# Patient Record
Sex: Female | Born: 1980 | Race: Black or African American | Hispanic: No | Marital: Single | Smoking: Never smoker
Health system: Southern US, Community
[De-identification: ages and names within clinical notes are randomized; demographics above are authoritative.]

## PROBLEM LIST (undated history)

## (undated) DIAGNOSIS — E669 Obesity, unspecified: Secondary | ICD-10-CM

## (undated) DIAGNOSIS — E039 Hypothyroidism, unspecified: Secondary | ICD-10-CM

## (undated) DIAGNOSIS — F32A Depression, unspecified: Secondary | ICD-10-CM

## (undated) DIAGNOSIS — F329 Major depressive disorder, single episode, unspecified: Secondary | ICD-10-CM

## (undated) HISTORY — DX: Hypothyroidism, unspecified: E03.9

## (undated) HISTORY — DX: Major depressive disorder, single episode, unspecified: F32.9

## (undated) HISTORY — DX: Depression, unspecified: F32.A

## (undated) HISTORY — DX: Obesity, unspecified: E66.9

## (undated) HISTORY — PX: CHOLECYSTECTOMY: SHX55

---

## 2005-05-05 ENCOUNTER — Emergency Department (HOSPITAL_COMMUNITY): Admission: EM | Admit: 2005-05-05 | Discharge: 2005-05-05 | Payer: Self-pay | Admitting: Family Medicine

## 2008-10-29 ENCOUNTER — Ambulatory Visit: Payer: Self-pay | Admitting: Family Medicine

## 2008-10-29 DIAGNOSIS — E039 Hypothyroidism, unspecified: Secondary | ICD-10-CM | POA: Insufficient documentation

## 2008-10-31 ENCOUNTER — Telehealth (INDEPENDENT_AMBULATORY_CARE_PROVIDER_SITE_OTHER): Payer: Self-pay | Admitting: *Deleted

## 2008-10-31 LAB — CONVERTED CEMR LAB
Albumin: 4 g/dL (ref 3.5–5.2)
Alkaline Phosphatase: 43 units/L (ref 39–117)
BUN: 9 mg/dL (ref 6–23)
Basophils Relative: 0.5 % (ref 0.0–3.0)
Calcium: 8.9 mg/dL (ref 8.4–10.5)
Creatinine, Ser: 0.9 mg/dL (ref 0.4–1.2)
Eosinophils Relative: 2.3 % (ref 0.0–5.0)
GFR calc Af Amer: 97 mL/min
Glucose, Bld: 103 mg/dL — ABNORMAL HIGH (ref 70–99)
HCT: 35.8 % — ABNORMAL LOW (ref 36.0–46.0)
HDL: 55.9 mg/dL (ref >39.0)
Hemoglobin: 12.2 g/dL (ref 12.0–15.0)
LDL Cholesterol: 90 mg/dL (ref 0–99)
MCV: 88.5 fL (ref 78.0–100.0)
Monocytes Absolute: 0.5 10*3/uL (ref 0.1–1.0)
Monocytes Relative: 5.6 % (ref 3.0–12.0)
Neutro Abs: 6.4 10*3/uL (ref 1.4–7.7)
Platelets: 444 10*3/uL — ABNORMAL HIGH (ref 150–400)
Potassium: 3.7 meq/L (ref 3.5–5.1)
RBC: 4.05 M/uL (ref 3.87–5.11)
Sodium: 140 meq/L (ref 135–145)
TSH: 67.19 microintl units/mL — ABNORMAL HIGH (ref 0.35–5.50)
Total Bilirubin: 0.6 mg/dL (ref 0.3–1.2)
Total CHOL/HDL Ratio: 3
Total Protein: 7 g/dL (ref 6.0–8.3)
WBC: 9.1 10*3/uL (ref 4.5–10.5)

## 2008-11-14 ENCOUNTER — Ambulatory Visit: Payer: Self-pay | Admitting: Family Medicine

## 2008-11-15 ENCOUNTER — Telehealth (INDEPENDENT_AMBULATORY_CARE_PROVIDER_SITE_OTHER): Payer: Self-pay | Admitting: *Deleted

## 2008-11-15 LAB — CONVERTED CEMR LAB
Free T4: 1.1 ng/dL (ref 0.6–1.6)
T3, Free: 2.6 pg/mL (ref 2.3–4.2)

## 2008-11-19 ENCOUNTER — Encounter: Payer: Self-pay | Admitting: Family Medicine

## 2008-12-12 ENCOUNTER — Ambulatory Visit: Payer: Self-pay | Admitting: Family Medicine

## 2008-12-13 ENCOUNTER — Telehealth (INDEPENDENT_AMBULATORY_CARE_PROVIDER_SITE_OTHER): Payer: Self-pay | Admitting: *Deleted

## 2008-12-13 LAB — CONVERTED CEMR LAB: TSH: 19.87 microintl units/mL — ABNORMAL HIGH (ref 0.35–5.50)

## 2009-01-23 ENCOUNTER — Encounter: Payer: Self-pay | Admitting: Family Medicine

## 2009-01-29 ENCOUNTER — Telehealth: Payer: Self-pay | Admitting: Family Medicine

## 2009-02-01 ENCOUNTER — Ambulatory Visit: Payer: Self-pay | Admitting: Family Medicine

## 2009-02-04 ENCOUNTER — Telehealth (INDEPENDENT_AMBULATORY_CARE_PROVIDER_SITE_OTHER): Payer: Self-pay | Admitting: *Deleted

## 2009-03-07 ENCOUNTER — Ambulatory Visit: Payer: Self-pay | Admitting: Family Medicine

## 2009-03-07 LAB — CONVERTED CEMR LAB: TSH: 2.91 microintl units/mL (ref 0.35–5.50)

## 2009-04-26 ENCOUNTER — Encounter (INDEPENDENT_AMBULATORY_CARE_PROVIDER_SITE_OTHER): Payer: Self-pay | Admitting: *Deleted

## 2009-04-26 ENCOUNTER — Encounter: Payer: Self-pay | Admitting: Family Medicine

## 2009-04-26 LAB — CONVERTED CEMR LAB: TSH: 6.82 microintl units/mL

## 2009-05-03 ENCOUNTER — Encounter (INDEPENDENT_AMBULATORY_CARE_PROVIDER_SITE_OTHER): Payer: Self-pay | Admitting: *Deleted

## 2009-06-13 ENCOUNTER — Ambulatory Visit: Payer: Self-pay | Admitting: Family Medicine

## 2009-06-18 ENCOUNTER — Telehealth (INDEPENDENT_AMBULATORY_CARE_PROVIDER_SITE_OTHER): Payer: Self-pay | Admitting: *Deleted

## 2009-08-29 ENCOUNTER — Encounter: Payer: Self-pay | Admitting: Internal Medicine

## 2009-12-04 ENCOUNTER — Ambulatory Visit: Payer: Self-pay | Admitting: Family

## 2009-12-04 DIAGNOSIS — R7309 Other abnormal glucose: Secondary | ICD-10-CM | POA: Insufficient documentation

## 2009-12-05 ENCOUNTER — Ambulatory Visit: Payer: Self-pay | Admitting: Family

## 2009-12-09 LAB — CONVERTED CEMR LAB
AST: 18 units/L (ref 0–37)
Albumin: 3.7 g/dL (ref 3.5–5.2)
Basophils Relative: 0.5 % (ref 0.0–3.0)
Calcium: 8.8 mg/dL (ref 8.4–10.5)
Chloride: 111 meq/L (ref 96–112)
Cholesterol: 109 mg/dL (ref 0–200)
Creatinine, Ser: 0.6 mg/dL (ref 0.4–1.2)
Eosinophils Relative: 1.9 % (ref 0.0–5.0)
HCT: 33 % — ABNORMAL LOW (ref 36.0–46.0)
HDL: 41.7 mg/dL (ref >39.00)
Hgb A1c MFr Bld: 5.8 % (ref 4.6–6.5)
LDL Cholesterol: 52 mg/dL (ref 0–99)
Lymphocytes Relative: 23.8 % (ref 12.0–46.0)
Monocytes Absolute: 0.7 10*3/uL (ref 0.1–1.0)
Monocytes Relative: 7.5 % (ref 3.0–12.0)
Neutrophils Relative %: 66.3 % (ref 43.0–77.0)
Platelets: 411 10*3/uL — ABNORMAL HIGH (ref 150.0–400.0)
RBC: 3.77 M/uL — ABNORMAL LOW (ref 3.87–5.11)
Sodium: 140 meq/L (ref 135–145)
TSH: 1.46 microintl units/mL (ref 0.35–5.50)
Total CHOL/HDL Ratio: 3
Triglycerides: 75 mg/dL (ref 0.0–149.0)
VLDL: 15 mg/dL (ref 0.0–40.0)
WBC: 8.7 10*3/uL (ref 4.5–10.5)

## 2010-04-23 ENCOUNTER — Ambulatory Visit: Payer: Self-pay | Admitting: Family Medicine

## 2010-04-23 DIAGNOSIS — F341 Dysthymic disorder: Secondary | ICD-10-CM | POA: Insufficient documentation

## 2010-04-24 ENCOUNTER — Ambulatory Visit: Payer: Self-pay | Admitting: Family Medicine

## 2010-04-29 ENCOUNTER — Telehealth (INDEPENDENT_AMBULATORY_CARE_PROVIDER_SITE_OTHER): Payer: Self-pay | Admitting: *Deleted

## 2010-04-29 LAB — CONVERTED CEMR LAB: TSH: 17.67 microintl units/mL — ABNORMAL HIGH (ref 0.35–5.50)

## 2010-06-03 ENCOUNTER — Ambulatory Visit: Payer: Self-pay | Admitting: Family Medicine

## 2010-06-04 LAB — CONVERTED CEMR LAB
Calcium: 8.7 mg/dL (ref 8.4–10.5)
Chloride: 106 meq/L (ref 96–112)
Creatinine, Ser: 0.6 mg/dL (ref 0.4–1.2)
Potassium: 4.2 meq/L (ref 3.5–5.1)

## 2010-08-22 ENCOUNTER — Ambulatory Visit: Payer: Self-pay | Admitting: Family Medicine

## 2010-09-02 ENCOUNTER — Encounter (INDEPENDENT_AMBULATORY_CARE_PROVIDER_SITE_OTHER): Payer: Self-pay | Admitting: *Deleted

## 2010-09-02 ENCOUNTER — Encounter: Payer: Self-pay | Admitting: Family Medicine

## 2010-09-03 ENCOUNTER — Encounter (INDEPENDENT_AMBULATORY_CARE_PROVIDER_SITE_OTHER): Payer: Self-pay | Admitting: *Deleted

## 2010-11-25 NOTE — Assessment & Plan Note (Signed)
Summary: 6 MONTH FOLLOWUP////SPH   Vital Signs:  Patient profile:   30 year old female Weight:      302 pounds Pulse rate:   96 / minute BP sitting:   114 / 78  (left arm)  Vitals Entered By: Doristine Devoid CMA (June 03, 2010 9:03 AM) CC: 6 month f/u    History of Present Illness: 30 yo woman here today for f/u on  1) depression- feels the Lexapro is 'definitely' helping.  'i'm just calmer'.  less crying.  2) Hypothyroid- went back to [facility], now on 50 and 200 micrograms daily.  this has improved pt's fatigue.  3) Obesity- has lost 9 lbs.  reports eating better, walking.  also now has thyroid under control.   Current Medications (verified): 1)  Synthroid 200 Mcg Tabs (Levothyroxine Sodium) .... Take 1 Tab Once Daily 2)  Synthroid 25 Mcg Tabs (Levothyroxine Sodium) .... Take One Tablet Daily 3)  Nuvaring 0.12-0.015 Mg/24hr Ring (Etonogestrel-Ethinyl Estradiol) .... As Directed 4)  Lexapro 10 Mg Tabs (Escitalopram Oxalate) .... Take 1 Tab By Mouth Daily  Allergies (verified): No Known Drug Allergies  Past History:  Past Medical History: hypothyroid obesity depression  Review of Systems      See HPI  Physical Exam  General:  morbidly obese female, NAD Neck:  No deformities, masses, or tenderness noted. No thyroid enlargement Lungs:  Normal respiratory effort, chest expands symmetrically. Lungs are clear to auscultation, no crackles or wheezes. Heart:  Normal rate and regular rhythm. S1 and S2 normal without gallop, murmur, click, rub or other extra sounds. Extremities:  No clubbing, cyanosis, edema, or deformity noted with normal full range of motion of all joints.   Psych:  Cognition and judgment appear intact. Alert and cooperative with normal attention span and concentration. No apparent delusions, illusions, hallucinations   Impression & Recommendations:  Problem # 1:  DEPRESSION/ANXIETY (ICD-300.4) Assessment Improved doing much better on Lexapro.   reports able to handle stressors better.  Problem # 2:  HYPOTHYROIDISM (ICD-244.9) Assessment: Unchanged dose adjusted by endo.  pt's sxs much improved. Her updated medication list for this problem includes:    Synthroid 200 Mcg Tabs (Levothyroxine sodium) .Marland Kitchen... Take 1 tab once daily    Synthroid 25 Mcg Tabs (Levothyroxine sodium) .Marland Kitchen... Take one tablet daily  Problem # 3:  OBESITY (ICD-278.00) Assessment: Unchanged pt has lost 9 lbs.  applauded her recent efforts.  Problem # 4:  HYPERGLYCEMIA, MILD (ICD-790.29) Assessment: Unchanged due for fasting labs today. Orders: Venipuncture (33295) TLB-BMP (Basic Metabolic Panel-BMET) (80048-METABOL)  Complete Medication List: 1)  Synthroid 200 Mcg Tabs (Levothyroxine sodium) .... Take 1 tab once daily 2)  Synthroid 25 Mcg Tabs (Levothyroxine sodium) .... Take one tablet daily 3)  Nuvaring 0.12-0.015 Mg/24hr Ring (Etonogestrel-ethinyl estradiol) .... As directed 4)  Lexapro 10 Mg Tabs (Escitalopram oxalate) .... Take 1 tab by mouth daily  Patient Instructions: 1)  Please schedule your complete physical in February 2)  We'll notify you of your lab results 3)  Call for any refills 4)  ENJOY YOUR PARTY!!!

## 2010-11-25 NOTE — Progress Notes (Signed)
Summary: labs  Phone Note Outgoing Call   Call placed by: Doristine Devoid,  April 29, 2010 9:47 AM Call placed to: Patient Summary of Call: thyroid again abnormal.  please verify she is taking meds regularly.  if she is- will likely need to schedule an appt w/ her endocrinologist as her level is again varying dramatically.  Follow-up for Phone Call        left message on machine ........Marland KitchenDoristine Devoid  April 29, 2010 9:47 AM   spoke w/ patient informed that she needs to f/u w/ endocrinology she also stated that she has been taking medications daily patient agreed and will fax information to [facility].........Marland KitchenDoristine Devoid  April 29, 2010 4:34 PM

## 2010-11-25 NOTE — Assessment & Plan Note (Signed)
Summary: followup on med/kn   Vital Signs:  Patient profile:   30 year old female Weight:      308 pounds Pulse rate:   98 / minute BP sitting:   124 / 80  (left arm)  Vitals Entered By: Doristine Devoid CMA (August 22, 2010 9:02 AM) CC: f/u on meds  Comments would like to increase Lexapro    History of Present Illness: 30 yo woman here today to f/u depression/anxiety.  feels sxs are improving but aren't quite there.  would like to increase to 20mg .  crying less.  less quick to anger.  doesn't feel as good as when she first started meds, 'i'm back sliding a little'.  obesity- pt now on 'duke and diet' plan.  uncomfortable with her current weight.  would like to know my thoughts on lap band surgery.  Current Medications (verified): 1)  Synthroid 200 Mcg Tabs (Levothyroxine Sodium) .... Take 1 Tab Once Daily 2)  Synthroid 100 Mcg Tabs (Levothyroxine Sodium) .... Take One Tablet Daily 3)  Nuvaring 0.12-0.015 Mg/24hr Ring (Etonogestrel-Ethinyl Estradiol) .... As Directed 4)  Lexapro 20 Mg Tabs (Escitalopram Oxalate) .Marland Kitchen.. 1 By Mouth Daily  Allergies (verified): No Known Drug Allergies  Past History:  Past Medical History: Last updated: 06/03/2010 hypothyroid obesity depression  Review of Systems      See HPI  Physical Exam  General:  morbidly obese female, NAD Psych:  Cognition and judgment appear intact. Alert and cooperative with normal attention span and concentration. No apparent delusions, illusions, hallucinations   Impression & Recommendations:  Problem # 1:  DEPRESSION/ANXIETY (ICD-300.4) Assessment Unchanged will increase to 20mg  based on pt's reports of 'backsliding'.  pt to call if no improvement.  Problem # 2:  OBESITY (ICD-278.00) Assessment: Unchanged discussed mechanics of lap band and reviewed the differences between this and gastric bypass.  pt to call [facility] for more info.  Complete Medication List: 1)  Synthroid 200 Mcg Tabs  (Levothyroxine sodium) .... Take 1 tab once daily 2)  Synthroid 100 Mcg Tabs (Levothyroxine sodium) .... Take one tablet daily 3)  Nuvaring 0.12-0.015 Mg/24hr Ring (Etonogestrel-ethinyl estradiol) .... As directed 4)  Lexapro 20 Mg Tabs (Escitalopram oxalate) .Marland Kitchen.. 1 by mouth daily  Patient Instructions: 1)  Schedule your complete physical in February- do not eat before this appt 2)  Take 2 of the Lexapro that you have at home and it will only be 1 pill when you pick up your new prescription 3)  Consider [facility] Diet 4)  [facility] does the lap band procedure if you're interested in more info 5)  You look great! 6)  Have a great holiday season!!! Prescriptions: LEXAPRO 20 MG TABS (ESCITALOPRAM OXALATE) 1 by mouth daily  #30 x 3   Entered and Authorized by:   Neena Rhymes MD   Signed by:   Neena Rhymes MD on 08/22/2010   Method used:   [facility] to        [facility] [phone]* (retail)       [address]       [place], [place]  [address]       Ph: [phone]       Fax: [phone]   RxID:   [id number]    Orders Added: 1)  Est. Patient Level III [34742]

## 2010-11-25 NOTE — Assessment & Plan Note (Signed)
Summary: cpx- jr   Vital Signs:  Patient profile:   30 year old female Height:      66 inches Weight:      299 pounds BMI:     48.43 Pulse rate:   72 / minute BP sitting:   124 / 72  (left arm)  Vitals Entered By: Doristine Devoid (December 04, 2009 9:53 AM) CC: CPX AND LABS    CC:  CPX AND LABS .  History of Present Illness: Joanne Harris is a 30 year old female who presents today for a complete physical, also wants to have her thyroid checked.    Preventative- Has her PAP done with Dr.  Johney Frame, due for Pap, patient will schedule.  Patient not certain of last tetanus shot was.  Exercises 1x a week.  Doing weight watchers.    Allergies: No Known Drug Allergies  Family History: Reviewed history from 11/14/2008 and no changes required. CAD-no HTN-mother, grandmother DM-grandmother STOKE-grandfather COLON CA-no BREAST CA-grandmother dx'd late 58s Mom- anemia  Social History: Reviewed history from 11/14/2008 and no changes required. Single Never Smoked Alcohol use-yes-occassional once a month. 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 Resp: Denies cough CV:  Denies Chest Pain GI:  Denies nausea or vomitting GU: Denies dysuria Lymphatic: Denies lymphadenopathy Musculoskeletal:  Denies muscle/joint pain Skin:  Denies Rashes, had two moles removed 2 weeks ago from face- benign Psychiatric: Denies depression,  notes occasional anxiety related to currently being single- best friend is getting married.   Neuro: Denies numbness     Physical Exam  General:  morbidly obese female, NAD Head:  Normocephalic and atraumatic without obvious abnormalities. No apparent alopecia or balding. Eyes:  PERRLA Ears:  External ear exam shows no significant lesions or deformities.  Otoscopic examination reveals clear canals, tympanic membranes are intact bilaterally  without bulging, retraction, inflammation or discharge. Hearing is grossly normal bilaterally. Mouth:  Oral mucosa and oropharynx without lesions or exudates.  Teeth in good repair. Neck:  No deformities, masses, or tenderness noted. No thyroid enlargement Breasts:  declined Lungs:  Normal respiratory effort, chest expands symmetrically. Lungs are clear to auscultation, no crackles or wheezes. Heart:  Normal rate and regular rhythm. S1 and S2 normal without gallop, murmur, click, rub or other extra sounds. Abdomen:  Bowel sounds positive,abdomen soft and non-tender without masses, organomegaly or hernias noted. Genitalia:  deferred to GYN Msk:  normal ROM, no joint tenderness, and no joint swelling.   Extremities:  No clubbing, cyanosis, edema, or deformity noted with normal full range of motion of all joints.   Neurologic:  alert & oriented X3, cranial nerves II-XII intact, strength normal in all extremities, and gait normal.   Skin:  scar from former mole on right cheek and above nose.   Cervical Nodes:  No lymphadenopathy noted Psych:  Cognition and judgment appear intact. Alert and cooperative with normal attention span and concentration. No apparent delusions, illusions, hallucinations   Impression & Recommendations:  Problem # 1:  HEALTHY ADULT FEMALE (ICD-V70.0) Assessment Comment Only Immunizations reviewed, patient counselled on diet, exercise and weight loss Orders: TLB-BMP (Basic Metabolic Panel-BMET) (80048-METABOL) TLB-CBC Platelet - w/Differential (85025-CBCD) TLB-Lipid Panel (80061-LIPID)  Problem # 2:  HYPOTHYROIDISM (ICD-244.9) will check TSH today.   Her updated medication list for this problem includes:    Synthroid 200 Mcg Tabs (Levothyroxine  sodium) .Marland Kitchen... Take 1 tab once daily    Synthroid 25 Mcg Tabs (Levothyroxine sodium) .Marland Kitchen... Take one tablet daily  Orders: Venipuncture (33295) TLB-TSH (Thyroid Stimulating Hormone) (84443-TSH)  Problem # 3:  HYPERGLYCEMIA,  MILD (ICD-790.29) noted mild hyperglycemia on last year's labs, will repeat fasting glucose and will also check A1C to further evaluate and r/o DM. Orders: T-Hemoglobin A1C (18841)  Problem # 4:  OBESITY (ICD-278.00) Assessment: Improved Has lost a few pounds- patient encouraged to continue weight watchers.  She is considering looking further into Lap Band surgery, but wants to do weight watchers a little longer first.   Orders: TLB-Hepatic/Liver Function Pnl (80076-HEPATIC)  Complete Medication List: 1)  Synthroid 200 Mcg Tabs (Levothyroxine sodium) .... Take 1 tab once daily 2)  Synthroid 25 Mcg Tabs (Levothyroxine sodium) .... Take one tablet daily  Patient Instructions: 1)  Please complete lab work this AM before leaving. 2)  Call GYN to schedule your pap smear. 3)  Keep up the good work with diet. 4)  It is important that you exercise regularly at least 20 minutes 5 times a week. If you develop chest pain, have severe difficulty breathing, or feel very tired , stop exercising immediately and seek medical attention. 5)  Please schedule a follow-up appointment in 6 months.

## 2010-11-25 NOTE — Miscellaneous (Signed)
  Clinical Lists Changes  Observations: Added new observation of TSH: 0.82 microintl units/mL (09/02/2010 15:39)

## 2010-11-25 NOTE — Assessment & Plan Note (Signed)
Summary: anxiety//kn/RESCD CBS   Vital Signs:  Patient profile:   30 year old female Weight:      311 pounds Pulse rate:   100 / minute BP sitting:   144 / 80  (left arm)  Vitals Entered By: Doristine Devoid (April 23, 2010 9:44 AM) CC: anxiety xyears getting worse lately    History of Present Illness: 30 yo woman here today to discuss anxiety.  sxs started in 2004 but 'i just kinda deal w/ it'.  has had increased workload due to recent layoffs.  has been dealing w/ stress by eating.  waking up at night nervous.  withdrawing from things she used to enjoy.  crying frequently.  Problems Prior to Update: 1)  Depression/anxiety  (ICD-300.4) 2)  Hyperglycemia, Mild  (ICD-790.29) 3)  Healthy Adult Female  (ICD-V70.0) 4)  Hypothyroidism  (ICD-244.9) 5)  Obesity  (ICD-278.00)  Current Medications (verified): 1)  Synthroid 200 Mcg Tabs (Levothyroxine Sodium) .... Take 1 Tab Once Daily 2)  Synthroid 25 Mcg Tabs (Levothyroxine Sodium) .... Take One Tablet Daily 3)  Nuvaring 0.12-0.015 Mg/24hr Ring (Etonogestrel-Ethinyl Estradiol) .... As Directed 4)  Lexapro 10 Mg Tabs (Escitalopram Oxalate) .... Take 1 Tab By Mouth Daily  Allergies (verified): No Known Drug Allergies  Past History:  Past Medical History: Last updated: 11/14/2008 hypothyroid obesity  Social History: Last updated: 12/04/2009 Single Never Smoked Alcohol use-yes-occassional once a month. 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  start SSRI.  encouraged counseling- #s given.  will follow closely.  Problem # 2:  HYPOTHYROIDISM (ICD-244.9) Assessment: Unchanged check to ensure she's not over medicated  as this could contribute to her anxiety. Her updated medication list for this problem includes:    Synthroid 200 Mcg Tabs (Levothyroxine sodium) .Marland Kitchen... Take 1 tab once daily    Synthroid 25 Mcg Tabs (Levothyroxine sodium) .Marland Kitchen... Take one tablet daily  Orders: Venipuncture (14782) TLB-TSH (Thyroid Stimulating Hormone) (84443-TSH)  Complete Medication List: 1)  Synthroid 200 Mcg Tabs (Levothyroxine sodium) .... Take 1 tab once daily 2)  Synthroid 25 Mcg Tabs (Levothyroxine sodium) .... Take one tablet daily 3)  Nuvaring 0.12-0.015 Mg/24hr Ring (Etonogestrel-ethinyl estradiol) .... As directed 4)  Lexapro 10 Mg Tabs (Escitalopram oxalate) .... Take 1 tab by mouth daily  Patient Instructions: 1)  Please change your physical appt from Melissa to [facility] on 8/8 at 8am 2)  We'll notify you of your lab results 3)  Start the Lexapro- 1 daily 4)  Consider starting therapy as an outlet for these feelings 5)  Try and get regular physical activity- not just for weight loss but as an outlet 6)  Hang in there!  You are normal! Prescriptions: LEXAPRO 10 MG TABS (ESCITALOPRAM OXALATE) Take 1 tab by mouth daily  #30 x 3   Entered and Authorized by:   Neena Rhymes MD   Signed by:   Neena Rhymes MD on 04/23/2010   Method used:   [facility]        [facility] [phone]* (retail)       [address]       [place], [place]  [address]       Ph: [phone]       Fax: [phone]   RxID:   [id number]

## 2011-01-02 ENCOUNTER — Encounter: Payer: Self-pay | Admitting: Family Medicine

## 2011-01-02 ENCOUNTER — Encounter (INDEPENDENT_AMBULATORY_CARE_PROVIDER_SITE_OTHER): Payer: PRIVATE HEALTH INSURANCE | Admitting: Family Medicine

## 2011-01-02 ENCOUNTER — Other Ambulatory Visit: Payer: Self-pay | Admitting: Family Medicine

## 2011-01-02 DIAGNOSIS — E039 Hypothyroidism, unspecified: Secondary | ICD-10-CM

## 2011-01-02 DIAGNOSIS — Z0000 Encounter for general adult medical examination without abnormal findings: Secondary | ICD-10-CM

## 2011-01-02 DIAGNOSIS — E669 Obesity, unspecified: Secondary | ICD-10-CM

## 2011-01-02 LAB — HEPATIC FUNCTION PANEL
ALT: 15 U/L (ref 0–35)
Total Bilirubin: 0.2 mg/dL — ABNORMAL LOW (ref 0.3–1.2)
Total Protein: 6.9 g/dL (ref 6.0–8.3)

## 2011-01-02 LAB — CBC WITH DIFFERENTIAL/PLATELET
Basophils Absolute: 0.1 10*3/uL (ref 0.0–0.1)
Basophils Relative: 0.6 % (ref 0.0–3.0)
Eosinophils Absolute: 0.2 10*3/uL (ref 0.0–0.7)
HCT: 30.6 % — ABNORMAL LOW (ref 36.0–46.0)
Lymphocytes Relative: 20.3 % (ref 12.0–46.0)
Lymphs Abs: 2.2 10*3/uL (ref 0.7–4.0)
MCHC: 32.9 g/dL (ref 30.0–36.0)
MCV: 81.9 fl (ref 78.0–100.0)
Monocytes Absolute: 0.7 10*3/uL (ref 0.1–1.0)
Monocytes Relative: 6.1 % (ref 3.0–12.0)
Neutrophils Relative %: 71 % (ref 43.0–77.0)
Platelets: 462 10*3/uL — ABNORMAL HIGH (ref 150.0–400.0)
RDW: 17.7 % — ABNORMAL HIGH (ref 11.5–14.6)

## 2011-01-02 LAB — BASIC METABOLIC PANEL
BUN: 16 mg/dL (ref 6–23)
Chloride: 105 mEq/L (ref 96–112)
Creatinine, Ser: 0.8 mg/dL (ref 0.4–1.2)
GFR: 116.29 mL/min (ref >60.00)
Glucose, Bld: 101 mg/dL — ABNORMAL HIGH (ref 70–99)
Potassium: 4.1 mEq/L (ref 3.5–5.1)
Sodium: 136 mEq/L (ref 135–145)

## 2011-01-02 LAB — LIPID PANEL
Cholesterol: 155 mg/dL (ref 0–200)
Triglycerides: 96 mg/dL (ref 0.0–149.0)
VLDL: 19.2 mg/dL (ref 0.0–40.0)

## 2011-01-05 LAB — CONVERTED CEMR LAB: Vit D, 25-Hydroxy: 18 ng/mL — ABNORMAL LOW (ref 30–89)

## 2011-01-06 NOTE — Assessment & Plan Note (Signed)
Summary: CPX,FASTING,MEDCOST/RH.....   Vital Signs:  Patient profile:   30 year old female Height:      66 inches (167.64 cm) Weight:      335.38 pounds (152.45 kg) BMI:     54.33 Temp:     98.8 degrees F (37.11 degrees C) oral BP sitting:   132 / 88  (left arm) Cuff size:   large  Vitals Entered By: Lucious Groves CMA (January 02, 2011 8:11 AM) CC: CPX no pap./kb Is Patient Diabetic? No Pain Assessment Patient in pain? no        History of Present Illness: 30 yo woman here today for CPE.  GYN for pap.    Obesity- has gained considerable weight (27 lbs) since fall.  has been working late and eating fast food frequently.  not exercising.  very upset about her weight.  Preventive Screening-Counseling & Management  Alcohol-Tobacco     Alcohol drinks/day: <1     Smoking Status: never  Caffeine-Diet-Exercise     Does Patient Exercise: no  Current Medications (verified): 1)  Lexapro 20 Mg Tabs (Escitalopram Oxalate) .Marland Kitchen.. 1 By Mouth Daily 2)  Synthroid 300 Mcg Tabs (Levothyroxine Sodium) .Marland Kitchen.. 1 By Mouth Once Daily  Allergies (verified): No Known Drug Allergies  Past History:  Past medical, surgical, family and social histories (including risk factors) reviewed, and no changes noted (except as noted below).  Past Medical History: Reviewed history from 06/03/2010 and no changes required. hypothyroid obesity depression  Past Surgical History: Reviewed history from 10/29/2008 and no changes required. Cholecystectomy  Family History: Reviewed history from 12/04/2009 and no changes required. CAD-no HTN-mother, grandmother DM-grandmother STOKE-grandfather COLON CA-no BREAST CA-grandmother dx'd late 67s Mom- anemia  Social History: Reviewed history from 12/04/2009 and no changes required. Single Never Smoked Alcohol use-yes-occassional once a month. 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  The patient denies anorexia, fever, weight loss, vision loss, decreased hearing, hoarseness, chest pain, syncope, prolonged cough, headaches, hemoptysis, abdominal pain, melena, hematochezia, severe indigestion/heartburn, hematuria, suspicious skin lesions, depression, abnormal bleeding, enlarged lymph nodes, and breast masses.    Physical Exam  General:  morbidly obese female, NAD Head:  Normocephalic and atraumatic without obvious abnormalities. No apparent alopecia or balding. Eyes:  No corneal or conjunctival inflammation noted. EOMI. Perrla. Funduscopic exam benign, without hemorrhages, exudates or papilledema. Vision grossly normal.  + exophthalmos Ears:  External ear exam shows no significant lesions or deformities.  Otoscopic examination reveals clear canals, tympanic membranes are intact bilaterally without bulging, retraction, inflammation or discharge. Hearing is grossly normal bilaterally. Nose:  External nasal examination shows no deformity or inflammation. Nasal mucosa are pink and moist without lesions or exudates. Mouth:  Oral mucosa and oropharynx without lesions or exudates.  Teeth in good repair. Neck:  No deformities, masses, or tenderness noted. No thyroid enlargement Breasts:  gyn Lungs:  Normal respiratory effort, chest expands symmetrically. Lungs are clear to auscultation, no crackles or wheezes. Heart:  Normal rate and regular rhythm. S1 and S2 normal without gallop, murmur, click, rub or other extra sounds. Abdomen:  Bowel sounds positive,abdomen soft and non-tender without masses, organomegaly or hernias noted. Genitalia:  gyn Pulses:  +2 carotid, radial, DP Extremities:  no C/C/E Neurologic:  No cranial nerve deficits noted. Station and gait are normal. Plantar reflexes are down-going bilaterally.  DTRs are symmetrical throughout. Sensory, motor and coordinative functions appear  intact. Skin:  Intact without suspicious lesions or rashes Cervical Nodes:  No lymphadenopathy noted Psych:  Cognition and judgment appear intact. Alert and cooperative with normal attention span and concentration. No apparent delusions, illusions, hallucinations   Impression & Recommendations:  Problem # 1:  HEALTHY ADULT FEMALE (ICD-V70.0) Assessment Unchanged pt's PE WNL w/ exception of obesity (see below).  check labs.  anticipatory guidance provided. Orders: T-Vitamin D (25-Hydroxy) [phone])  Problem # 2:  OBESITY (ICD-278.00) Assessment: Deteriorated discussed possibility of lap band surgery.  info given on seminar and website to review.  encouraged healthier diet choices and regular exercise.  will follow. Orders: TLB-Lipid Panel (80061-LIPID) TLB-BMP (Basic Metabolic Panel-BMET) (80048-METABOL) TLB-CBC Platelet - w/Differential (85025-CBCD) TLB-Hepatic/Liver Function Pnl (80076-HEPATIC) Specimen Handling (56213)  Complete Medication List: 1)  Lexapro 20 Mg Tabs (Escitalopram oxalate) .Marland Kitchen.. 1 by mouth daily 2)  Synthroid 300 Mcg Tabs (Levothyroxine sodium) .Marland Kitchen.. 1 by mouth once daily  Other Orders: Venipuncture (08657) TLB-TSH (Thyroid Stimulating Hormone) (84443-TSH)  Patient Instructions: 1)  Follow up in 2 months to recheck BP 2)  Your exam looks good 3)  We'll notify you of your lab results 4)  Try and limit your salt intake 5)  Decrease your fast food habit- consider [employer] 6)  Check out the lap band procedure info at [email or web address] 7)  There is an info session on Tuesday 3/13 at 6pm at [facility] Classroom 1 8)  Call with any questions or concerns 9)  Hang in there!!!   Orders Added: 1)  Venipuncture [36415] 2)  TLB-TSH (Thyroid Stimulating Hormone) [84443-TSH] 3)  TLB-Lipid Panel [80061-LIPID] 4)  TLB-BMP (Basic Metabolic Panel-BMET) [80048-METABOL] 5)  TLB-CBC Platelet - w/Differential [85025-CBCD] 6)  TLB-Hepatic/Liver  Function Pnl [80076-HEPATIC] 7)  T-Vitamin D (25-Hydroxy) [84696-29528] 8)  Specimen Handling [99000] 9)  Est. Patient 18-39 years [99395] 10)  Est. Patient Level II [41324]

## 2011-02-20 ENCOUNTER — Encounter: Payer: Self-pay | Admitting: Family Medicine

## 2011-03-04 ENCOUNTER — Ambulatory Visit: Payer: PRIVATE HEALTH INSURANCE | Admitting: Family Medicine

## 2011-03-19 ENCOUNTER — Telehealth: Payer: Self-pay | Admitting: Family Medicine

## 2011-03-19 MED ORDER — ESCITALOPRAM OXALATE 20 MG PO TABS: 20.0000 mg | ORAL_TABLET | Freq: Every day | ORAL | Status: DC

## 2011-03-19 NOTE — Telephone Encounter (Signed)
Ok for #30, 6 refills 

## 2011-03-19 NOTE — Telephone Encounter (Signed)
Rx sent 

## 2011-03-19 NOTE — Telephone Encounter (Signed)
Last filled 08-22-10 #30 3, last OV 01-02-11

## 2011-03-25 ENCOUNTER — Encounter: Payer: Self-pay | Admitting: Family Medicine

## 2011-03-25 ENCOUNTER — Ambulatory Visit (INDEPENDENT_AMBULATORY_CARE_PROVIDER_SITE_OTHER): Payer: PRIVATE HEALTH INSURANCE | Admitting: Family Medicine

## 2011-03-25 DIAGNOSIS — F341 Dysthymic disorder: Secondary | ICD-10-CM

## 2011-03-25 DIAGNOSIS — E669 Obesity, unspecified: Secondary | ICD-10-CM

## 2011-03-25 DIAGNOSIS — IMO0001 Reserved for inherently not codable concepts without codable children: Secondary | ICD-10-CM | POA: Insufficient documentation

## 2011-03-25 DIAGNOSIS — R030 Elevated blood-pressure reading, without diagnosis of hypertension: Secondary | ICD-10-CM

## 2011-03-25 MED ORDER — CITALOPRAM HYDROBROMIDE 40 MG PO TABS: ORAL_TABLET | ORAL | Status: DC

## 2011-03-25 NOTE — Progress Notes (Signed)
  Subjective:    Patient ID: Joanne Harris, female    DOB: 12-May-1981, 30 y.o.   MRN: [id number]  HPI Elevated BP- BP was slightly elevated at last visit.  Excellent today.  No CP, SOB, HAs, visual changes, edema.  Depression- chronic problem for pt, has been taking 2 of the Lexapro 20mg  for a total of 40mg  (20mg  is max).  Reports feeling 'better' on the increased dose.  Was still having some anxiety at the 20mg .    Obesity- has maintained weight from previous visit.  Eating less fast food- cooking more at home and eating [employer].  Drinking more water.     Review of Systems For ROS see HPI     Objective:   Physical Exam  Constitutional: She is oriented to person, place, and time. She appears well-developed and well-nourished. No distress.       Morbidly obese  HENT:  Head: Normocephalic and atraumatic.  Neck: Normal range of motion. Neck supple.  Cardiovascular: Normal rate, regular rhythm, normal heart sounds and intact distal pulses.   No murmur heard. Pulmonary/Chest: Effort normal and breath sounds normal. No respiratory distress. She has no wheezes.  Musculoskeletal: She exhibits no edema.  Neurological: She is alert and oriented to person, place, and time. No cranial nerve deficit. Coordination normal.  Skin: Skin is warm and dry.  Psychiatric: She has a normal mood and affect. Her behavior is normal.          Assessment & Plan:

## 2011-03-25 NOTE — Assessment & Plan Note (Signed)
Pt has maintained her weight- which is an improvement over gaining.  Applauded her choice to limit fast food.  Encouraged her to add regular exercise.  Will continue to follow.

## 2011-03-25 NOTE — Assessment & Plan Note (Signed)
Pt's BP is excellent today.  No evidence of HTN.  Reviewed importance of healthy diet and regular exercise.  Will follow.

## 2011-03-25 NOTE — Patient Instructions (Signed)
Follow up in 2 months to recheck mood and weight loss progress Keep up the good work on healthy food choices!  I'm so proud of you! STOP the Lexapro START the Celexa Call with any questions or concerns Have a great summer!!!

## 2011-03-25 NOTE — Assessment & Plan Note (Signed)
Rather than having pt take twice the recommended max dose of Lexapro will switch to Celexa.  Pt expressed understanding and is in agreement w/ plan.

## 2011-05-08 ENCOUNTER — Ambulatory Visit (INDEPENDENT_AMBULATORY_CARE_PROVIDER_SITE_OTHER): Payer: PRIVATE HEALTH INSURANCE | Admitting: Family Medicine

## 2011-05-08 ENCOUNTER — Encounter: Payer: Self-pay | Admitting: Family Medicine

## 2011-05-08 DIAGNOSIS — E669 Obesity, unspecified: Secondary | ICD-10-CM

## 2011-05-08 DIAGNOSIS — M549 Dorsalgia, unspecified: Secondary | ICD-10-CM | POA: Insufficient documentation

## 2011-05-08 DIAGNOSIS — F341 Dysthymic disorder: Secondary | ICD-10-CM

## 2011-05-08 MED ORDER — CYCLOBENZAPRINE HCL 10 MG PO TABS: 10.0000 mg | ORAL_TABLET | Freq: Three times a day (TID) | ORAL | Status: DC | PRN

## 2011-05-08 MED ORDER — NAPROXEN 500 MG PO TABS: 500.0000 mg | ORAL_TABLET | Freq: Two times a day (BID) | ORAL | Status: DC

## 2011-05-08 NOTE — Progress Notes (Signed)
  Subjective:    Patient ID: Joanne Harris, female    DOB: [birth year], 30 y.o.   MRN: [id number]  HPI Depression- chronic problem, mood is improving on the Celexa.  She feels this is working better than the Lexapro.  Obesity- chronic problem, has lost ~7 lbs since last visit.  Exercising, watching diet- attempting to eat more fruits and veggies.  Back pain- L lower back pain, injured area in 3rd grade.  Has had intermittent pain in that area since.  Recently she has been having increased frequency of pain.  Monday night had severe pain- radiating down L leg.  Will have some tingling of L leg.  Prior to pain had done a lot of walking in flip-flops.   Review of Systems For ROS see HPI     Objective:   Physical Exam  Vitals reviewed. Constitutional: She is oriented to person, place, and time. She appears well-developed and well-nourished. No distress.       obese  Musculoskeletal: She exhibits tenderness (L lumbar paraspinal muscles).       + SLR on L Limited forward flexion and extension Strength normal, sensation normal, reflexes +2 and symmetric  Neurological: She is alert and oriented to person, place, and time.  Psychiatric: She has a normal mood and affect. Her behavior is normal. Judgment and thought content normal.          Assessment & Plan:

## 2011-05-08 NOTE — Patient Instructions (Signed)
Follow up in 3 months to check on weight loss progress Take the Naproxen for the next 3-5 days for back pain- take w/ food. Use the muscle relaxer at night as needed for pain relief Alternate heat or ice- whichever feels better- for back pain Continue to be active- this will avoid stiffness Keep up the good work on diet and exercise- i'm proud of you! Have a great summer!

## 2011-05-08 NOTE — Assessment & Plan Note (Signed)
Pt feels mood has improved since switching from Lexapro to Celexa.  Will continue at current dose.

## 2011-05-08 NOTE — Assessment & Plan Note (Signed)
Pt has lost 7 lbs since last visit.  Applauded her efforts.  Will continue to follow.

## 2011-05-08 NOTE — Assessment & Plan Note (Signed)
Pt's pain consistent w/ muscular LBP- strain vs spasm.  Start NSAIDs, flexeril.  No red flags on hx or PE.  Reviewed supportive care and red flags that should prompt return.  Pt expressed understanding and is in agreement w/ plan.

## 2011-05-25 ENCOUNTER — Ambulatory Visit: Payer: PRIVATE HEALTH INSURANCE | Admitting: Family Medicine

## 2011-06-02 ENCOUNTER — Other Ambulatory Visit: Payer: Self-pay | Admitting: Family Medicine

## 2011-11-12 ENCOUNTER — Encounter: Payer: PRIVATE HEALTH INSURANCE | Admitting: Family Medicine

## 2011-12-09 ENCOUNTER — Encounter: Payer: PRIVATE HEALTH INSURANCE | Admitting: Family Medicine

## 2011-12-09 DIAGNOSIS — Z0289 Encounter for other administrative examinations: Secondary | ICD-10-CM

## 2011-12-24 ENCOUNTER — Telehealth: Payer: Self-pay | Admitting: Family Medicine

## 2011-12-24 MED ORDER — SYNTHROID 300 MCG PO TABS: 300.0000 ug | ORAL_TABLET | Freq: Every day | ORAL | Status: DC

## 2011-12-24 NOTE — Telephone Encounter (Signed)
Refill: Synthroid 300 mcg tablet. Take 1 tablet daily as directed. Qty 30. Last fill 11-18-11

## 2011-12-24 NOTE — Telephone Encounter (Signed)
Called pt to clarify if she is still seeing an endo, pt advised that she had seen her 6 months ago, advised that we can send her #30 day supply of the synthroid to [facility] but that she needs an OV before any further medication can be filled per no OV noted since 7-12 and no labs noted since 3-12, pt understood and stated she will call back to schedule after she looks at her availability

## 2011-12-24 NOTE — Telephone Encounter (Signed)
Ok for #30, needs to schedule OV before additional refills given

## 2011-12-24 NOTE — Telephone Encounter (Signed)
Please note last OV 04-28-11 for back pain, last labs 3-12, note pt last OV 12-09-11 a no show, prior 2 OV were noted as canceled via pt, please advise if ok to send #30 with a letter to acknowledge

## 2012-03-08 ENCOUNTER — Telehealth: Payer: Self-pay | Admitting: Family Medicine

## 2012-03-08 NOTE — Telephone Encounter (Signed)
We don't have any recent thyroid labs- can't refill med w/out knowing if dose is correct.  If she has seen Endo more recently than 1 yr ago, they need to refill meds b/c they would have accurate lab info

## 2012-03-08 NOTE — Telephone Encounter (Signed)
.  left message to have patient return my call to clarify if the medication refill should be sent to her endo MD per noted last labs on 12-2010

## 2012-03-08 NOTE — Telephone Encounter (Signed)
refill for Synthroid 300 MCG Tablet Qty 30 Take one tablet by mouth daily Last filled 02.28.13  Last real ov 7.13.12, future appt 7.9.13 Cancelled the following 7.13012, 11-12-11 & NO SHOW 2.13.13

## 2012-03-08 NOTE — Telephone Encounter (Signed)
Pt return call stating that she does see endo but will not see them for another 6 month to f/u Pt indicated that Dr Beverely Low has filled this med in the past so is unsure why there is a issue now. .Please advise

## 2012-03-09 NOTE — Telephone Encounter (Signed)
Pt home number notes not accepting calls at this time will call back later

## 2012-03-10 NOTE — Telephone Encounter (Signed)
FYI: Pt noted that she has a follow up apt set up with her Endo MD and that he sent in a 30 day supply for her til she could get in his office

## 2012-05-03 ENCOUNTER — Ambulatory Visit (INDEPENDENT_AMBULATORY_CARE_PROVIDER_SITE_OTHER): Payer: PRIVATE HEALTH INSURANCE | Admitting: Family Medicine

## 2012-05-03 ENCOUNTER — Encounter: Payer: Self-pay | Admitting: Family Medicine

## 2012-05-03 VITALS — BP 125/78 | Temp 98.6°F | Ht 64.75 in | Wt 356.0 lb

## 2012-05-03 DIAGNOSIS — E669 Obesity, unspecified: Secondary | ICD-10-CM

## 2012-05-03 DIAGNOSIS — Z0000 Encounter for general adult medical examination without abnormal findings: Secondary | ICD-10-CM | POA: Insufficient documentation

## 2012-05-03 DIAGNOSIS — F341 Dysthymic disorder: Secondary | ICD-10-CM

## 2012-05-03 DIAGNOSIS — E039 Hypothyroidism, unspecified: Secondary | ICD-10-CM

## 2012-05-03 LAB — CBC WITH DIFFERENTIAL/PLATELET
Basophils Absolute: 0 10*3/uL (ref 0.0–0.1)
Basophils Relative: 0.4 % (ref 0.0–3.0)
Eosinophils Relative: 1.9 % (ref 0.0–5.0)
HCT: 31.2 % — ABNORMAL LOW (ref 36.0–46.0)
Lymphs Abs: 1.8 10*3/uL (ref 0.7–4.0)
MCHC: 31.7 g/dL (ref 30.0–36.0)
MCV: 78.7 fl (ref 78.0–100.0)
Monocytes Absolute: 0.5 10*3/uL (ref 0.1–1.0)
Monocytes Relative: 4.9 % (ref 3.0–12.0)
Neutrophils Relative %: 74.6 % (ref 43.0–77.0)
Platelets: 532 10*3/uL — ABNORMAL HIGH (ref 150.0–400.0)
RBC: 3.96 Mil/uL (ref 3.87–5.11)
WBC: 9.8 10*3/uL (ref 4.5–10.5)

## 2012-05-03 LAB — LIPID PANEL
HDL: 43.5 mg/dL (ref >39.00)
Total CHOL/HDL Ratio: 3
VLDL: 19 mg/dL (ref 0.0–40.0)

## 2012-05-03 LAB — HEPATIC FUNCTION PANEL
AST: 26 U/L (ref 0–37)
Albumin: 4.1 g/dL (ref 3.5–5.2)
Alkaline Phosphatase: 56 U/L (ref 39–117)
Total Protein: 7.8 g/dL (ref 6.0–8.3)

## 2012-05-03 LAB — BASIC METABOLIC PANEL
CO2: 25 mEq/L (ref 19–32)
Chloride: 102 mEq/L (ref 96–112)
GFR: 135.57 mL/min (ref >60.00)
Glucose, Bld: 132 mg/dL — ABNORMAL HIGH (ref 70–99)
Potassium: 3.8 mEq/L (ref 3.5–5.1)
Sodium: 136 mEq/L (ref 135–145)

## 2012-05-03 MED ORDER — ESCITALOPRAM OXALATE 10 MG PO TABS: 10.0000 mg | ORAL_TABLET | Freq: Every day | ORAL | Status: DC

## 2012-05-03 NOTE — Assessment & Plan Note (Signed)
Unchanged.  Pt stopped SSRI due to side effects.  Previously tolerated Lexapro w/out difficulty.  Will restart.  Pt now open to idea of counseling.  List of providers given.  Will follow.

## 2012-05-03 NOTE — Assessment & Plan Note (Signed)
Pt's PE WNL w/ exception of morbid obesity.  Check labs.  UTD on GYN.  Anticipatory guidance provided.

## 2012-05-03 NOTE — Assessment & Plan Note (Signed)
Chronic problem.  Due for labs.  Adjust meds prn. 

## 2012-05-03 NOTE — Patient Instructions (Addendum)
Follow up in 6-8 weeks to discuss mood and weight loss progress Start the Lexapro once daily We'll notify you of your lab results Try and get regular exercise and make healthy food choices Call with any questions or concerns Have a great summer!!

## 2012-05-03 NOTE — Progress Notes (Signed)
  Subjective:    Patient ID: Joanne Harris, female    DOB: 19-May-1981, 31 y.o.   MRN: [id number]  HPI CPE- UTD on GYN.  Obesity- continues to battle, now on new program- 1200 calories daily w/ 30 minutes of exercise.  Is not a supervised program- did this previously w/ doctor supervision on phentermine.  Went to lap band seminar, 'i don't think it's for me'.  Depression/anxiety- previously was resistant to counseling but is now ready to try.  Stopped celexa due to side effects- muscle fatigue/weakness, insomnia.  Previously on Lexapro w/ good results.   Review of Systems Patient reports no vision/ hearing changes, adenopathy,fever, weight change,  persistant/recurrent hoarseness , swallowing issues, chest pain, palpitations, edema, persistant/recurrent cough, hemoptysis, dyspnea (rest/exertional/paroxysmal nocturnal), gastrointestinal bleeding (melena, rectal bleeding), abdominal pain, significant heartburn, bowel changes, GU symptoms (dysuria, hematuria, incontinence), Gyn symptoms (abnormal  bleeding, pain),  syncope, focal weakness, memory loss, numbness & tingling, skin/hair/nail changes, abnormal bruising or bleeding, anxiety, or depression.     Objective:   Physical Exam General Appearance:    Alert, cooperative, no distress, appears stated age, obese  Head:    Normocephalic, without obvious abnormality, atraumatic  Eyes:    PERRL, conjunctiva/corneas clear, EOM's intact, fundi    benign, both eyes  Ears:    Normal TM's and external ear canals, both ears  Nose:   Nares normal, septum midline, mucosa normal, no drainage    or sinus tenderness  Throat:   Lips, mucosa, and tongue normal; teeth and gums normal  Neck:   Supple, symmetrical, trachea midline, no adenopathy;    Thyroid: no enlargement/tenderness/nodules  Back:     Symmetric, no curvature, ROM normal, no CVA tenderness  Lungs:     Clear to auscultation bilaterally, respirations unlabored  Chest Wall:    No tenderness or  deformity   Heart:    Regular rate and rhythm, S1 and S2 normal, no murmur, rub   or gallop  Breast Exam:    Deferred to GYN  Abdomen:     Soft, non-tender, bowel sounds active all four quadrants,    no masses, no organomegaly  Genitalia:    Deferred to GYN  Rectal:    Extremities:   Extremities normal, atraumatic, no cyanosis or edema  Pulses:   2+ and symmetric all extremities  Skin:   Skin color, texture, turgor normal, no rashes or lesions  Lymph nodes:   Cervical, supraclavicular, and axillary nodes normal  Neurologic:   CNII-XII intact, normal strength, sensation and reflexes    throughout          Assessment & Plan:

## 2012-05-03 NOTE — Assessment & Plan Note (Signed)
Chronic problem.  Pt continues to battle this issue.  Has again started program for weight loss.  Has decided against lap band surgery at this time.  This is biggest risk to pt's health.  Will continue to follow closely.

## 2012-05-07 LAB — VITAMIN D 1,25 DIHYDROXY: Vitamin D3 1, 25 (OH)2: 43 pg/mL

## 2012-05-09 ENCOUNTER — Ambulatory Visit (INDEPENDENT_AMBULATORY_CARE_PROVIDER_SITE_OTHER): Payer: PRIVATE HEALTH INSURANCE | Admitting: Family Medicine

## 2012-05-09 ENCOUNTER — Encounter: Payer: Self-pay | Admitting: Family Medicine

## 2012-05-09 VITALS — BP 128/75 | HR 100 | Temp 98.4°F | Ht 64.0 in | Wt 342.0 lb

## 2012-05-09 DIAGNOSIS — E119 Type 2 diabetes mellitus without complications: Secondary | ICD-10-CM

## 2012-05-09 DIAGNOSIS — E039 Hypothyroidism, unspecified: Secondary | ICD-10-CM

## 2012-05-09 DIAGNOSIS — E1165 Type 2 diabetes mellitus with hyperglycemia: Secondary | ICD-10-CM | POA: Insufficient documentation

## 2012-05-09 MED ORDER — METFORMIN HCL 500 MG PO TABS: 500.0000 mg | ORAL_TABLET | Freq: Two times a day (BID) | ORAL | Status: DC

## 2012-05-09 NOTE — Patient Instructions (Addendum)
Follow up in 3 months to recheck sugars We'll call you with your nutrition and Endo appts Continue to take your Synthroid daily Keep up the good work on healthy diet and regular exercise Call with any questions or concerns You can totally do this!!!

## 2012-05-09 NOTE — Progress Notes (Signed)
  Subjective:    Patient ID: Joanne Harris, female    DOB: 1981/02/08, 31 y.o.   MRN: [id number]  HPI DM- new on recent labs.  Pt's A1C is currently 7.1. Has already started diet and exercise program.  Is upset w/ dx, 'i won't accept that this is me'.  Already getting yearly eye exams due to thyroid disorder.  Hypothyroid- TSH again near 26.  Pt reports taking of Synthroid daily.    Review of Systems For ROS see HPI     Objective:   Physical Exam  Vitals reviewed. Constitutional: She appears well-developed and well-nourished. No distress.  HENT:  Head: Normocephalic and atraumatic.  Eyes: Conjunctivae and EOM are normal. Pupils are equal, round, and reactive to light.       Bilateral proptosis  Psychiatric: She has a normal mood and affect. Her behavior is normal. Judgment and thought content normal.          Assessment & Plan:

## 2012-05-10 ENCOUNTER — Encounter: Payer: Self-pay | Admitting: Family Medicine

## 2012-05-10 ENCOUNTER — Telehealth: Payer: Self-pay | Admitting: Family Medicine

## 2012-05-10 NOTE — Assessment & Plan Note (Signed)
Deteriorated.  Pt's TSH again elevated despite taking meds daily.  Pt already over max recommended dose of synthroid.  Will refer to endo for ongoing management and additional evaluation.  Pt has not been compliant in past w/ daily meds which has led to variable TSH levels but pt states she has been taking meds regularly.  Weight loss will be easier when TSH within range and suspect sugars will also improve.

## 2012-05-10 NOTE — Assessment & Plan Note (Signed)
New dx.  Pt already has committed to low carb diet and exercise plan.  Open to idea of diabetes education and nutrition.  Will start Metformin 500mg  bid to not only control sugars but to assist w/ weight loss.  Will follow closely.

## 2012-05-10 NOTE — Telephone Encounter (Signed)
Please call pt and ask what she would like to do- continue seeing Ms Yetta Barre or get a new provider at that office (she would need to do that) or a new office entirely.

## 2012-05-10 NOTE — Telephone Encounter (Signed)
Dr. Beverely Low, in reference to the Endocrinology referral to [facility], you noted you preferred patient to see MD rather than PA.  I just called that office, and they state the patient has always seen March Rummage, their PA, and this is who her provider is.  In order for her to see MD, a change provider request would have to be submitted by the patient to their office manager.  They also made sure we know that this patient has multiple rescheduled appointments, and has been a noshow.  Patient currently already scheduled for 06/23/12, and she is aware because this is another appointment patient has rescheduled.  Please advise.

## 2012-05-10 NOTE — Telephone Encounter (Signed)
Thank you :)

## 2012-05-10 NOTE — Telephone Encounter (Signed)
Patient returned my call, she would like to be referred to a new office.  Per her request, I am referring her to Dr. Talmage Nap.

## 2012-05-10 NOTE — Telephone Encounter (Signed)
I have left voicemail for patient to call me.

## 2012-05-24 ENCOUNTER — Ambulatory Visit: Payer: PRIVATE HEALTH INSURANCE

## 2012-06-09 ENCOUNTER — Ambulatory Visit: Payer: PRIVATE HEALTH INSURANCE

## 2012-06-16 ENCOUNTER — Ambulatory Visit (INDEPENDENT_AMBULATORY_CARE_PROVIDER_SITE_OTHER): Payer: PRIVATE HEALTH INSURANCE | Admitting: Family Medicine

## 2012-06-16 ENCOUNTER — Other Ambulatory Visit (HOSPITAL_COMMUNITY)
Admission: RE | Admit: 2012-06-16 | Discharge: 2012-06-16 | Disposition: A | Discharge status: Home / Self Care | Payer: PRIVATE HEALTH INSURANCE | Source: Ambulatory Visit | Attending: Family Medicine | Admitting: Family Medicine

## 2012-06-16 ENCOUNTER — Encounter: Payer: Self-pay | Admitting: Family Medicine

## 2012-06-16 VITALS — BP 129/79 | HR 103 | Temp 98.7°F | Ht 64.75 in | Wt 323.6 lb

## 2012-06-16 DIAGNOSIS — Z309 Encounter for contraceptive management, unspecified: Secondary | ICD-10-CM

## 2012-06-16 DIAGNOSIS — IMO0001 Reserved for inherently not codable concepts without codable children: Secondary | ICD-10-CM | POA: Insufficient documentation

## 2012-06-16 DIAGNOSIS — E669 Obesity, unspecified: Secondary | ICD-10-CM

## 2012-06-16 DIAGNOSIS — Z124 Encounter for screening for malignant neoplasm of cervix: Secondary | ICD-10-CM | POA: Insufficient documentation

## 2012-06-16 DIAGNOSIS — Z01419 Encounter for gynecological examination (general) (routine) without abnormal findings: Secondary | ICD-10-CM | POA: Insufficient documentation

## 2012-06-16 NOTE — Patient Instructions (Addendum)
We'll call you with your GYN appt Keep up the good work on diet and exercise- it's working!!! Call with any questions or concerns Happy Labor Day!

## 2012-06-16 NOTE — Progress Notes (Signed)
  Subjective:    Patient ID: Joanne Harris, female    DOB: 1981/02/24, 31 y.o.   MRN: [id number]  HPI Pap- pt due for pap, would like to restart birth control.  Considering IUD.  Would like GYN referral  Obesity- chronic problem.  Has lost 20 lbs in 1 month.   Review of Systems For ROS see HPI     Objective:   Physical Exam  Vitals reviewed. Constitutional: She appears well-developed and well-nourished. No distress.  Pulmonary/Chest: Right breast exhibits no inverted nipple, no mass, no nipple discharge, no skin change and no tenderness. Left breast exhibits no inverted nipple, no mass, no nipple discharge, no skin change and no tenderness.  Genitourinary: There is no rash, tenderness, lesion or injury on the right labia. There is no rash, tenderness, lesion or injury on the left labia. Uterus is not enlarged and not tender. Cervix exhibits no motion tenderness, no discharge and no friability. Right adnexum displays no mass, no tenderness and no fullness. Left adnexum displays no mass, no tenderness and no fullness. No erythema or bleeding around the vagina. No signs of injury around the vagina. No vaginal discharge found.  Musculoskeletal: She exhibits no edema.  Psychiatric: She has a normal mood and affect. Her behavior is normal. Thought content normal.          Assessment & Plan:

## 2012-06-20 NOTE — Assessment & Plan Note (Signed)
Pap collected.  GYN exam WNL

## 2012-06-20 NOTE — Assessment & Plan Note (Signed)
Pt has lost 20 lbs since learning of her dx of diabetes.  Applauded her efforts.  Will continue to follow.

## 2012-06-20 NOTE — Assessment & Plan Note (Signed)
New.  Pt interested in IUD placement.  Will refer to GYN for appt.

## 2012-06-22 ENCOUNTER — Ambulatory Visit: Payer: PRIVATE HEALTH INSURANCE | Admitting: Family Medicine

## 2012-06-22 ENCOUNTER — Encounter: Payer: Self-pay | Admitting: *Deleted

## 2012-08-18 ENCOUNTER — Ambulatory Visit: Payer: PRIVATE HEALTH INSURANCE | Admitting: Family Medicine

## 2012-08-19 ENCOUNTER — Telehealth: Payer: Self-pay | Admitting: *Deleted

## 2012-08-19 MED ORDER — ONDANSETRON 8 MG PO TBDP: 8.0000 mg | ORAL_TABLET | Freq: Three times a day (TID) | ORAL | Status: DC | PRN

## 2012-08-19 NOTE — Telephone Encounter (Signed)
Left message to advise that MD Tabori wanted her to have some anti-nausea medication per her cancellation yesterday due to nausea and a RX was sent to [facility], we will see her at her upcoming OV and if she needs any further assistance call our office

## 2012-08-25 ENCOUNTER — Ambulatory Visit: Payer: PRIVATE HEALTH INSURANCE | Admitting: Family Medicine

## 2012-08-30 ENCOUNTER — Encounter: Payer: Self-pay | Admitting: Family Medicine

## 2012-08-30 ENCOUNTER — Ambulatory Visit (INDEPENDENT_AMBULATORY_CARE_PROVIDER_SITE_OTHER): Payer: PRIVATE HEALTH INSURANCE | Admitting: Family Medicine

## 2012-08-30 VITALS — BP 130/78 | HR 109 | Temp 98.8°F | Resp 16 | Wt 315.1 lb

## 2012-08-30 DIAGNOSIS — E039 Hypothyroidism, unspecified: Secondary | ICD-10-CM

## 2012-08-30 DIAGNOSIS — E119 Type 2 diabetes mellitus without complications: Secondary | ICD-10-CM

## 2012-08-30 LAB — BASIC METABOLIC PANEL
Calcium: 8.5 mg/dL (ref 8.4–10.5)
GFR: 137.68 mL/min (ref >60.00)
Sodium: 138 mEq/L (ref 135–145)

## 2012-08-30 LAB — TSH: TSH: 0.74 u[IU]/mL (ref 0.35–5.50)

## 2012-08-30 LAB — HEMOGLOBIN A1C: Hgb A1c MFr Bld: 6.1 % (ref 4.6–6.5)

## 2012-08-30 NOTE — Progress Notes (Signed)
  Subjective:    Patient ID: Joanne Harris, female    DOB: 11/01/1980, 31 y.o.   MRN: [id number]  HPI DM- has lost 8 lbs since last visit.  Exercising sporadically- ~1x/week.  Is now following low carb diet.  Denies feeling shaky or dizzy.  No visual changes.  No CP, SOB, HAs, edema.  Hypothyroid- TSH at last visit was 26.  On Synthroid daily.  Pt doesn't note any change in energy level.  Pt reports typically being 'hot all the time'.     Review of Systems For ROS see HPI     Objective:   Physical Exam  Vitals reviewed. Constitutional: She is oriented to person, place, and time. She appears well-developed and well-nourished. No distress.       obese  HENT:  Head: Normocephalic and atraumatic.  Eyes: Conjunctivae normal and EOM are normal. Pupils are equal, round, and reactive to light.       proptosis  Neck: Normal range of motion. Neck supple. No thyromegaly present.  Cardiovascular: Normal rate, regular rhythm, normal heart sounds and intact distal pulses.   No murmur heard. Pulmonary/Chest: Effort normal and breath sounds normal. No respiratory distress.  Abdominal: Soft. She exhibits no distension. There is no tenderness.  Musculoskeletal: She exhibits no edema.  Lymphadenopathy:    She has no cervical adenopathy.  Neurological: She is alert and oriented to person, place, and time.  Skin: Skin is warm and dry.  Psychiatric: She has a normal mood and affect. Her behavior is normal.          Assessment & Plan:

## 2012-08-30 NOTE — Patient Instructions (Addendum)
Follow up in 3 months to recheck sugar Try and get regular exercise Continue the healthy food choices- you're doing great! Call with any questions or concerns Happy Early Birthday!!

## 2012-08-31 NOTE — Assessment & Plan Note (Signed)
Recently dx'd, started metformin at last visit.  Has limited carbs and is working on regular exercise.  Check labs.  Adjust meds prn.

## 2012-08-31 NOTE — Assessment & Plan Note (Signed)
Chronic problem, TSH at last visit was very high.  Taking meds daily.  Check labs.  Adjust meds prn

## 2012-10-27 ENCOUNTER — Ambulatory Visit: Payer: PRIVATE HEALTH INSURANCE | Admitting: Family Medicine

## 2012-11-15 ENCOUNTER — Other Ambulatory Visit: Payer: Self-pay | Admitting: Family Medicine

## 2012-11-15 MED ORDER — SYNTHROID 300 MCG PO TABS: 300.0000 ug | ORAL_TABLET | Freq: Every day | ORAL | Status: DC

## 2012-11-15 NOTE — Telephone Encounter (Signed)
refill SYNTHROID 300 MCG Take 1 tablet (300 mcg total) by mouth daily. #30 last fill 02.28.2013

## 2012-11-15 NOTE — Telephone Encounter (Signed)
Rx sent 

## 2012-11-30 ENCOUNTER — Ambulatory Visit: Payer: PRIVATE HEALTH INSURANCE | Admitting: Family Medicine

## 2012-11-30 DIAGNOSIS — Z0289 Encounter for other administrative examinations: Secondary | ICD-10-CM

## 2012-12-10 ENCOUNTER — Other Ambulatory Visit: Payer: Self-pay

## 2013-03-01 ENCOUNTER — Other Ambulatory Visit: Payer: Self-pay | Admitting: Family Medicine

## 2013-03-01 DIAGNOSIS — E039 Hypothyroidism, unspecified: Secondary | ICD-10-CM

## 2013-03-01 NOTE — Telephone Encounter (Signed)
[facility] [address]

## 2013-03-10 ENCOUNTER — Ambulatory Visit: Payer: PRIVATE HEALTH INSURANCE | Admitting: Family Medicine

## 2013-03-17 ENCOUNTER — Ambulatory Visit: Payer: PRIVATE HEALTH INSURANCE | Admitting: Family Medicine

## 2013-05-05 ENCOUNTER — Encounter: Payer: PRIVATE HEALTH INSURANCE | Admitting: Family Medicine

## 2013-06-16 ENCOUNTER — Encounter: Payer: PRIVATE HEALTH INSURANCE | Admitting: Family Medicine

## 2013-08-31 ENCOUNTER — Other Ambulatory Visit: Payer: Self-pay

## 2014-03-15 ENCOUNTER — Other Ambulatory Visit: Payer: Self-pay | Admitting: Family Medicine

## 2014-03-15 NOTE — Telephone Encounter (Signed)
Med filled.  

## 2014-04-10 ENCOUNTER — Encounter: Payer: Self-pay | Admitting: Family Medicine

## 2014-04-10 ENCOUNTER — Ambulatory Visit (INDEPENDENT_AMBULATORY_CARE_PROVIDER_SITE_OTHER): Payer: PRIVATE HEALTH INSURANCE | Admitting: Family Medicine

## 2014-04-10 VITALS — BP 140/90 | HR 107 | Temp 98.0°F | Resp 16 | Wt 369.1 lb

## 2014-04-10 DIAGNOSIS — E669 Obesity, unspecified: Secondary | ICD-10-CM

## 2014-04-10 DIAGNOSIS — F341 Dysthymic disorder: Secondary | ICD-10-CM

## 2014-04-10 DIAGNOSIS — E119 Type 2 diabetes mellitus without complications: Secondary | ICD-10-CM

## 2014-04-10 DIAGNOSIS — E039 Hypothyroidism, unspecified: Secondary | ICD-10-CM

## 2014-04-10 LAB — HEPATIC FUNCTION PANEL
ALT: 25 U/L (ref 0–35)
AST: 29 U/L (ref 0–37)
Albumin: 4.5 g/dL (ref 3.5–5.2)
Alkaline Phosphatase: 54 U/L (ref 39–117)
BILIRUBIN TOTAL: 0.3 mg/dL (ref 0.2–1.2)
Bilirubin, Direct: 0 mg/dL (ref 0.0–0.3)
Total Protein: 7.8 g/dL (ref 6.0–8.3)

## 2014-04-10 LAB — BASIC METABOLIC PANEL
BUN: 10 mg/dL (ref 6–23)
CALCIUM: 9 mg/dL (ref 8.4–10.5)
CO2: 24 meq/L (ref 19–32)
CREATININE: 0.7 mg/dL (ref 0.4–1.2)
Chloride: 102 mEq/L (ref 96–112)
GFR: 123.03 mL/min (ref >60.00)
Glucose, Bld: 216 mg/dL — ABNORMAL HIGH (ref 70–99)
Potassium: 3.9 mEq/L (ref 3.5–5.1)
SODIUM: 135 meq/L (ref 135–145)

## 2014-04-10 LAB — LIPID PANEL
CHOL/HDL RATIO: 4
Cholesterol: 131 mg/dL (ref 0–200)
HDL: 33.8 mg/dL — ABNORMAL LOW (ref >39.00)
LDL CALC: 76 mg/dL (ref 0–99)
NonHDL: 97.2
Triglycerides: 108 mg/dL (ref 0.0–149.0)
VLDL: 21.6 mg/dL (ref 0.0–40.0)

## 2014-04-10 LAB — TSH: TSH: 6.65 u[IU]/mL — ABNORMAL HIGH (ref 0.35–4.50)

## 2014-04-10 LAB — HEMOGLOBIN A1C: HEMOGLOBIN A1C: 8.6 % — AB (ref 4.6–6.5)

## 2014-04-10 MED ORDER — FLUOXETINE HCL 20 MG PO TABS: 20.0000 mg | ORAL_TABLET | Freq: Every day | ORAL | Status: DC

## 2014-04-10 NOTE — Assessment & Plan Note (Signed)
Deteriorated.  Pt again in counseling.  Will start daily SSRI for better control of sxs.  Reviewed possible side effects.  Will follow closely.

## 2014-04-10 NOTE — Assessment & Plan Note (Signed)
Deteriorated.  Pt has gained 55 lbs since 11/13!  Stressed the need for healthy food choices and regular exercise to prevent serious medical complications from her weight.  Will check labs to risk stratify and follow closely.

## 2014-04-10 NOTE — Assessment & Plan Note (Signed)
Pt stopped Metformin when A1C fell under 6.5 but since then, she has gained 55 lbs.  Due for labs.  Adjust tx plan prn.  Pt expressed understanding and is in agreement w/ plan.

## 2014-04-10 NOTE — Patient Instructions (Signed)
Follow up as scheduled Start the Prozac daily for your anxiety We'll notify you of your lab results and make any changes if needed Try and make healthy food choices and get regular exercise- you can do this! Call with any questions or concerns Hang in there!!!

## 2014-04-10 NOTE — Progress Notes (Signed)
   Subjective:    Patient ID: Joanne Harris, female    DOB: Jul 16, 1981, 33 y.o.   MRN: [id number]  Anxiety     Anxiety/Depression- chronic problem, was previously on Lexapro and Celexa.  Mom had double mastectomy last year due to breast cancer.  Pt reports depression is 'getting worse'.  Seeing a counselor who recommended she get back on medication.  Is having people aversion- 'i feel like i have to move to get out of their way'.  Hypothyroid- chronic problem, currently on Synthroid 364mcg daily. + fatigue, weight gain.  No constipation, no dry skin/hair/nails   DM- A1C was 7.1 in 05/03/12, decreased to 6.1 on 08/30/12 so she stopped Metformin.  Has not had A1C since and has gained 55 lbs.  No CP, SOB, HAs, visual changes.  Obesity- pt has increased from 315 --> 369.     Review of Systems For ROS see HPI     Objective:   Physical Exam  Vitals reviewed. Constitutional: She is oriented to person, place, and time. She appears well-developed and well-nourished. No distress.  Morbidly obese  HENT:  Head: Normocephalic and atraumatic.  Eyes: Conjunctivae and EOM are normal. Pupils are equal, round, and reactive to light.  Exophthalmos  Neck: Normal range of motion. Neck supple. No thyromegaly present.  Cardiovascular: Normal rate, regular rhythm, normal heart sounds and intact distal pulses.   No murmur heard. Pulmonary/Chest: Effort normal and breath sounds normal. No respiratory distress.  Abdominal: Soft. She exhibits no distension. There is no tenderness.  Musculoskeletal: She exhibits no edema.  Lymphadenopathy:    She has no cervical adenopathy.  Neurological: She is alert and oriented to person, place, and time.  Skin: Skin is warm and dry.  Psychiatric: She has a normal mood and affect. Her behavior is normal.          Assessment & Plan:

## 2014-04-10 NOTE — Assessment & Plan Note (Signed)
Chronic problem.  Pt on 354mcg daily.  Has had issues w/ compliance in the past.  Check labs today and adjust dose prn.  Pt expressed understanding and is in agreement w/ plan.

## 2014-04-10 NOTE — Progress Notes (Signed)
Pre visit review using our clinic review tool, if applicable. No additional management support is needed unless otherwise documented below in the visit note. 

## 2014-04-11 ENCOUNTER — Other Ambulatory Visit: Payer: Self-pay | Admitting: General Practice

## 2014-04-11 MED ORDER — METFORMIN HCL ER (MOD) 500 MG PO TB24: ORAL_TABLET | ORAL | Status: DC

## 2014-06-20 ENCOUNTER — Encounter: Payer: PRIVATE HEALTH INSURANCE | Admitting: Family Medicine

## 2014-07-23 ENCOUNTER — Other Ambulatory Visit: Payer: Self-pay | Admitting: General Practice

## 2014-07-23 MED ORDER — SYNTHROID 300 MCG PO TABS: ORAL_TABLET | ORAL | Status: DC

## 2014-09-17 ENCOUNTER — Telehealth: Payer: Self-pay | Admitting: Family Medicine

## 2014-09-17 MED ORDER — FLUCONAZOLE 150 MG PO TABS: 150.0000 mg | ORAL_TABLET | Freq: Once | ORAL | Status: DC

## 2014-09-17 NOTE — Telephone Encounter (Signed)
Ok for Diflucan 150mg  x1 dose.  If no improvement in vaginal symptoms, pt will need OV

## 2014-09-17 NOTE — Telephone Encounter (Signed)
Med filled.  

## 2014-09-17 NOTE — Telephone Encounter (Signed)
Caller name: Joanne Harris, Joanne Harris Relation to pt: self  Call back number: [phone] [phone]  Reason for call:   Pt requesting a refill fluconazole (DIFLUCAN) 150 MG tablet. Advised pt she needs an appointment pt insisted to inform PCP first to see if rx can be sent over.

## 2014-09-17 NOTE — Telephone Encounter (Signed)
Pt has not been seen since 04-10-14. Please advise.

## 2014-09-25 LAB — HM DIABETES EYE EXAM

## 2014-10-01 ENCOUNTER — Ambulatory Visit (INDEPENDENT_AMBULATORY_CARE_PROVIDER_SITE_OTHER): Payer: PRIVATE HEALTH INSURANCE | Admitting: Family Medicine

## 2014-10-01 ENCOUNTER — Encounter: Payer: Self-pay | Admitting: Family Medicine

## 2014-10-01 VITALS — BP 128/82 | HR 86 | Temp 98.6°F | Resp 16 | Ht 63.5 in | Wt 329.4 lb

## 2014-10-01 DIAGNOSIS — E119 Type 2 diabetes mellitus without complications: Secondary | ICD-10-CM

## 2014-10-01 DIAGNOSIS — E1139 Type 2 diabetes mellitus with other diabetic ophthalmic complication: Secondary | ICD-10-CM

## 2014-10-01 DIAGNOSIS — Z0000 Encounter for general adult medical examination without abnormal findings: Secondary | ICD-10-CM

## 2014-10-01 NOTE — Progress Notes (Signed)
Pre visit review using our clinic review tool, if applicable. No additional management support is needed unless otherwise documented below in the visit note. 

## 2014-10-01 NOTE — Assessment & Plan Note (Signed)
Pt's PE WNL w/ exception of obesity and known exophthalmos.  UTD on pap.  Check labs.  Anticipatory guidance provided.

## 2014-10-01 NOTE — Progress Notes (Signed)
   Subjective:    Patient ID: Joanne Harris, female    DOB: Jan 20, 1981, 33 y.o.   MRN: [id number]  HPI CPE- GYN Julien Girt), pt has lost 40 lbs in 6 months w/o 'drastic' attempts.  Not exercising.  Pt is attempting to drink a lot of water- '1/2 my weight'- and now having urinary frequency, increased thirst.  + nausea.   Review of Systems Patient reports no hearing changes, adenopathy, fever,  persistant/recurrent hoarseness , swallowing issues, chest pain, palpitations, edema, persistant/recurrent cough, hemoptysis, dyspnea (rest/exertional/paroxysmal nocturnal), gastrointestinal bleeding (melena, rectal bleeding), abdominal pain, significant heartburn, bowel changes, Gyn symptoms (abnormal  bleeding, pain), syncope, focal weakness, memory loss, numbness & tingling, skin/hair/nail changes, abnormal bruising or bleeding, anxiety, or depression.   + fatigue + blurry vision- went to eye doctor and had considerable jump in prescription    Objective:   Physical Exam General Appearance:    Alert, cooperative, no distress, appears stated age, obese  Head:    Normocephalic, without obvious abnormality, atraumatic  Eyes:    PERRL, conjunctiva/corneas clear, EOM's intact, fundi    benign, both eyes, exophthalmos  Ears:    Normal TM's and external ear canals, both ears  Nose:   Nares normal, septum midline, mucosa normal, no drainage    or sinus tenderness  Throat:   Lips, mucosa, and tongue normal; teeth and gums normal  Neck:   Supple, symmetrical, trachea midline, no adenopathy;    Thyroid: no enlargement/tenderness/nodules  Back:     Symmetric, no curvature, ROM normal, no CVA tenderness  Lungs:     Clear to auscultation bilaterally, respirations unlabored  Chest Wall:    No tenderness or deformity   Heart:    Regular rate and rhythm, S1 and S2 normal, no murmur, rub   or gallop  Breast Exam:    Deferred to GYN  Abdomen:     Soft, non-tender, bowel sounds active all four quadrants,    no  masses, no organomegaly  Genitalia:    Deferred to GYN  Rectal:    Extremities:   Extremities normal, atraumatic, no cyanosis or edema  Pulses:   2+ and symmetric all extremities  Skin:   Skin color, texture, turgor normal, no rashes or lesions  Lymph nodes:   Cervical, supraclavicular, and axillary nodes normal  Neurologic:   CNII-XII intact, normal strength, sensation and reflexes    throughout          Assessment & Plan:

## 2014-10-01 NOTE — Patient Instructions (Signed)
Follow up in 3 months to recheck diabetes We'll notify you of your lab results and make any changes if needed Continue to make healthy food choices and get regular exercise Call with any questions or concerns Happy Holidays!!!

## 2014-10-01 NOTE — Assessment & Plan Note (Signed)
Chronic problem.  Based on pt's sxs- unexplained weight loss, blurry vision, increased thirst, polyuria- I suspect her diabetes is out of control.  UTD on eye exam.  Check labs.  Adjust meds prn.  Will follow closely.

## 2014-10-02 ENCOUNTER — Telehealth: Payer: Self-pay | Admitting: General Practice

## 2014-10-02 ENCOUNTER — Telehealth: Payer: Self-pay | Admitting: *Deleted

## 2014-10-02 DIAGNOSIS — E119 Type 2 diabetes mellitus without complications: Secondary | ICD-10-CM

## 2014-10-02 LAB — BASIC METABOLIC PANEL
BUN: 14 mg/dL (ref 6–23)
CALCIUM: 10.8 mg/dL — AB (ref 8.4–10.5)
CO2: 23 mEq/L (ref 19–32)
CREATININE: 1.2 mg/dL (ref 0.4–1.2)
Chloride: 92 mEq/L — ABNORMAL LOW (ref 96–112)
GFR: 69.6 mL/min (ref >60.00)
Glucose, Bld: 544 mg/dL (ref 70–99)
Potassium: 4.1 mEq/L (ref 3.5–5.1)
Sodium: 130 mEq/L — ABNORMAL LOW (ref 135–145)

## 2014-10-02 LAB — CBC WITH DIFFERENTIAL/PLATELET
BASOS PCT: 0.2 % (ref 0.0–3.0)
Basophils Absolute: 0 10*3/uL (ref 0.0–0.1)
EOS PCT: 0.6 % (ref 0.0–5.0)
Eosinophils Absolute: 0.1 10*3/uL (ref 0.0–0.7)
HEMATOCRIT: 44.1 % (ref 36.0–46.0)
HEMOGLOBIN: 14.4 g/dL (ref 12.0–15.0)
Lymphocytes Relative: 23.9 % (ref 12.0–46.0)
Lymphs Abs: 3.5 10*3/uL (ref 0.7–4.0)
MCHC: 32.7 g/dL (ref 30.0–36.0)
MCV: 85.4 fl (ref 78.0–100.0)
MONO ABS: 0.4 10*3/uL (ref 0.1–1.0)
MONOS PCT: 2.9 % — AB (ref 3.0–12.0)
NEUTROS ABS: 10.7 10*3/uL — AB (ref 1.4–7.7)
Neutrophils Relative %: 72.4 % (ref 43.0–77.0)
Platelets: 420 10*3/uL — ABNORMAL HIGH (ref 150.0–400.0)
RBC: 5.16 Mil/uL — ABNORMAL HIGH (ref 3.87–5.11)
RDW: 16.5 % — ABNORMAL HIGH (ref 11.5–15.5)
WBC: 14.8 10*3/uL — ABNORMAL HIGH (ref 4.0–10.5)

## 2014-10-02 LAB — HEPATIC FUNCTION PANEL
ALT: 24 U/L (ref 0–35)
AST: 25 U/L (ref 0–37)
Albumin: 5.1 g/dL (ref 3.5–5.2)
Alkaline Phosphatase: 83 U/L (ref 39–117)
BILIRUBIN TOTAL: 1 mg/dL (ref 0.2–1.2)
Bilirubin, Direct: 0.1 mg/dL (ref 0.0–0.3)
Total Protein: 9.3 g/dL — ABNORMAL HIGH (ref 6.0–8.3)

## 2014-10-02 LAB — VITAMIN D 25 HYDROXY (VIT D DEFICIENCY, FRACTURES): VITD: 16.5 ng/mL — ABNORMAL LOW (ref 30.00–100.00)

## 2014-10-02 LAB — HEMOGLOBIN A1C: Hgb A1c MFr Bld: 12.6 % — ABNORMAL HIGH (ref 4.6–6.5)

## 2014-10-02 LAB — LIPID PANEL
CHOL/HDL RATIO: 6
Cholesterol: 263 mg/dL — ABNORMAL HIGH (ref 0–200)
HDL: 45.9 mg/dL (ref >39.00)
NONHDL: 217.1
VLDL: 103.6 mg/dL — AB (ref 0.0–40.0)

## 2014-10-02 LAB — LDL CHOLESTEROL, DIRECT: Direct LDL: 130 mg/dL

## 2014-10-02 LAB — TSH: TSH: 192 u[IU]/mL — AB (ref 0.35–4.50)

## 2014-10-02 MED ORDER — METFORMIN HCL 1000 MG PO TABS: 1000.0000 mg | ORAL_TABLET | Freq: Two times a day (BID) | ORAL | Status: DC

## 2014-10-02 NOTE — Telephone Encounter (Signed)
Received call from [facility] with a critical glucose result of :544, and Hemoglobin A1c:12.6.  Critical results given verbally to [facility] and she reported results to Dr. Cherre Blanc

## 2014-10-02 NOTE — Telephone Encounter (Signed)
Received a Critical result in regards to pt. Glucose level is 544 and A1c is 12.6. Spoke with provider and was advised to have pt start metformin 1000mg  BID and refer to endocrinology. Pt agreed to the above treatment. [facility] to [facility] was placed.

## 2014-10-03 ENCOUNTER — Other Ambulatory Visit: Payer: Self-pay | Admitting: General Practice

## 2014-10-03 MED ORDER — VITAMIN D (ERGOCALCIFEROL) 1.25 MG (50000 UNIT) PO CAPS: 50000.0000 [IU] | ORAL_CAPSULE | ORAL | Status: DC

## 2014-11-05 ENCOUNTER — Other Ambulatory Visit: Payer: PRIVATE HEALTH INSURANCE | Admitting: *Deleted

## 2014-11-05 ENCOUNTER — Other Ambulatory Visit: Payer: Self-pay | Admitting: *Deleted

## 2014-11-05 ENCOUNTER — Ambulatory Visit (INDEPENDENT_AMBULATORY_CARE_PROVIDER_SITE_OTHER): Payer: PRIVATE HEALTH INSURANCE | Admitting: Internal Medicine

## 2014-11-05 ENCOUNTER — Encounter: Payer: Self-pay | Admitting: Internal Medicine

## 2014-11-05 VITALS — BP 118/62 | HR 108 | Temp 98.5°F | Resp 12 | Ht 63.5 in | Wt 336.6 lb

## 2014-11-05 DIAGNOSIS — E119 Type 2 diabetes mellitus without complications: Secondary | ICD-10-CM

## 2014-11-05 DIAGNOSIS — E890 Postprocedural hypothyroidism: Secondary | ICD-10-CM

## 2014-11-05 LAB — TSH: TSH: 3.24 u[IU]/mL (ref 0.35–4.50)

## 2014-11-05 LAB — GLUCOSE, POCT (MANUAL RESULT ENTRY): POC GLUCOSE: 175 mg/dL — AB (ref 70–99)

## 2014-11-05 LAB — T4, FREE: FREE T4: 1.18 ng/dL (ref 0.60–1.60)

## 2014-11-05 MED ORDER — ONETOUCH DELICA LANCETS FINE MISC: Status: DC

## 2014-11-05 MED ORDER — GLUCOSE BLOOD VI STRP: ORAL_STRIP | Status: DC

## 2014-11-05 MED ORDER — SITAGLIPTIN PHOSPHATE 100 MG PO TABS: 100.0000 mg | ORAL_TABLET | Freq: Every day | ORAL | Status: DC

## 2014-11-05 NOTE — Patient Instructions (Addendum)
Pease continue Metformin 1000 mg 2x a day. Add Januvia 100 mg in am.  Please stop at the lab.  Please return in 1 month with your sugar log.   Please let me know if the sugars are consistently <80 or >200.  PATIENT INSTRUCTIONS FOR TYPE 2 DIABETES:  **Please join MyChart!** - see attached instructions about how to join if you have not done so already.  DIET AND EXERCISE Diet and exercise is an important part of diabetic treatment.  We recommended aerobic exercise in the form of brisk walking (working between 40-60% of maximal aerobic capacity, similar to brisk walking) for 150 minutes per week (such as 30 minutes five days per week) along with 3 times per week performing 'resistance' training (using various gauge rubber tubes with handles) 5-10 exercises involving the major muscle groups (upper body, lower body and core) performing 10-15 repetitions (or near fatigue) each exercise. Start at half the above goal but build slowly to reach the above goals. If limited by weight, joint pain, or disability, we recommend daily walking in a swimming pool with water up to waist to reduce pressure from joints while allow for adequate exercise.    BLOOD GLUCOSES Monitoring your blood glucoses is important for continued management of your diabetes. Please check your blood glucoses 2-4 times a day: fasting, before meals and at bedtime (you can rotate these measurements - e.g. one day check before the 3 meals, the next day check before 2 of the meals and before bedtime, etc.).   HYPOGLYCEMIA (low blood sugar) Hypoglycemia is usually a reaction to not eating, exercising, or taking too much insulin/ other diabetes drugs.  Symptoms include tremors, sweating, hunger, confusion, headache, etc. Treat IMMEDIATELY with 15 grams of Carbs: . 4 glucose tablets .  cup regular juice/soda . 2 tablespoons raisins . 4 teaspoons sugar . 1 tablespoon honey Recheck blood glucose in 15 mins and repeat above if still  symptomatic/blood glucose <100.  RECOMMENDATIONS TO REDUCE YOUR RISK OF DIABETIC COMPLICATIONS: * Take your prescribed MEDICATION(S) * Follow a DIABETIC diet: Complex carbs, fiber rich foods, (monounsaturated and polyunsaturated) fats * AVOID saturated/trans fats, high fat foods, >2,300 mg salt per day. * EXERCISE at least 5 times a week for 30 minutes or preferably daily.  * DO NOT SMOKE OR DRINK more than 1 drink a day. * Check your FEET every day. Do not wear tightfitting shoes. Contact us if you develop an ulcer * See your EYE doctor once a year or more if needed * Get a FLU shot once a year * Get a PNEUMONIA vaccine once before and once after age 67 years  GOALS:  * Your Hemoglobin A1c of <7%  * fasting sugars need to be <130 * after meals sugars need to be <180 (2h after you start eating) * Your Systolic BP should be 527 or lower  * Your Diastolic BP should be 80 or lower  * Your HDL (Good Cholesterol) should be 40 or higher  * Your LDL (Bad Cholesterol) should be 100 or lower. * Your Triglycerides should be 150 or lower  * Your Urine microalbumin (kidney function) should be <30 * Your Body Mass Index should be 25 or lower    Please consider the following ways to cut down carbs and fat and increase fiber and micronutrients in your diet: - substitute whole grain for white bread or pasta - substitute brown rice for white rice - substitute 90-calorie flat bread pieces for slices of bread  when possible - substitute sweet potatoes or yams for white potatoes - substitute humus for margarine - substitute tofu for cheese when possible - substitute almond or rice milk for regular milk (would not drink soy milk daily due to concern for soy estrogen influence on breast cancer risk) - substitute dark chocolate for other sweets when possible - substitute water - can add lemon or orange slices for taste - for diet sodas (artificial sweeteners will trick your body that you can eat sweets  without getting calories and will lead you to overeating and weight gain in the long run) - do not skip breakfast or other meals (this will slow down the metabolism and will result in more weight gain over time)  - can try smoothies made from fruit and almond/rice milk in am instead of regular breakfast - can also try old-fashioned (not instant) oatmeal made with almond/rice milk in am - order the dressing on the side when eating salad at a restaurant (pour less than half of the dressing on the salad) - eat as little meat as possible - can try juicing, but should not forget that juicing will get rid of the fiber, so would alternate with eating raw veg./fruits or drinking smoothies - use as little oil as possible, even when using olive oil - can dress a salad with a mix of balsamic vinegar and lemon juice, for e.g. - use agave nectar, stevia sugar, or regular sugar rather than artificial sweateners - steam or broil/roast veggies  - snack on veggies/fruit/nuts (unsalted, preferably) when possible, rather than processed foods - reduce or eliminate aspartame in diet (it is in diet sodas, chewing gum, etc) Read the labels!  Try to read Dr. Janene Harvey book: "Program for Reversing Diabetes" for other ideas for healthy eating.

## 2014-11-05 NOTE — Progress Notes (Signed)
Patient ID: Joanne Harris, female   DOB: January 20, 1981, 34 y.o.   MRN: [id number]  HPI: Joanne Harris is a 34 y.o.-year-old female, referred by her PCP, Dr. Birdie Riddle, for management of DM2, dx in 2013, non-insulin-dependent, uncontrolled, without complications.  Last hemoglobin A1c was: Lab Results  Component Value Date   HGBA1C 12.6* 10/01/2014   HGBA1C 8.6* 04/10/2014   HGBA1C 6.1 08/30/2012   Pt is on a regimen of: - Metformin 1000 mg po bid - started in 09/2014  Pt does not check  her sugars. ? lows. ? if she has hypoglycemia awareness. Highest sugar was 544.  Glucometer: none  Pt's meals are - cut carbs in the last month. Before this, skipped b'fast. - Breakfast: protein shake or greek yoghurt w/ fruit - Lunch: protein + veggie - Dinner: protein + veggie - Snacks: 2 a day: pork rinds, cheese sticks, nuts, peperonis Exercise: dance 2x a week  - no CKD, last BUN/creatinine:  Lab Results  Component Value Date   BUN 14 10/01/2014   CREATININE 1.2 10/01/2014   - last set of lipids: Lab Results  Component Value Date   CHOL 263* 10/01/2014   HDL 45.90 10/01/2014   LDLCALC 76 04/10/2014   LDLDIRECT 130.0 10/01/2014   TRIG * 10/01/2014    518.0 Triglyceride is over 400; calculations on Lipids are invalid.   CHOLHDL 6 10/01/2014   - last eye exam was in 10/31/2014. No DR.  - no numbness and tingling in her feet.  She c/o blurry vision, increased urination, nocturia >> improved.  Pt has FH of DM in [place], auncle.  Hypothyroidism:  Dx with Graves in highschool (2001) >> had RAI tx same year. Started LT4 afterwards.  Reviewed TFTs  - fluctuating in the past, with very high TSH at last check: Lab Results  Component Value Date   TSH 192.00* 10/01/2014   TSH 6.65* 04/10/2014   TSH 0.74 08/30/2012   TSH 26.08* 05/03/2012   TSH 43.69* 01/02/2011   TSH 0.82 09/02/2010   TSH 17.67* 04/24/2010   TSH 1.46 12/05/2009   TSH 6.65* 06/13/2009   TSH 6.820 04/26/2009   FREET4  1.1 11/14/2008   She is on LT4 300 mcg daily now, but not taking it for 2 mo before last set of tests.  She is taking it: - 7 am - fasting - w/ water or nothing - b'fast >1 h later - no Ca, iron, MVI, PPI  No FH of hypothyroidism or ThyCA, but has Graves ds. In [place].  ROS: Constitutional: no weight gain/loss (per our scale, she gained 7 lbs in last mo), + fatigue, no subjective hyperthermia/hypothermia, + poor sleep Eyes: no blurry vision, no xerophthalmia ENT: no sore throat, no nodules palpated in throat, no dysphagia/odynophagia, no hoarseness Cardiovascular: no CP/SOB/palpitations/leg swelling Respiratory: no cough/SOB Gastrointestinal: no N/V/+D/no C Musculoskeletal: no muscle/joint aches Skin: no rashes Neurological: no tremors/numbness/tingling/dizziness Psychiatric: no depression/+ anxiety  Past Medical History  Diagnosis Date  . Hypothyroid   . Obesity   . Depression   . Diabetes mellitus    Past Surgical History  Procedure Laterality Date  . Cholecystectomy     History   Social History  . Marital Status: Single    Spouse Name: N/A    Number of Children: 0   Occupational History  . Insurance agent   Social History Main Topics  . Smoking status: Never Smoker   . Smokeless tobacco: Not on file  . Alcohol Use: Yes  Comment: occ.  . Drug Use: No   Current Outpatient Prescriptions on File Prior to Visit  Medication Sig Dispense Refill  . FLUoxetine (PROZAC) 20 MG tablet Take 1 tablet (20 mg total) by mouth daily. 30 tablet 3  . metFORMIN (GLUCOPHAGE) 1000 MG tablet Take 1 tablet (1,000 mg total) by mouth 2 (two) times daily with a meal. 60 tablet 3  . SYNTHROID 300 MCG tablet TAKE 1 TABLET (300 MCG TOTAL) BY MOUTH DAILY. 30 tablet 3  . Vitamin D, Ergocalciferol, (DRISDOL) 50000 UNITS CAPS capsule Take 1 capsule (50,000 Units total) by mouth every 7 (seven) days. 12 capsule 0   No current facility-administered medications on file prior to visit.  IUD  placed 06/2012. No Known Allergies Family History  Problem Relation Age of Onset  . Hypertension Mother   . Hypertension      grandmother  . Diabetes      grandmother  . Stroke      grandfather  . Breast cancer      grandmother, late 9s  . Anemia Mother    PE: BP 118/62 mmHg  Pulse 108  Temp(Src) 98.5 F (36.9 C) (Oral)  Resp 12  Ht 5' 3.5" (1.613 m)  Wt 336 lb 9.6 oz (152.681 kg)  BMI 58.68 kg/m2  SpO2 98% Wt Readings from Last 3 Encounters:  11/05/14 336 lb 9.6 oz (152.681 kg)  10/01/14 329 lb 6 oz (149.404 kg)  04/10/14 369 lb 2 oz (167.434 kg)   Constitutional: obese, in NAD Eyes: PERRLA, EOMI, no exophthalmos ENT: moist mucous membranes, no thyromegaly, no cervical lymphadenopathy Cardiovascular: RRR, No MRG Respiratory: CTA B Gastrointestinal: abdomen soft, NT, ND, BS+ Musculoskeletal: no deformities, strength intact in all 4 Skin: moist, warm, no rashes Neurological: no tremor with outstretched hands, DTR normal in all 4  ASSESSMENT: 1. DM2, non-insulin-dependent, uncontrolled, without complications  2. Postablative hypothyroidism - uncontrolled - med noncompliance  PLAN:  1. Patient with 3 year h/o diabetes, recently uncontrolled, started on oral antidiabetic regimen. Unclear control now since pt not checking sugars. Sugar checked in the office: 175 (fasting) - We discussed about options for treatment, and I suggested to:  Patient Instructions  Pease continue Metformin 1000 mg 2x a day. Add Januvia 100 mg in am.  Please stop at the lab.  Please return in 1 month with your sugar log.   Please let me know if the sugars are consistently <80 or >200.  - Strongly advised her to start checking sugars at different times of the day - check 2 times a day, rotating checks - given sugar log and advised how to fill it and to bring it at next appt  - we gave her a OneTouch Verio meter and demonstrated use >> sent Rx for strips and lancets to pharmacy - given  foot care handout and explained the principles  - given instructions for hypoglycemia management "15-15 rule"  - advised for yearly eye exams - Return to clinic in 1 mo with sugar log   2. Postablative hypothyroidism - reinforced compliance with LT4 - discussed possible consequences of noncompliance with it - advised her to Take the thyroid hormone every day, with water, >30 minutes before breakfast, separated by >4 hours from acid reflux medications, calcium, iron, multivitamins. - check TFTs today - for now continue LT4 300 mcg daily.  Orders Only on 11/05/2014  Component Date Value Ref Range Status  . POC Glucose 11/05/2014 175* 70 - 99 mg/dl Final   cbg  in the office   Office Visit on 11/05/2014  Component Date Value Ref Range Status  . TSH 11/05/2014 3.24  0.35 - 4.50 uIU/mL Final  . Free T4 11/05/2014 1.18  0.60 - 1.60 ng/dL Final   TFTs normal, after she started to take the LT4 consistently. Continue the 300 mcg LT4 dose daily.

## 2014-12-06 ENCOUNTER — Ambulatory Visit (INDEPENDENT_AMBULATORY_CARE_PROVIDER_SITE_OTHER): Payer: PRIVATE HEALTH INSURANCE | Admitting: Internal Medicine

## 2014-12-06 ENCOUNTER — Encounter: Payer: Self-pay | Admitting: Internal Medicine

## 2014-12-06 VITALS — BP 112/64 | HR 113 | Temp 98.6°F | Resp 12 | Wt 332.0 lb

## 2014-12-06 DIAGNOSIS — E038 Other specified hypothyroidism: Secondary | ICD-10-CM

## 2014-12-06 DIAGNOSIS — E119 Type 2 diabetes mellitus without complications: Secondary | ICD-10-CM

## 2014-12-06 NOTE — Patient Instructions (Signed)
Patient Instructions  Ellis Parents continue Metformin 1000 mg 2x a day. Continue Januvia 100 mg in am.  Please return in 1-1.5 months with your sugar log.   Please let me know if the sugars are consistently <80 or >200.

## 2014-12-06 NOTE — Progress Notes (Signed)
Patient ID: Joanne Harris, female   DOB: Nov 04, 1980, 34 y.o.   MRN: [id number]  HPI: Joanne Harris is a 34 y.o.-year-old female, returning for f/u for DM2, dx in 2013, non-insulin-dependent, uncontrolled, without complications. Last visit 1 mo ago.  Last hemoglobin A1c was: Lab Results  Component Value Date   HGBA1C 12.6* 10/01/2014   HGBA1C 8.6* 04/10/2014   HGBA1C 6.1 08/30/2012   Pt is on a regimen of: - Metformin 1000 mg po bid - started in 09/2014 - Januvia 100 mg daily in am (added 10/2014)  Pt does check  her sugars 1-2x a day now: - am: 137-175 - 2h after b'fast: 140 - lunch: 111-136 - 2h after lunch: 98, 123-159, 170 - dinner: 119-125 - 2h  After dinner: 136, 158 - bedtime: 113, 132 No lows. Lowest 98.? if she has hypoglycemia awareness. Highest sugar was 544 >> 176  Glucometer: One Touch Verio  Pt's meals are: - Breakfast: protein shake or greek yoghurt w/ fruit - Lunch: protein + veggie - Dinner: protein + veggie - Snacks: 2 a day: pork rinds, cheese sticks, nuts, peperonis Exercise: dance 2x a week  - no CKD, last BUN/creatinine:  Lab Results  Component Value Date   BUN 14 10/01/2014   CREATININE 1.2 10/01/2014   - last set of lipids: Lab Results  Component Value Date   CHOL 263* 10/01/2014   HDL 45.90 10/01/2014   LDLCALC 76 04/10/2014   LDLDIRECT 130.0 10/01/2014   TRIG * 10/01/2014    518.0 Triglyceride is over 400; calculations on Lipids are invalid.   CHOLHDL 6 10/01/2014   - last eye exam was in 10/31/2014. No DR.  - no numbness and tingling in her feet.  She c/o blurry vision, increased urination, nocturia >> improved.  Hypothyroidism:  Reviewed hx:  Dx with Graves in highschool (2001) >> had RAI tx same year. Started LT4 afterwards.  She is on LT4 (Synthroid DAW)300 mcg daily now - has previous h/o noncompliance. TFTs normal, after she started to take the LT4 consistently.   She is taking it: - 7 am - fasting - w/ water or  nothing - b'fast >1 h later - no Ca, iron, MVI, PPI  Reviewed TFTs  - fluctuating in the past, with very high TSH at last check: Lab Results  Component Value Date   TSH 3.24 11/05/2014   TSH 192.00* 10/01/2014   TSH 6.65* 04/10/2014   TSH 0.74 08/30/2012   TSH 26.08* 05/03/2012   TSH 43.69* 01/02/2011   TSH 0.82 09/02/2010   TSH 17.67* 04/24/2010   TSH 1.46 12/05/2009   TSH 6.65* 06/13/2009   FREET4 1.18 11/05/2014   FREET4 1.1 11/14/2008   ROS: Constitutional: no weight gain/loss, no fatigue, no subjective hyperthermia/hypothermia, + poor sleep Eyes: no blurry vision, no xerophthalmia ENT: no sore throat, no nodules palpated in throat, no dysphagia/odynophagia, no hoarseness Cardiovascular: no CP/SOB/palpitations/leg swelling Respiratory: no cough/SOB Gastrointestinal: no N/V/+D/no C Musculoskeletal: no muscle/joint aches Skin: no rashes Neurological: no tremors/numbness/tingling/dizziness  I reviewed pt's medications, allergies, PMH, social hx, family hx, and changes were documented in the history of present illness. Otherwise, unchanged from my initial visit note:  Past Medical History  Diagnosis Date  . Hypothyroid   . Obesity   . Depression   . Diabetes mellitus    Past Surgical History  Procedure Laterality Date  . Cholecystectomy     History   Social History  . Marital Status: Single    Spouse Name: N/A  Number of Children: 0   Occupational History  . Insurance agent   Social History Main Topics  . Smoking status: Never Smoker   . Smokeless tobacco: Not on file  . Alcohol Use: Yes     Comment: occ.  . Drug Use: No   Current Outpatient Prescriptions on File Prior to Visit  Medication Sig Dispense Refill  . Ascorbic Acid (VITAMIN C) 500 MG CAPS Take 1 capsule by mouth daily.    Marland Kitchen b complex vitamins tablet Take 1 tablet by mouth daily.    Marland Kitchen FLUoxetine (PROZAC) 20 MG tablet Take 1 tablet (20 mg total) by mouth daily. 30 tablet 3  . glucose blood  (ONETOUCH VERIO) test strip Use to test blood sugar 2 times daily as instructed. 100 each 11  . metFORMIN (GLUCOPHAGE) 1000 MG tablet Take 1 tablet (1,000 mg total) by mouth 2 (two) times daily with a meal. 60 tablet 3  . ONETOUCH DELICA LANCETS FINE MISC Use to test blood sugar 2 times daily as instructed. 100 each 11  . sitaGLIPtin (JANUVIA) 100 MG tablet Take 1 tablet (100 mg total) by mouth daily. 30 tablet 2  . SYNTHROID 300 MCG tablet TAKE 1 TABLET (300 MCG TOTAL) BY MOUTH DAILY. 30 tablet 3  . Vitamin D, Ergocalciferol, (DRISDOL) 50000 UNITS CAPS capsule Take 1 capsule (50,000 Units total) by mouth every 7 (seven) days. 12 capsule 0   No current facility-administered medications on file prior to visit.  IUD placed 06/2012. No Known Allergies Family History  Problem Relation Age of Onset  . Hypertension Mother   . Hypertension      grandmother  . Diabetes      grandmother  . Stroke      grandfather  . Breast cancer      grandmother, late 43s  . Anemia Mother    PE: BP 112/64 mmHg  Pulse 113  Temp(Src) 98.6 F (37 C) (Oral)  Resp 12  Wt 332 lb (150.594 kg)  SpO2 97% Wt Readings from Last 3 Encounters:  12/06/14 332 lb (150.594 kg)  11/05/14 336 lb 9.6 oz (152.681 kg)  10/01/14 329 lb 6 oz (149.404 kg)   Constitutional: obese, in NAD Eyes: PERRLA, EOMI, no exophthalmos ENT: moist mucous membranes, no thyromegaly, no cervical lymphadenopathy Cardiovascular: RRR, No MRG Respiratory: CTA B Gastrointestinal: abdomen soft, NT, ND, BS+ Musculoskeletal: no deformities, strength intact in all 4 Skin: moist, warm, no rashes; + acanthosis nigricans on neck Neurological: no tremor with outstretched hands, DTR normal in all 4  ASSESSMENT: 1. DM2, non-insulin-dependent, uncontrolled, without complications  2. Postablative hypothyroidism - h/o med noncompliance  PLAN:  1. Patient with 3 year h/o diabetes, recently uncontrolled, started on oral antidiabetic regimen. She  brings a sugar log today >> sugars much improved. She has higher sugars in am (sometimes grapes or other snacks at night), but the ones throughout the day are at or very close to target! We will not change her regimen for now. - We discussed about options for treatment, and I suggested to:   Patient Instructions  Pease continue Metformin 1000 mg 2x a day. Continue Januvia 100 mg in am.  Please return in 1-1.5 months with your sugar log.   Please let me know if the sugars are consistently <80 or >200.  - continue checking sugars at different times of the day - check 2 times a day, rotating checks - she is UTD with yearly eye exams - Return to clinic in 1-1.5  mo with sugar log   2. Postablative hypothyroidism - reinforced compliance with LT4, but started to take it consistently after we discussed possible consequences of noncompliance with it. She is on LT4 300 mcg >> latest TFTs normal last month. - continue take the thyroid hormone every day, with water, >30 minutes before breakfast, separated by >4 hours from acid reflux medications, calcium, iron, multivitamins. - check TFTs at next visit - for now continue LT4 300 mcg daily.

## 2014-12-31 ENCOUNTER — Encounter: Payer: Self-pay | Admitting: Family Medicine

## 2014-12-31 ENCOUNTER — Ambulatory Visit (INDEPENDENT_AMBULATORY_CARE_PROVIDER_SITE_OTHER): Payer: PRIVATE HEALTH INSURANCE | Admitting: Family Medicine

## 2014-12-31 VITALS — BP 120/72 | HR 80 | Temp 98.9°F | Resp 16 | Wt 324.6 lb

## 2014-12-31 DIAGNOSIS — E038 Other specified hypothyroidism: Secondary | ICD-10-CM

## 2014-12-31 DIAGNOSIS — E781 Pure hyperglyceridemia: Secondary | ICD-10-CM

## 2014-12-31 DIAGNOSIS — E119 Type 2 diabetes mellitus without complications: Secondary | ICD-10-CM

## 2014-12-31 DIAGNOSIS — E669 Obesity, unspecified: Secondary | ICD-10-CM

## 2014-12-31 LAB — BASIC METABOLIC PANEL
BUN: 12 mg/dL (ref 6–23)
CHLORIDE: 101 meq/L (ref 96–112)
CO2: 26 mEq/L (ref 19–32)
Calcium: 9.7 mg/dL (ref 8.4–10.5)
Creatinine, Ser: 0.65 mg/dL (ref 0.40–1.20)
GFR: 135.59 mL/min (ref >60.00)
Glucose, Bld: 170 mg/dL — ABNORMAL HIGH (ref 70–99)
Potassium: 4 mEq/L (ref 3.5–5.1)
Sodium: 134 mEq/L — ABNORMAL LOW (ref 135–145)

## 2014-12-31 LAB — HEPATIC FUNCTION PANEL
ALK PHOS: 69 U/L (ref 39–117)
ALT: 26 U/L (ref 0–35)
AST: 18 U/L (ref 0–37)
Albumin: 4.4 g/dL (ref 3.5–5.2)
BILIRUBIN DIRECT: 0.1 mg/dL (ref 0.0–0.3)
Total Bilirubin: 0.5 mg/dL (ref 0.2–1.2)
Total Protein: 8.3 g/dL (ref 6.0–8.3)

## 2014-12-31 LAB — LIPID PANEL
CHOL/HDL RATIO: 3
Cholesterol: 123 mg/dL (ref 0–200)
HDL: 35.8 mg/dL — ABNORMAL LOW (ref >39.00)
LDL CALC: 56 mg/dL (ref 0–99)
NonHDL: 87.2
TRIGLYCERIDES: 157 mg/dL — AB (ref 0.0–149.0)
VLDL: 31.4 mg/dL (ref 0.0–40.0)

## 2014-12-31 NOTE — Assessment & Plan Note (Signed)
Chronic problem.  Now following w/ Dr Cruzita Lederer as A1C was out of control at last visit.  Pt now reports highest recent CBG was 170.  Has f/u appt w/ Endo later this month.  Is very pleased w/ provider and reports feeling much better

## 2014-12-31 NOTE — Progress Notes (Signed)
   Subjective:    Patient ID: Joanne Harris, female    DOB: 1980-11-10, 34 y.o.   MRN: [id number]  HPI Hypothyroid- chronic problem.  Pt's TSH went from 192 to 3.2 w/ improved compliance.  Pt reports feeling MUCH better and blurry vision has resolved.  'i feel a whole lot better'.  'the energy is back'.  Depressive sxs improved as physical sxs improved.  DM- chronic problem, checking CBGs regularly- highest was 170.  Now following w/ Dr Cruzita Lederer.  On Metformin and Januvia.  Hypertriglyceridemia- pt's trigs were over 500 in the setting of very high TSH and A1C.  Since endo issues have improved, due for lipid panel.  Obesity- chronic problem, has lost 8 lbs since 2/11.   Review of Systems For ROS see HPI     Objective:   Physical Exam  Constitutional: She is oriented to person, place, and time. She appears well-developed and well-nourished. No distress.  Morbidly obese  HENT:  Head: Normocephalic and atraumatic.  Eyes: Conjunctivae and EOM are normal. Pupils are equal, round, and reactive to light.  Neck: Normal range of motion. Neck supple. No thyromegaly present.  Cardiovascular: Normal rate, regular rhythm, normal heart sounds and intact distal pulses.   No murmur heard. Pulmonary/Chest: Effort normal and breath sounds normal. No respiratory distress.  Abdominal: Soft. She exhibits no distension. There is no tenderness.  Musculoskeletal: She exhibits no edema.  Lymphadenopathy:    She has no cervical adenopathy.  Neurological: She is alert and oriented to person, place, and time.  Skin: Skin is warm and dry.  Psychiatric: She has a normal mood and affect. Her behavior is normal.  Vitals reviewed.         Assessment & Plan:

## 2014-12-31 NOTE — Progress Notes (Signed)
Pre visit review using our clinic review tool, if applicable. No additional management support is needed unless otherwise documented below in the visit note. 

## 2014-12-31 NOTE — Assessment & Plan Note (Signed)
Pt has lost 8 lbs since 2/11.  Suspect this is due to normal thyroid levels.  Pt reports feeling much better- energy has improved, depression has improved.  Applauded her recent efforts.  Will continue to follow.

## 2014-12-31 NOTE — Assessment & Plan Note (Signed)
New.  Noted on last labs.  Suspect this was due to pt's very elevated TSH and A1C and that as her Endo issues improve, her trigs will follow.  Check labs today.  Start meds prn.  Pt expressed understanding and is in agreement w/ plan.

## 2014-12-31 NOTE — Assessment & Plan Note (Signed)
Chronic problem.  Hx of noncompliance.  TSH went from 192 to normal with routine use of medication.  Pt is now following w/ Dr Cruzita Lederer.  Will follow along and assist as able.

## 2014-12-31 NOTE — Patient Instructions (Signed)
Assuming all labs look good, we'll plan on scheduling your complete physical after 12/7 We'll notify you of your lab results and make any changes if needed Keep up the good work on healthy food choices- you're doing great! Add exercise as you are able Call with any questions or concerns [place]!

## 2015-01-01 ENCOUNTER — Other Ambulatory Visit: Payer: Self-pay | Admitting: General Practice

## 2015-01-01 MED ORDER — SYNTHROID 300 MCG PO TABS: ORAL_TABLET | ORAL | Status: DC

## 2015-01-17 ENCOUNTER — Encounter: Payer: Self-pay | Admitting: Internal Medicine

## 2015-01-17 ENCOUNTER — Ambulatory Visit (INDEPENDENT_AMBULATORY_CARE_PROVIDER_SITE_OTHER): Payer: PRIVATE HEALTH INSURANCE | Admitting: Internal Medicine

## 2015-01-17 VITALS — BP 104/62 | HR 110 | Temp 98.9°F | Resp 12 | Wt 325.0 lb

## 2015-01-17 DIAGNOSIS — E890 Postprocedural hypothyroidism: Secondary | ICD-10-CM | POA: Diagnosis not present

## 2015-01-17 DIAGNOSIS — E119 Type 2 diabetes mellitus without complications: Secondary | ICD-10-CM

## 2015-01-17 LAB — T4, FREE: Free T4: 1.26 ng/dL (ref 0.60–1.60)

## 2015-01-17 LAB — TSH: TSH: 0.36 u[IU]/mL (ref 0.35–4.50)

## 2015-01-17 LAB — HEMOGLOBIN A1C: Hgb A1c MFr Bld: 7.5 % — ABNORMAL HIGH (ref 4.6–6.5)

## 2015-01-17 NOTE — Progress Notes (Signed)
Patient ID: Joanne Harris, female   DOB: [birth year], 34 y.o.   MRN: [id number]  HPI: Joanne Harris is a 34 y.o.-year-old female, returning for f/u for DM2, dx in 2013, non-insulin-dependent, uncontrolled, without complications. Last visit 1.5 mo ago.  Last hemoglobin A1c was: Lab Results  Component Value Date   HGBA1C 12.6* 10/01/2014   HGBA1C 8.6* 04/10/2014   HGBA1C 6.1 08/30/2012   Pt is on a regimen of: - Metformin 1000 mg po bid - started in 09/2014 - Januvia 100 mg daily in am (added 10/2014)  Pt does check  her sugars 1-2x a day - they are approximately the same as before: - am: 137-175 >> 116-175 - 2h after b'fast: 140 >> n/c - lunch: 111-136 >> 147 - 2h after lunch: 98, 123-159, 170 >> 131-150, 200x1 - dinner: 119-125 >> 132-146, 170 - 2h  After dinner: 136, 158 >> 146, 171 - bedtime: 113, 132 >> 102, 152 No lows. Lowest 102.? if she has hypoglycemia awareness. Highest sugar was 544 >> 176 >> 200x1  Glucometer: One Touch Verio  Pt's meals are: - Breakfast: protein shake or greek yoghurt w/ fruit; honey nut Cheerios with almond or cashew milk - Lunch: protein + veggie - Dinner: protein + veggie - Snacks: 2 a day: pork rinds, cheese sticks, nuts, peperonis Exercise: dance 2x a week  - no CKD, last BUN/creatinine:  Lab Results  Component Value Date   BUN 12 12/31/2014   CREATININE 0.65 12/31/2014   - last set of lipids: Lab Results  Component Value Date   CHOL 123 12/31/2014   HDL 35.80* 12/31/2014   LDLCALC 56 12/31/2014   LDLDIRECT 130.0 10/01/2014   TRIG 157.0* 12/31/2014   CHOLHDL 3 12/31/2014   - last eye exam was in 10/31/2014. No DR.  - no numbness and tingling in her feet.  She had blurry vision, increased urination, nocturia >> improved.  Hypothyroidism: Reviewed hx:  Dx with Graves in highschool (2001) >> had RAI tx same year. Started LT4 afterwards.  She is on LT4 (Synthroid DAW) 300 mcg daily now - has previous h/o noncompliance. TFTs  normal, after she started to take the LT4 consistently.   She is taking it: - 7 am - fasting - w/ water or nothing - b'fast >1 h later - no Ca, iron, MVI, PPI  Reviewed TFTs: Lab Results  Component Value Date   TSH 3.24 11/05/2014   TSH 192.00* 10/01/2014   TSH 6.65* 04/10/2014   TSH 0.74 08/30/2012   TSH 26.08* 05/03/2012   TSH 43.69* 01/02/2011   TSH 0.82 09/02/2010   TSH 17.67* 04/24/2010   TSH 1.46 12/05/2009   TSH 6.65* 06/13/2009   FREET4 1.18 11/05/2014   FREET4 1.1 11/14/2008   ROS: Constitutional: no weight gain/loss, no fatigue, no subjective hyperthermia/hypothermia Eyes: no blurry vision, no xerophthalmia ENT: no sore throat, no nodules palpated in throat, no dysphagia/odynophagia, no hoarseness Cardiovascular: no CP/SOB/palpitations/leg swelling Respiratory: no cough/SOB Gastrointestinal: no N/V/D/C Musculoskeletal: no muscle/joint aches Skin: no rashes Neurological: no tremors/numbness/tingling/dizziness  I reviewed pt's medications, allergies, PMH, social hx, family hx, and changes were documented in the history of present illness. Otherwise, unchanged from my initial visit note:  Past Medical History  Diagnosis Date  . Hypothyroid   . Obesity   . Depression   . Diabetes mellitus    Past Surgical History  Procedure Laterality Date  . Cholecystectomy     History   Social History  . Marital Status: Single  Spouse Name: N/A    Number of Children: 0   Occupational History  . Insurance agent   Social History Main Topics  . Smoking status: Never Smoker   . Smokeless tobacco: Not on file  . Alcohol Use: Yes     Comment: occ.  . Drug Use: No   Current Outpatient Prescriptions on File Prior to Visit  Medication Sig Dispense Refill  . Ascorbic Acid (VITAMIN C) 500 MG CAPS Take 1 capsule by mouth daily.    Marland Kitchen b complex vitamins tablet Take 1 tablet by mouth daily.    Marland Kitchen FLUoxetine (PROZAC) 20 MG tablet Take 1 tablet (20 mg total) by mouth  daily. 30 tablet 3  . glucose blood (ONETOUCH VERIO) test strip Use to test blood sugar 2 times daily as instructed. 100 each 11  . metFORMIN (GLUCOPHAGE) 1000 MG tablet Take 1 tablet (1,000 mg total) by mouth 2 (two) times daily with a meal. 60 tablet 3  . ONETOUCH DELICA LANCETS FINE MISC Use to test blood sugar 2 times daily as instructed. 100 each 11  . sitaGLIPtin (JANUVIA) 100 MG tablet Take 1 tablet (100 mg total) by mouth daily. 30 tablet 2  . SYNTHROID 300 MCG tablet TAKE 1 TABLET (300 MCG TOTAL) BY MOUTH DAILY. 30 tablet 3  . Vitamin D, Cholecalciferol, 1000 UNITS CAPS Take 1 capsule by mouth daily.     No current facility-administered medications on file prior to visit.  IUD placed 06/2012. No Known Allergies Family History  Problem Relation Age of Onset  . Hypertension Mother   . Hypertension      grandmother  . Diabetes      grandmother  . Stroke      grandfather  . Breast cancer      grandmother, late 71s  . Anemia Mother    PE: BP 104/62 mmHg  Pulse 110  Temp(Src) 98.9 F (37.2 C) (Oral)  Resp 12  Wt 325 lb (147.419 kg)  SpO2 95% Body mass index is 56.66 kg/(m^2). Wt Readings from Last 3 Encounters:  01/17/15 325 lb (147.419 kg)  12/31/14 324 lb 9 oz (147.221 kg)  12/06/14 332 lb (150.594 kg)   Constitutional: obese, in NAD Eyes: PERRLA, EOMI, no exophthalmos ENT: moist mucous membranes, no thyromegaly, no cervical lymphadenopathy Cardiovascular: Tachycardia, RR, No MRG Respiratory: CTA B Gastrointestinal: abdomen soft, NT, ND, BS+ Musculoskeletal: no deformities, strength intact in all 4 Skin: moist, warm, no rashes; + acanthosis nigricans on neck Neurological: no tremor with outstretched hands, DTR normal in all 4  ASSESSMENT: 1. DM2, non-insulin-dependent, uncontrolled, without complications  2. Postablative hypothyroidism - h/o med noncompliance  PLAN:  1. Patient with 3 year h/o diabetes, recently uncontrolled, started on oral antidiabetic  regimen. Sugars much improved since the last hemoglobin A1c, which was very high. She has higher sugars in am (snacks at night), but she already pinpointed some problems in her diet that can cause her sugars to be high. She noticed that 100 Cheerios greatly raise her sugars. I will refer her to nutrition for more help fine-tuning her diet. I think this might be enough to improve her sugars, even if we stay on the same regimen. We discussed briefly about Invokana. I'm not in a hurry to add this, especially with her tachycardia.  - I suggested to:  Patient Instructions  Pease continue Metformin 1000 mg 2x a day. Continue Januvia 100 mg in am.  Please return in 1.5 months with your sugar log.  Please schedule an appt with Antonieta Iba with nutrition.  Please let me know if the sugars are consistently <80 or >200.  - continue checking sugars at different times of the day - check 2 times a day, rotating checks - she is UTD with yearly eye exams -Will check a hemoglobin A1c today  - Return to clinic in 1.5 mo with sugar log   2. Postablative hypothyroidism - reinforced compliance with LT4 - she is now compliant with this - continue take the thyroid hormone every day, with water, >30 minutes before breakfast, separated by >4 hours from acid reflux medications, calcium, iron, multivitamins. - She is on LT4 300 mcg >> latest TFTs normal  - Will check TFTtoday - for now continue LT4 300 mcg daily.   Office Visit on 01/17/2015  Component Date Value Ref Range Status  . Hgb A1c MFr Bld 01/17/2015 7.5* 4.6 - 6.5 % Final   Glycemic Control Guidelines for People with Diabetes:Non Diabetic:  <6%Goal of Therapy: <7%Additional Action Suggested:  >8%   . TSH 01/17/2015 0.36  0.35 - 4.50 uIU/mL Final  . Free T4 01/17/2015 1.26  0.60 - 1.60 ng/dL Final   TFTs normal, but need to recheck at next visit, as TSH close to the LLN. HbA1c much improved!

## 2015-01-17 NOTE — Patient Instructions (Signed)
Pease continue Metformin 1000 mg 2x a day. Continue Januvia 100 mg in am.  Please return in 1.5 months with your sugar log.   Please schedule an appt with Antonieta Iba with nutrition.  Please let me know if the sugars are consistently <80 or >200.

## 2015-01-22 ENCOUNTER — Ambulatory Visit (INDEPENDENT_AMBULATORY_CARE_PROVIDER_SITE_OTHER): Payer: PRIVATE HEALTH INSURANCE | Admitting: Family Medicine

## 2015-01-22 ENCOUNTER — Encounter: Payer: Self-pay | Admitting: Family Medicine

## 2015-01-22 VITALS — BP 122/80 | HR 105 | Temp 98.5°F | Wt 320.0 lb

## 2015-01-22 DIAGNOSIS — J069 Acute upper respiratory infection, unspecified: Secondary | ICD-10-CM

## 2015-01-22 DIAGNOSIS — J029 Acute pharyngitis, unspecified: Secondary | ICD-10-CM | POA: Diagnosis not present

## 2015-01-22 DIAGNOSIS — J208 Acute bronchitis due to other specified organisms: Secondary | ICD-10-CM | POA: Diagnosis not present

## 2015-01-22 LAB — POCT RAPID STREP A (OFFICE): Rapid Strep A Screen: NEGATIVE

## 2015-01-22 MED ORDER — FLUTICASONE PROPIONATE 50 MCG/ACT NA SUSP: 2.0000 | Freq: Every day | NASAL | Status: DC

## 2015-01-22 MED ORDER — PROMETHAZINE-DM 6.25-15 MG/5ML PO SYRP: 5.0000 mL | ORAL_SOLUTION | Freq: Four times a day (QID) | ORAL | Status: DC | PRN

## 2015-01-22 MED ORDER — AZITHROMYCIN 250 MG PO TABS: ORAL_TABLET | ORAL | Status: DC

## 2015-01-22 NOTE — Progress Notes (Signed)
Pre visit review using our clinic review tool, if applicable. No additional management support is needed unless otherwise documented below in the visit note. 

## 2015-01-22 NOTE — Patient Instructions (Signed)

## 2015-01-22 NOTE — Progress Notes (Signed)
  Subjective:     Joanne Harris is a 34 y.o. female who presents for evaluation of sore throat. Associated symptoms include chest congestion, nasal blockage, pain while swallowing, post nasal drip, sinus and nasal congestion, sore throat and swollen glands. Onset of symptoms was 7 days ago, and have been gradually worsening since that time. She is drinking plenty of fluids. She has not had a recent close exposure to someone with proven streptococcal pharyngitis.  Cough is keeping her awake at night.  Using mucinex, nyquil with no relief.   The following portions of the patient's history were reviewed and updated as appropriate:  She  has a past medical history of Hypothyroid; Obesity; Depression; and Diabetes mellitus. She  does not have any pertinent problems on file. She  has past surgical history that includes Cholecystectomy. Her family history includes Anemia in her mother; Breast cancer in an other family member; Diabetes in an other family member; Hypertension in her mother and another family member; Stroke in an other family member. She  reports that she has never smoked. She does not have any smokeless tobacco history on file. She reports that she drinks alcohol. She reports that she does not use illicit drugs. She has a current medication list which includes the following prescription(s): vitamin c, b complex vitamins, fluoxetine, glucose blood, metformin, onetouch delica lancets fine, sitagliptin, synthroid, and vitamin d (cholecalciferol). Current Outpatient Prescriptions on File Prior to Visit  Medication Sig Dispense Refill  . Ascorbic Acid (VITAMIN C) 500 MG CAPS Take 1 capsule by mouth daily.    Marland Kitchen b complex vitamins tablet Take 1 tablet by mouth daily.    Marland Kitchen FLUoxetine (PROZAC) 20 MG tablet Take 1 tablet (20 mg total) by mouth daily. 30 tablet 3  . glucose blood (ONETOUCH VERIO) test strip Use to test blood sugar 2 times daily as instructed. 100 each 11  . metFORMIN (GLUCOPHAGE) 1000  MG tablet Take 1 tablet (1,000 mg total) by mouth 2 (two) times daily with a meal. 60 tablet 3  . ONETOUCH DELICA LANCETS FINE MISC Use to test blood sugar 2 times daily as instructed. 100 each 11  . sitaGLIPtin (JANUVIA) 100 MG tablet Take 1 tablet (100 mg total) by mouth daily. 30 tablet 2  . SYNTHROID 300 MCG tablet TAKE 1 TABLET (300 MCG TOTAL) BY MOUTH DAILY. 30 tablet 3  . Vitamin D, Cholecalciferol, 1000 UNITS CAPS Take 1 capsule by mouth daily.     No current facility-administered medications on file prior to visit.   She has No Known Allergies..  Review of Systems Pertinent items are noted in HPI.    Objective:    BP 122/80 mmHg  Pulse 105  Temp(Src) 98.5 F (36.9 C)  Wt 320 lb (145.151 kg)  SpO2 100% General appearance: alert, cooperative, appears stated age and no distress Ears: normal TM's and external ear canals both ears Nose: green and yellow discharge, moderate congestion, turbinates red, swollen, sinus tenderness bilateral Throat: abnormal findings: moderate oropharyngeal erythema-- pnd Neck: moderate anterior cervical adenopathy, supple, symmetrical, trachea midline and thyroid not enlarged, symmetric, no tenderness/mass/nodules Lungs: clear to auscultation bilaterally Heart: S1, S2 normal  Laboratory Strep test done. Results:negative.    Assessment:    Acute pharyngitis, likely  Sinusitis with post nasal drip and bronchitis.    Plan:    Patient placed on antibiotics. Follow up as needed.   Antihistamines Cough med per orders flonase

## 2015-01-25 ENCOUNTER — Other Ambulatory Visit: Payer: Self-pay | Admitting: Family Medicine

## 2015-01-25 DIAGNOSIS — J208 Acute bronchitis due to other specified organisms: Secondary | ICD-10-CM

## 2015-01-25 MED ORDER — PROMETHAZINE-DM 6.25-15 MG/5ML PO SYRP: 5.0000 mL | ORAL_SOLUTION | Freq: Four times a day (QID) | ORAL | Status: DC | PRN

## 2015-01-25 NOTE — Telephone Encounter (Signed)
Last seen and filled 01/22/15. Please advise     KP

## 2015-01-28 ENCOUNTER — Other Ambulatory Visit: Payer: Self-pay | Admitting: Family Medicine

## 2015-01-28 DIAGNOSIS — J208 Acute bronchitis due to other specified organisms: Secondary | ICD-10-CM

## 2015-02-04 ENCOUNTER — Other Ambulatory Visit: Payer: Self-pay | Admitting: Family Medicine

## 2015-02-04 NOTE — Telephone Encounter (Signed)
Med filled.  

## 2015-02-28 ENCOUNTER — Ambulatory Visit: Payer: PRIVATE HEALTH INSURANCE | Admitting: Internal Medicine

## 2015-03-05 ENCOUNTER — Ambulatory Visit (INDEPENDENT_AMBULATORY_CARE_PROVIDER_SITE_OTHER): Payer: PRIVATE HEALTH INSURANCE | Admitting: Family Medicine

## 2015-03-05 ENCOUNTER — Encounter: Payer: Self-pay | Admitting: Family Medicine

## 2015-03-05 ENCOUNTER — Telehealth: Payer: Self-pay | Admitting: Family Medicine

## 2015-03-05 ENCOUNTER — Other Ambulatory Visit (HOSPITAL_COMMUNITY)
Admission: RE | Admit: 2015-03-05 | Discharge: 2015-03-05 | Disposition: A | Discharge status: Home / Self Care | Payer: PRIVATE HEALTH INSURANCE | Source: Ambulatory Visit | Attending: Family Medicine | Admitting: Family Medicine

## 2015-03-05 VITALS — BP 142/90 | HR 103 | Temp 98.6°F | Wt 321.2 lb

## 2015-03-05 DIAGNOSIS — R300 Dysuria: Secondary | ICD-10-CM

## 2015-03-05 DIAGNOSIS — E119 Type 2 diabetes mellitus without complications: Secondary | ICD-10-CM

## 2015-03-05 DIAGNOSIS — R81 Glycosuria: Secondary | ICD-10-CM | POA: Diagnosis not present

## 2015-03-05 DIAGNOSIS — N760 Acute vaginitis: Secondary | ICD-10-CM

## 2015-03-05 LAB — POCT URINALYSIS DIPSTICK
Bilirubin, UA: NEGATIVE
Blood, UA: NEGATIVE
KETONES UA: NEGATIVE
LEUKOCYTES UA: NEGATIVE
Nitrite, UA: NEGATIVE
PH UA: 6
Protein, UA: NEGATIVE
Spec Grav, UA: 1.025
UROBILINOGEN UA: 2

## 2015-03-05 LAB — GLUCOSE, POCT (MANUAL RESULT ENTRY): POC GLUCOSE: 412 mg/dL — AB (ref 70–99)

## 2015-03-05 MED ORDER — FLUCONAZOLE 150 MG PO TABS: ORAL_TABLET | ORAL | Status: DC

## 2015-03-05 NOTE — Telephone Encounter (Signed)
Appointment scheduled.

## 2015-03-05 NOTE — Patient Instructions (Addendum)

## 2015-03-05 NOTE — Telephone Encounter (Signed)
Caller name: Vonnie Relation to pt: self Call back number: [phone] Pharmacy: [facility] on [address]  Reason for call:   Patient states that she has a yeast infection and is requesting that diflucan be called in for her.

## 2015-03-05 NOTE — Telephone Encounter (Signed)
Pt needs an appointment has not been seen since March. We cannot send in this medication without seeing her.

## 2015-03-05 NOTE — Progress Notes (Signed)
Pre visit review using our clinic review tool, if applicable. No additional management support is needed unless otherwise documented below in the visit note. 

## 2015-03-06 MED ORDER — INSULIN REGULAR HUMAN 100 UNIT/ML IJ SOLN: INTRAMUSCULAR | Status: DC

## 2015-03-06 NOTE — Assessment & Plan Note (Signed)
Glucose > 400  Pt given sliding scale to use insulin if needed Pt to f/u with endo soon con't current meds Encouraged pt to follow ada diet given by nutrition

## 2015-03-06 NOTE — Progress Notes (Signed)
Patient ID: Joanne Harris, female    DOB: 01-21-1981  Age: 34 y.o. MRN: [id number]    Subjective:  Subjective HPI Joanne Harris presents for c/o vaginal itcing and d/c.  Pt also c/co elevated sugars.  She admits to not eating great today but states her glucose readings have been good at home.    Review of Systems  Constitutional: Negative for fever, chills, activity change and appetite change.  Gastrointestinal: Negative for abdominal pain and abdominal distention.  Genitourinary: Positive for dysuria, urgency, frequency and vaginal discharge. Negative for hematuria, flank pain, vaginal bleeding, difficulty urinating, genital sores, vaginal pain, menstrual problem, pelvic pain and dyspareunia.  Musculoskeletal: Negative for back pain.  Psychiatric/Behavioral: Negative for decreased concentration. The patient is not nervous/anxious.     History Past Medical History  Diagnosis Date  . Hypothyroid   . Obesity   . Depression   . Diabetes mellitus     She has past surgical history that includes Cholecystectomy.   Her family history includes Anemia in her mother; Breast cancer in an other family member; Diabetes in an other family member; Hypertension in her mother and another family member; Stroke in an other family member.She reports that she has never smoked. She does not have any smokeless tobacco history on file. She reports that she drinks alcohol. She reports that she does not use illicit drugs.  Current Outpatient Prescriptions on File Prior to Visit  Medication Sig Dispense Refill  . Ascorbic Acid (VITAMIN C) 500 MG CAPS Take 1 capsule by mouth daily.    Marland Kitchen b complex vitamins tablet Take 1 tablet by mouth daily.    Marland Kitchen FLUoxetine (PROZAC) 20 MG tablet Take 1 tablet (20 mg total) by mouth daily. 30 tablet 3  . fluticasone (FLONASE) 50 MCG/ACT nasal spray Place 2 sprays into both nostrils daily. 16 g 6  . glucose blood (ONETOUCH VERIO) test strip Use to test blood sugar 2 times daily  as instructed. 100 each 11  . metFORMIN (GLUCOPHAGE) 1000 MG tablet TAKE 1 TABLET (1,000 MG TOTAL) BY MOUTH 2 (TWO) TIMES DAILY WITH A MEAL. 60 tablet 3  . ONETOUCH DELICA LANCETS FINE MISC Use to test blood sugar 2 times daily as instructed. 100 each 11  . sitaGLIPtin (JANUVIA) 100 MG tablet Take 1 tablet (100 mg total) by mouth daily. 30 tablet 2  . SYNTHROID 300 MCG tablet TAKE 1 TABLET (300 MCG TOTAL) BY MOUTH DAILY. 30 tablet 3  . Vitamin D, Cholecalciferol, 1000 UNITS CAPS Take 1 capsule by mouth daily.     No current facility-administered medications on file prior to visit.     Objective:  Objective Physical Exam  Constitutional: She appears well-developed and well-nourished. No distress.  Abdominal: Soft. She exhibits no distension. There is no tenderness. There is no rebound, no guarding and no CVA tenderness.  Genitourinary: Pelvic exam was performed with patient supine. There is no rash, tenderness, lesion or injury on the right labia. There is no rash, tenderness, lesion or injury on the left labia. Cervix exhibits discharge. Cervix exhibits no motion tenderness and no friability. There is erythema in the vagina. No tenderness in the vagina. Vaginal discharge found.  Skin: She is not diaphoretic.   BP 142/90 mmHg  Pulse 103  Temp(Src) 98.6 F (37 C) (Oral)  Wt 321 lb 3.2 oz (145.695 kg)  SpO2 97% Wt Readings from Last 3 Encounters:  03/05/15 321 lb 3.2 oz (145.695 kg)  01/22/15 320 lb (145.151 kg)  01/17/15 325  lb (147.419 kg)     Lab Results  Component Value Date   WBC 14.8* 10/01/2014   HGB 14.4 10/01/2014   HCT 44.1 10/01/2014   PLT 420.0* 10/01/2014   GLUCOSE 170* 12/31/2014   CHOL 123 12/31/2014   TRIG 157.0* 12/31/2014   HDL 35.80* 12/31/2014   LDLDIRECT 130.0 10/01/2014   LDLCALC 56 12/31/2014   ALT 26 12/31/2014   AST 18 12/31/2014   NA 134* 12/31/2014   K 4.0 12/31/2014   CL 101 12/31/2014   CREATININE 0.65 12/31/2014   BUN 12 12/31/2014   CO2 26  12/31/2014   TSH 0.36 01/17/2015   HGBA1C 7.5* 01/17/2015    No results found.   Assessment & Plan:  Plan I have discontinued Ms. Dugue azithromycin and promethazine-dextromethorphan. I am also having her start on fluconazole and insulin regular. Additionally, I am having her maintain her FLUoxetine, b complex vitamins, Vitamin C, sitaGLIPtin, glucose blood, ONETOUCH DELICA LANCETS FINE, Vitamin D (Cholecalciferol), SYNTHROID, fluticasone, and metFORMIN.  Meds ordered this encounter  Medications  . fluconazole (DIFLUCAN) 150 MG tablet    Sig: 1 po qd x 1, may repeat in 3 days prn    Dispense:  2 tablet    Refill:  0  . insulin regular (HUMULIN R) 100 units/mL injection    Sig: As directed with sliding scale    Dispense:  10 mL    Refill:  1    Problem List Items Addressed This Visit    Diabetes mellitus, type 2    Glucose > 400  Pt given sliding scale to use insulin if needed Pt to f/u with endo soon con't current meds Encouraged pt to follow ada diet given by nutrition      Relevant Medications   insulin regular (HUMULIN R) 100 units/mL injection    Other Visit Diagnoses    Dysuria    -  Primary    Relevant Orders    POCT Urinalysis Dipstick (Completed)    Glucosuria        Relevant Orders    POCT Glucose (CBG) (Completed)    Vaginitis and vulvovaginitis        Relevant Medications    fluconazole (DIFLUCAN) 150 MG tablet    Other Relevant Orders    Cervicovaginal ancillary only       Follow-up: Return if symptoms worsen or fail to improve.  Garnet Koyanagi, DO

## 2015-03-07 LAB — CERVICOVAGINAL ANCILLARY ONLY: Wet Prep (BD Affirm): POSITIVE — AB

## 2015-03-29 ENCOUNTER — Ambulatory Visit (INDEPENDENT_AMBULATORY_CARE_PROVIDER_SITE_OTHER): Payer: PRIVATE HEALTH INSURANCE | Admitting: Internal Medicine

## 2015-03-29 ENCOUNTER — Encounter: Payer: Self-pay | Admitting: Internal Medicine

## 2015-03-29 VITALS — BP 110/62 | HR 100 | Temp 99.4°F | Resp 12 | Wt 328.0 lb

## 2015-03-29 DIAGNOSIS — E1165 Type 2 diabetes mellitus with hyperglycemia: Secondary | ICD-10-CM

## 2015-03-29 DIAGNOSIS — E038 Other specified hypothyroidism: Secondary | ICD-10-CM

## 2015-03-29 LAB — HEMOGLOBIN A1C: Hgb A1c MFr Bld: 8.8 % — ABNORMAL HIGH (ref 4.6–6.5)

## 2015-03-29 MED ORDER — METFORMIN HCL 1000 MG PO TABS: ORAL_TABLET | ORAL | Status: DC

## 2015-03-29 NOTE — Progress Notes (Signed)
Patient ID: Joanne Harris, female   DOB: 1981/02/05, 34 y.o.   MRN: [id number]  HPI: Joanne Harris is a 34 y.o.-year-old female, returning for f/u for DM2, dx in 2013, non-insulin-dependent, uncontrolled, without complications. Last visit 2.5 mo ago.  Last hemoglobin A1c was: Lab Results  Component Value Date   HGBA1C 7.5* 01/17/2015   HGBA1C 12.6* 10/01/2014   HGBA1C 8.6* 04/10/2014   Pt is on a regimen of: - Metformin 1000 mg po bid - started in 09/2014 - Januvia 100 mg daily in am (added 10/2014)  Pt does check  her sugars 1-2x a day - they are approximately the same as before, except she had 200s-400s with a yeast infection >> resolved: - am: 137-175 >> 116-175 >> n/c - 2h after b'fast: 140 >> n/c>> 120-165, 189 - lunch: 111-136 >> 147 >> 97-119 - 2h after lunch: 98, 123-159, 170 >> 131-150, 200x1 >> n/c - dinner: 119-125 >> 132-146, 170 >> n/c - 2h  After dinner: 136, 158 >> 146, 171 >> 96-159, 183 - bedtime: 113, 132 >> 102, 152>> n/c No lows. Lowest 102.? if she has hypoglycemia awareness. Highest sugar was 544 >> 176 >> 200x1 >> 400.  Glucometer: One Touch Verio  Pt's meals are: - Breakfast: protein shake or greek yoghurt w/ fruit; honey nut Cheerios with almond or cashew milk - Lunch: protein + veggie - Dinner: protein + veggie - Snacks: 2 a day: pork rinds, cheese sticks, nuts, peperonis Exercise: dance 2x a week  - no CKD, last BUN/creatinine:  Lab Results  Component Value Date   BUN 12 12/31/2014   CREATININE 0.65 12/31/2014   - last set of lipids: Lab Results  Component Value Date   CHOL 123 12/31/2014   HDL 35.80* 12/31/2014   LDLCALC 56 12/31/2014   LDLDIRECT 130.0 10/01/2014   TRIG 157.0* 12/31/2014   CHOLHDL 3 12/31/2014   - last eye exam was in 10/31/2014. No DR.  - no numbness and tingling in her feet.  She had blurry vision, increased urination, nocturia >> improved.  Hypothyroidism: Reviewed hx:  Dx with Graves in highschool (2001) >> had  RAI tx same year. Started LT4 afterwards.  She is on LT4 (Synthroid DAW) 300 mcg daily now - has previous h/o noncompliance. TFTs normal, after she started to take the LT4 consistently.   She is taking it: - 7 am - fasting - w/ water or nothing - b'fast >1 h later - no Ca, iron, MVI, PPI  She missed 2.5 weeks of LT4 in 01/2015.  Reviewed TFTs: Lab Results  Component Value Date   TSH 0.36 01/17/2015   TSH 3.24 11/05/2014   TSH 192.00* 10/01/2014   TSH 6.65* 04/10/2014   TSH 0.74 08/30/2012   TSH 26.08* 05/03/2012   TSH 43.69* 01/02/2011   TSH 0.82 09/02/2010   TSH 17.67* 04/24/2010   TSH 1.46 12/05/2009   FREET4 1.26 01/17/2015   FREET4 1.18 11/05/2014   FREET4 1.1 11/14/2008   ROS: Constitutional: no weight gain/loss, no fatigue, no subjective hyperthermia/hypothermia Eyes: no blurry vision, no xerophthalmia ENT: no sore throat, no nodules palpated in throat, no dysphagia/odynophagia, no hoarseness Cardiovascular: no CP/SOB/palpitations/leg swelling Respiratory: no cough/SOB Gastrointestinal: no N/V/D/C Musculoskeletal: no muscle/+ joint aches Skin: no rashes Neurological: no tremors/numbness/tingling/dizziness  I reviewed pt's medications, allergies, PMH, social hx, family hx, and changes were documented in the history of present illness. Otherwise, unchanged from my initial visit note:  Past Medical History  Diagnosis Date  . Hypothyroid   .  Obesity   . Depression   . Diabetes mellitus    Past Surgical History  Procedure Laterality Date  . Cholecystectomy     History   Social History  . Marital Status: Single    Spouse Name: N/A    Number of Children: 0   Occupational History  . Insurance agent   Social History Main Topics  . Smoking status: Never Smoker   . Smokeless tobacco: Not on file  . Alcohol Use: Yes     Comment: occ.  . Drug Use: No   Current Outpatient Prescriptions on File Prior to Visit  Medication Sig Dispense Refill  . Ascorbic  Acid (VITAMIN C) 500 MG CAPS Take 1 capsule by mouth daily.    Marland Kitchen b complex vitamins tablet Take 1 tablet by mouth daily.    . fluconazole (DIFLUCAN) 150 MG tablet 1 po qd x 1, may repeat in 3 days prn 2 tablet 0  . FLUoxetine (PROZAC) 20 MG tablet Take 1 tablet (20 mg total) by mouth daily. 30 tablet 3  . fluticasone (FLONASE) 50 MCG/ACT nasal spray Place 2 sprays into both nostrils daily. 16 g 6  . glucose blood (ONETOUCH VERIO) test strip Use to test blood sugar 2 times daily as instructed. 100 each 11  . insulin regular (HUMULIN R) 100 units/mL injection As directed with sliding scale 10 mL 1  . metFORMIN (GLUCOPHAGE) 1000 MG tablet TAKE 1 TABLET (1,000 MG TOTAL) BY MOUTH 2 (TWO) TIMES DAILY WITH A MEAL. 60 tablet 3  . ONETOUCH DELICA LANCETS FINE MISC Use to test blood sugar 2 times daily as instructed. 100 each 11  . sitaGLIPtin (JANUVIA) 100 MG tablet Take 1 tablet (100 mg total) by mouth daily. 30 tablet 2  . SYNTHROID 300 MCG tablet TAKE 1 TABLET (300 MCG TOTAL) BY MOUTH DAILY. 30 tablet 3  . Vitamin D, Cholecalciferol, 1000 UNITS CAPS Take 1 capsule by mouth daily.     No current facility-administered medications on file prior to visit.  IUD placed 06/2012. No Known Allergies Family History  Problem Relation Age of Onset  . Hypertension Mother   . Hypertension      grandmother  . Diabetes      grandmother  . Stroke      grandfather  . Breast cancer      grandmother, late 4s  . Anemia Mother    PE: BP 110/62 mmHg  Pulse 100  Temp(Src) 99.4 F (37.4 C) (Oral)  Resp 12  Wt 328 lb (148.78 kg)  SpO2 95% Body mass index is 57.18 kg/(m^2). Wt Readings from Last 3 Encounters:  03/29/15 328 lb (148.78 kg)  03/05/15 321 lb 3.2 oz (145.695 kg)  01/22/15 320 lb (145.151 kg)   Constitutional: obese, in NAD Eyes: PERRLA, EOMI, no exophthalmos ENT: moist mucous membranes, no thyromegaly, no cervical lymphadenopathy Cardiovascular: Tachycardia, RR, No MRG Respiratory: CTA  B Gastrointestinal: abdomen soft, NT, ND, BS+ Musculoskeletal: no deformities, strength intact in all 4 Skin: moist, warm, no rashes; + acanthosis nigricans on neck Neurological: no tremor with outstretched hands, DTR normal in all 4  ASSESSMENT: 1. DM2, non-insulin-dependent, uncontrolled, without complications  2. Postablative hypothyroidism - h/o med noncompliance  PLAN:  1. Patient with 3 year h/o diabetes, uncontrolled recently 2/2 yeast infection >> now again, improving. Sugars a little higher in am, at goal in the rest of the day. Will continue current regimen. - I suggested to:  Patient Instructions  Pease continue Metformin 1000 mg  2x a day. Continue Januvia 100 mg in am.  Please return in 3 months with your sugar log.   Please stop at the lab.  - continue checking sugars at different times of the day - check 2 times a day, rotating checks - she is UTD with yearly eye exams - Will check a hemoglobin A1c today  - Return to clinic in 3 mo with sugar log   2. Postablative hypothyroidism - reinforced compliance with LT4 - she missed 2.5 weeks in April >> will recheck TFTs at next visit - continue take the thyroid hormone every day, with water, >30 minutes before breakfast, separated by >4 hours from acid reflux medications, calcium, iron, multivitamins. - She is on LT4 300 mcg >> latest TFTs normal  - for now continue LT4 300 mcg daily.   Office Visit on 03/29/2015  Component Date Value Ref Range Status  . Hgb A1c MFr Bld 03/29/2015 8.8* 4.6 - 6.5 % Final   Glycemic Control Guidelines for People with Diabetes:Non Diabetic:  <6%Goal of Therapy: <7%Additional Action Suggested:  >8%    HbA1c increased, as expected.

## 2015-03-29 NOTE — Patient Instructions (Signed)
Pease continue Metformin 1000 mg 2x a day. Continue Januvia 100 mg in am.  Please return in 3 months with your sugar log.

## 2015-06-20 ENCOUNTER — Telehealth: Payer: Self-pay | Admitting: Internal Medicine

## 2015-06-20 ENCOUNTER — Encounter: Payer: Self-pay | Admitting: Family Medicine

## 2015-06-20 ENCOUNTER — Other Ambulatory Visit: Payer: Self-pay | Admitting: Family Medicine

## 2015-06-20 DIAGNOSIS — N760 Acute vaginitis: Secondary | ICD-10-CM

## 2015-06-20 MED ORDER — FLUCONAZOLE 150 MG PO TABS: 150.0000 mg | ORAL_TABLET | Freq: Once | ORAL | Status: DC

## 2015-06-20 NOTE — Addendum Note (Signed)
Addended by: Rockie Neighbours B on: 06/20/2015 02:43 PM   Modules accepted: Orders

## 2015-06-20 NOTE — Telephone Encounter (Signed)
Please read message below and advise.  

## 2015-06-20 NOTE — Telephone Encounter (Signed)
Cannot fill without patient being seen.

## 2015-06-20 NOTE — Telephone Encounter (Signed)
Pt called and wants to know if she can get a script for dislucan, call pt back [phone]

## 2015-06-20 NOTE — Telephone Encounter (Signed)
Yes, 150 mg #1 with 1 refill

## 2015-07-04 ENCOUNTER — Ambulatory Visit: Payer: PRIVATE HEALTH INSURANCE | Admitting: Internal Medicine

## 2015-07-08 ENCOUNTER — Encounter: Payer: Self-pay | Admitting: Internal Medicine

## 2015-07-08 ENCOUNTER — Ambulatory Visit (INDEPENDENT_AMBULATORY_CARE_PROVIDER_SITE_OTHER): Payer: PRIVATE HEALTH INSURANCE | Admitting: Internal Medicine

## 2015-07-08 VITALS — BP 128/84 | HR 100 | Temp 99.4°F | Resp 16 | Ht 63.0 in | Wt 312.0 lb

## 2015-07-08 DIAGNOSIS — E1165 Type 2 diabetes mellitus with hyperglycemia: Secondary | ICD-10-CM | POA: Diagnosis not present

## 2015-07-08 DIAGNOSIS — E038 Other specified hypothyroidism: Secondary | ICD-10-CM | POA: Diagnosis not present

## 2015-07-08 NOTE — Progress Notes (Signed)
Patient ID: Joanne Harris, female   DOB: 1981-04-03, 34 y.o.   MRN: [id number]  HPI: Joanne Harris is a 34 y.o.-year-old female, returning for f/u for DM2, dx in 2013, non-insulin-dependent, uncontrolled, without complications. Last visit 3 mo ago.  She lost 16 lbs since last visit.   Last hemoglobin A1c was: Lab Results  Component Value Date   HGBA1C 8.8* 03/29/2015   HGBA1C 7.5* 01/17/2015   HGBA1C 12.6* 10/01/2014   Pt is on a regimen of: - Metformin 1000 mg po bid - started in 09/2014 - Januvia 100 mg daily in am (added 10/2014)  Pt does check  her sugars 1-2x a day - did not check sugars in 05/2015: - am: 137-175 >> 116-175 >> n/c >> 117-120 - 2h after b'fast: 140 >> n/c>> 120-165, 189 >> n/c - lunch: 111-136 >> 147 >> 97-119 >> n/c - 2h after lunch: 98, 123-159, 170 >> 131-150, 200x1 >> n/c - dinner: 119-125 >> 132-146, 170 >> n/c - 2h  After dinner: 136, 158 >> 146, 171 >> 96-159, 183 >> 132-160 - bedtime: 113, 132 >> 102, 152>> n/c No lows. Lowest 102.? if she has hypoglycemia awareness. Highest sugar was 544 >> 176 >> 200x1 >> 400 >> 201  Glucometer: One Touch Verio  Pt's meals are: - Breakfast: protein shake or greek yoghurt w/ fruit; honey nut Cheerios with almond or cashew milk - Lunch: protein + veggie - Dinner: protein + veggie - Snacks: 2 a day: pork rinds, cheese sticks, nuts, peperonis Exercise: dance 2x a week  - no CKD, last BUN/creatinine:  Lab Results  Component Value Date   BUN 12 12/31/2014   CREATININE 0.65 12/31/2014   - last set of lipids: Lab Results  Component Value Date   CHOL 123 12/31/2014   HDL 35.80* 12/31/2014   LDLCALC 56 12/31/2014   LDLDIRECT 130.0 10/01/2014   TRIG 157.0* 12/31/2014   CHOLHDL 3 12/31/2014   - last eye exam was 09/25/2014. No DR.  - no numbness and tingling in her feet.  She had blurry vision, increased urination. + nocturia.  Hypothyroidism: Reviewed hx:  Dx with Graves in highschool (2001) >> had RAI  tx same year. Started LT4 afterwards.  She is on LT4 (Synthroid DAW) 300 mcg daily now - has previous h/o noncompliance. TFTs normal, after she started to take the LT4 consistently.   She is taking it: - 7 am - fasting - w/ water or nothing - b'fast >1 h later - no Ca, iron, MVI, PPI  Reviewed TFTs: Lab Results  Component Value Date   TSH 0.36 01/17/2015   TSH 3.24 11/05/2014   TSH 192.00* 10/01/2014   TSH 6.65* 04/10/2014   TSH 0.74 08/30/2012   TSH 26.08* 05/03/2012   TSH 43.69* 01/02/2011   TSH 0.82 09/02/2010   TSH 17.67* 04/24/2010   TSH 1.46 12/05/2009   FREET4 1.26 01/17/2015   FREET4 1.18 11/05/2014   FREET4 1.1 11/14/2008   ROS: Constitutional: no weight gain/loss, + fatigue, no subjective hyperthermia/hypothermia, + nocturia. Eyes: no blurry vision, no xerophthalmia ENT: no sore throat, no nodules palpated in throat, no dysphagia/odynophagia, no hoarseness Cardiovascular: no CP/SOB/palpitations/leg swelling Respiratory: no cough/SOB Gastrointestinal: no N/V/D/C Musculoskeletal: no muscle/+ joint aches Skin: no rashes Neurological: no tremors/numbness/tingling/dizziness  I reviewed pt's medications, allergies, PMH, social hx, family hx, and changes were documented in the history of present illness. Otherwise, unchanged from my initial visit note:  Past Medical History  Diagnosis Date  . Hypothyroid   .  Obesity   . Depression   . Diabetes mellitus    Past Surgical History  Procedure Laterality Date  . Cholecystectomy     History   Social History  . Marital Status: Single    Spouse Name: N/A    Number of Children: 0   Occupational History  . Insurance agent   Social History Main Topics  . Smoking status: Never Smoker   . Smokeless tobacco: Not on file  . Alcohol Use: Yes     Comment: occ.  . Drug Use: No   Current Outpatient Prescriptions on File Prior to Visit  Medication Sig Dispense Refill  . Ascorbic Acid (VITAMIN C) 500 MG CAPS Take 1  capsule by mouth daily.    Marland Kitchen b complex vitamins tablet Take 1 tablet by mouth daily.    . fluconazole (DIFLUCAN) 150 MG tablet Take 1 tablet (150 mg total) by mouth once. 1 tablet 1  . FLUoxetine (PROZAC) 20 MG tablet Take 1 tablet (20 mg total) by mouth daily. 30 tablet 3  . fluticasone (FLONASE) 50 MCG/ACT nasal spray Place 2 sprays into both nostrils daily. 16 g 6  . glucose blood (ONETOUCH VERIO) test strip Use to test blood sugar 2 times daily as instructed. 100 each 11  . insulin regular (HUMULIN R) 100 units/mL injection As directed with sliding scale 10 mL 1  . metFORMIN (GLUCOPHAGE) 1000 MG tablet TAKE 1 TABLET (1,000 MG TOTAL) BY MOUTH 2 (TWO) TIMES DAILY WITH A MEAL. 180 tablet 1  . ONETOUCH DELICA LANCETS FINE MISC Use to test blood sugar 2 times daily as instructed. 100 each 11  . sitaGLIPtin (JANUVIA) 100 MG tablet Take 1 tablet (100 mg total) by mouth daily. 30 tablet 2  . SYNTHROID 300 MCG tablet TAKE 1 TABLET (300 MCG TOTAL) BY MOUTH DAILY. 30 tablet 3  . Vitamin D, Cholecalciferol, 1000 UNITS CAPS Take 1 capsule by mouth daily.     No current facility-administered medications on file prior to visit.  IUD placed 06/2012. No Known Allergies Family History  Problem Relation Age of Onset  . Hypertension Mother   . Hypertension      grandmother  . Diabetes      grandmother  . Stroke      grandfather  . Breast cancer      grandmother, late 30s  . Anemia Mother    PE: BP 128/84 mmHg  Pulse 100  Temp(Src) 99.4 F (37.4 C) (Oral)  Resp 16  Ht 5\' 3"  (1.6 m)  Wt 312 lb (141.522 kg)  BMI 55.28 kg/m2  SpO2 97% Body mass index is 55.28 kg/(m^2). Wt Readings from Last 3 Encounters:  07/08/15 312 lb (141.522 kg)  03/29/15 328 lb (148.78 kg)  03/05/15 321 lb 3.2 oz (145.695 kg)   Constitutional: obese, in NAD Eyes: PERRLA, EOMI, no exophthalmos ENT: moist mucous membranes, no thyromegaly, no cervical lymphadenopathy Cardiovascular: Tachycardia, RR, No MRG Respiratory:  CTA B Gastrointestinal: abdomen soft, NT, ND, BS+ Musculoskeletal: no deformities, strength intact in all 4 Skin: moist, warm, no rashes; + acanthosis nigricans on neck Neurological: no tremor with outstretched hands, DTR normal in all 4  ASSESSMENT: 1. DM2, non-insulin-dependent, uncontrolled, without complications  2. Postablative hypothyroidism - h/o med noncompliance  PLAN:  1. Patient with uncontrolled diabetes, improving. Not checking sugars since 04/2015. CBGs fairly controlled then. Will continue current regimen and check HbA1c >> if higher >> may add Glipizide before large meals.  - I suggested to:  Patient  Instructions  Pease continue Metformin 1000 mg 2x a day. Continue Januvia 100 mg in am.  Please return in 3 months with your sugar log.   Please stop at the lab.  - continue checking sugars at different times of the day - check 2 times a day, rotating checks - she is UTD with yearly eye exams - Will check a hemoglobin A1c today  - Return to clinic in 3 mo with sugar log   2. Postablative hypothyroidism - reinforced compliance with LT4 >> will recheck TFTs today - continue take the thyroid hormone every day, with water, >30 minutes before breakfast, separated by >4 hours from acid reflux medications, calcium, iron, multivitamins. - She is on LT4 300 mcg >> latest TFTs normal in 12/2014 - for now continue LT4 300 mcg daily.   Component     Latest Ref Rng 07/09/2015  TSH     0.35 - 4.50 uIU/mL 3.64  Free T4     0.60 - 1.60 ng/dL 1.10  Hemoglobin A1C     4.6 - 6.5 % 10.6 (H)   TFTs normal, but TSH higher... No change in dose needed but need to take LT4 every day. hHbA1c higher! Will try to add Invokana 100 mg in am. Will advise her to stay hydrated.

## 2015-07-08 NOTE — Patient Instructions (Signed)
Pease continue Metformin 1000 mg 2x a day. Continue Januvia 100 mg in am.  Please stop at the lab.  Please return in 3 months with your sugar log.

## 2015-07-09 ENCOUNTER — Other Ambulatory Visit (INDEPENDENT_AMBULATORY_CARE_PROVIDER_SITE_OTHER): Payer: PRIVATE HEALTH INSURANCE

## 2015-07-09 ENCOUNTER — Other Ambulatory Visit: Payer: Self-pay | Admitting: Family Medicine

## 2015-07-09 DIAGNOSIS — E038 Other specified hypothyroidism: Secondary | ICD-10-CM

## 2015-07-09 DIAGNOSIS — E1165 Type 2 diabetes mellitus with hyperglycemia: Secondary | ICD-10-CM | POA: Diagnosis not present

## 2015-07-09 LAB — HEMOGLOBIN A1C: Hgb A1c MFr Bld: 10.6 % — ABNORMAL HIGH (ref 4.6–6.5)

## 2015-07-09 LAB — T4, FREE: FREE T4: 1.1 ng/dL (ref 0.60–1.60)

## 2015-07-09 LAB — TSH: TSH: 3.64 u[IU]/mL (ref 0.35–4.50)

## 2015-07-09 MED ORDER — CANAGLIFLOZIN 100 MG PO TABS: 100.0000 mg | ORAL_TABLET | Freq: Every day | ORAL | Status: DC

## 2015-07-10 NOTE — Telephone Encounter (Signed)
Medication filled to pharmacy as requested.   

## 2015-10-03 ENCOUNTER — Encounter: Payer: Self-pay | Admitting: Internal Medicine

## 2015-10-03 ENCOUNTER — Other Ambulatory Visit: Payer: Self-pay | Admitting: *Deleted

## 2015-10-03 DIAGNOSIS — N760 Acute vaginitis: Secondary | ICD-10-CM

## 2015-10-03 MED ORDER — FLUCONAZOLE 150 MG PO TABS: 150.0000 mg | ORAL_TABLET | Freq: Once | ORAL | Status: DC

## 2015-10-07 ENCOUNTER — Ambulatory Visit: Payer: PRIVATE HEALTH INSURANCE | Admitting: Internal Medicine

## 2015-10-09 ENCOUNTER — Encounter: Payer: Self-pay | Admitting: Family Medicine

## 2015-10-09 ENCOUNTER — Ambulatory Visit: Payer: PRIVATE HEALTH INSURANCE | Admitting: Family Medicine

## 2015-10-09 ENCOUNTER — Telehealth: Payer: Self-pay | Admitting: Family Medicine

## 2015-10-09 NOTE — Telephone Encounter (Signed)
VM 12/14 8:55am (appt sched 12/14 7.22am) and email from pt to cancel appt stating she is feeling better now and did not have a fever. Charge or no charge?

## 2015-10-14 NOTE — Telephone Encounter (Signed)
(  appt sched 12/14 7.22am) and email from pt to cancel appt stating she is feeling better and did not have a fever. Charge or no charge? DOS 10/09/15

## 2015-10-16 NOTE — Telephone Encounter (Signed)
1st no show I see, will mark as no charge

## 2015-10-16 NOTE — Telephone Encounter (Signed)
If pt has had more than 1 no-show, she needs to be charged.  If this is first no-show, no charge

## 2015-10-16 NOTE — Telephone Encounter (Signed)
VM 12/14 8:55am (appt sched 12/14 7.22am) and email from pt to cancel appt stating she is feeling better now and did not have a fever. Charge or no charge?

## 2015-11-13 ENCOUNTER — Telehealth: Payer: Self-pay | Admitting: Behavioral Health

## 2015-11-13 NOTE — Telephone Encounter (Signed)
Assessed the following care gaps: A1c (> 8%) Microalbumin Pap Flu  Attempted to reach patient to schedule CPE or appointment for labs. Patient unavailable at this time. Left message for a callback.

## 2015-11-19 ENCOUNTER — Encounter: Payer: Self-pay | Admitting: *Deleted

## 2015-11-19 ENCOUNTER — Telehealth: Payer: Self-pay | Admitting: *Deleted

## 2015-11-19 NOTE — Telephone Encounter (Signed)
Pre-Visit Call completed with patient and chart updated.   Pre-Visit Info documented in Specialty Comments under [employer].    

## 2015-11-20 ENCOUNTER — Encounter: Payer: Self-pay | Admitting: Family Medicine

## 2015-11-20 ENCOUNTER — Ambulatory Visit (INDEPENDENT_AMBULATORY_CARE_PROVIDER_SITE_OTHER): Payer: BLUE CROSS/BLUE SHIELD | Admitting: Family Medicine

## 2015-11-20 VITALS — BP 138/82 | HR 111 | Temp 98.7°F | Ht 63.0 in | Wt 307.2 lb

## 2015-11-20 DIAGNOSIS — F341 Dysthymic disorder: Secondary | ICD-10-CM

## 2015-11-20 DIAGNOSIS — E1165 Type 2 diabetes mellitus with hyperglycemia: Secondary | ICD-10-CM

## 2015-11-20 DIAGNOSIS — Z0000 Encounter for general adult medical examination without abnormal findings: Secondary | ICD-10-CM

## 2015-11-20 LAB — CBC WITH DIFFERENTIAL/PLATELET
BASOS ABS: 0.1 10*3/uL (ref 0.0–0.1)
Basophils Relative: 0.4 % (ref 0.0–3.0)
EOS ABS: 0.2 10*3/uL (ref 0.0–0.7)
Eosinophils Relative: 1.4 % (ref 0.0–5.0)
HEMATOCRIT: 39.8 % (ref 36.0–46.0)
HEMOGLOBIN: 12.9 g/dL (ref 12.0–15.0)
LYMPHS PCT: 20.2 % (ref 12.0–46.0)
Lymphs Abs: 2.5 10*3/uL (ref 0.7–4.0)
MCHC: 32.5 g/dL (ref 30.0–36.0)
MCV: 89.6 fl (ref 78.0–100.0)
Monocytes Absolute: 0.6 10*3/uL (ref 0.1–1.0)
Monocytes Relative: 4.5 % (ref 3.0–12.0)
NEUTROS ABS: 9 10*3/uL — AB (ref 1.4–7.7)
Neutrophils Relative %: 73.5 % (ref 43.0–77.0)
Platelets: 442 10*3/uL — ABNORMAL HIGH (ref 150.0–400.0)
RBC: 4.45 Mil/uL (ref 3.87–5.11)
RDW: 13.7 % (ref 11.5–15.5)
WBC: 12.2 10*3/uL — ABNORMAL HIGH (ref 4.0–10.5)

## 2015-11-20 LAB — LIPID PANEL
Cholesterol: 140 mg/dL (ref 0–200)
HDL: 46.5 mg/dL (ref >39.00)
LDL CALC: 71 mg/dL (ref 0–99)
NONHDL: 93.14
Total CHOL/HDL Ratio: 3
Triglycerides: 109 mg/dL (ref 0.0–149.0)
VLDL: 21.8 mg/dL (ref 0.0–40.0)

## 2015-11-20 LAB — BASIC METABOLIC PANEL
BUN: 7 mg/dL (ref 6–23)
CALCIUM: 9.3 mg/dL (ref 8.4–10.5)
CO2: 24 mEq/L (ref 19–32)
CREATININE: 0.56 mg/dL (ref 0.40–1.20)
Chloride: 103 mEq/L (ref 96–112)
GFR: 160.16 mL/min (ref >60.00)
GLUCOSE: 179 mg/dL — AB (ref 70–99)
Potassium: 3.8 mEq/L (ref 3.5–5.1)
Sodium: 136 mEq/L (ref 135–145)

## 2015-11-20 LAB — TSH: TSH: 3.01 u[IU]/mL (ref 0.35–4.50)

## 2015-11-20 LAB — MICROALBUMIN / CREATININE URINE RATIO
Creatinine,U: 204.9 mg/dL
Microalb Creat Ratio: 1 mg/g (ref 0.0–30.0)
Microalb, Ur: 2 mg/dL — ABNORMAL HIGH (ref 0.0–1.9)

## 2015-11-20 LAB — HEPATIC FUNCTION PANEL
ALT: 26 U/L (ref 0–35)
AST: 20 U/L (ref 0–37)
Albumin: 4.2 g/dL (ref 3.5–5.2)
Alkaline Phosphatase: 66 U/L (ref 39–117)
BILIRUBIN DIRECT: 0 mg/dL (ref 0.0–0.3)
TOTAL PROTEIN: 7.6 g/dL (ref 6.0–8.3)
Total Bilirubin: 0.4 mg/dL (ref 0.2–1.2)

## 2015-11-20 LAB — VITAMIN D 25 HYDROXY (VIT D DEFICIENCY, FRACTURES): VITD: 21.39 ng/mL — ABNORMAL LOW (ref 30.00–100.00)

## 2015-11-20 MED ORDER — ALPRAZOLAM 0.5 MG PO TABS: 0.5000 mg | ORAL_TABLET | Freq: Two times a day (BID) | ORAL | Status: DC | PRN

## 2015-11-20 MED ORDER — FLUOXETINE HCL 20 MG PO TABS: 20.0000 mg | ORAL_TABLET | Freq: Every day | ORAL | Status: DC

## 2015-11-20 NOTE — Assessment & Plan Note (Signed)
Pt's PE unchanged from previous.  Obese.  Pt is working on [employer] and regular exercise.  UTD on pap.  Too young for mammo, colonoscopy.  Refuses flu.  Check labs.  Anticipatory guidance provided.

## 2015-11-20 NOTE — Assessment & Plan Note (Signed)
Ongoing issue.  Following w/ Endo.  Will get microalbumin today.

## 2015-11-20 NOTE — Patient Instructions (Signed)
Follow up in 4-6 weeks to recheck mood We'll notify you of your lab results and make any changes if needed Keep up the good work on healthy diet and regular exercise!  You're doing it! Call and schedule your eye exam Restart the Prozac once daily Use the Alprazolam as needed for panicked moments Call with any questions or concerns If you want to join [place] at the new [place] office, any scheduled appointments will automatically transfer and we will see you at [address] [place] [address], [place], [place] [address] ([place]) [employer]!!!

## 2015-11-20 NOTE — Assessment & Plan Note (Signed)
Deteriorated since stopping her prozac.  Restart daily SSRI and add Alprazolam for breakthrough panicked moments.  Reviewed supportive care and red flags that should prompt return.  Pt expressed understanding and is in agreement w/ plan.

## 2015-11-20 NOTE — Progress Notes (Signed)
   Subjective:    Patient ID: Joanne Harris, female    DOB: January 20, 1981, 35 y.o.   MRN: [id number]  HPI CPE- UTD on GYN Julien Girt, done 2016).  Following w/ Dr Cruzita Lederer for diabetes.  Due for eye exam.     Review of Systems Patient reports no vision/ hearing changes, adenopathy,fever, weight change,  persistant/recurrent hoarseness , swallowing issues, chest pain, palpitations, edema, persistant/recurrent cough, hemoptysis, dyspnea (rest/exertional/paroxysmal nocturnal), gastrointestinal bleeding (melena, rectal bleeding), abdominal pain, significant heartburn, bowel changes, GU symptoms (dysuria, hematuria, incontinence), Gyn symptoms (abnormal  bleeding, pain),  syncope, focal weakness, memory loss, numbness & tingling, skin/hair/nail changes, abnormal bruising or bleeding.   Depression/anxiety- pt reports she went to counseling and developed a new hobby w/ some improvement in anxiety sxs and was able to wean off the Prozac.  Feels she may need to revisit this as sxs are returning.  Pt reports she will have episodes of feeling overwhelmed w/ shortness of breath.    Objective:   Physical Exam General Appearance:    Alert, cooperative, no distress, appears stated age, obese  Head:    Normocephalic, without obvious abnormality, atraumatic  Eyes:    PERRL, conjunctiva/corneas clear, EOM's intact, fundi    benign, both eyes  Ears:    Normal TM's and external ear canals, both ears  Nose:   Nares normal, septum midline, mucosa normal, no drainage    or sinus tenderness  Throat:   Lips, mucosa, and tongue normal; teeth and gums normal  Neck:   Supple, symmetrical, trachea midline, no adenopathy;    Thyroid: no enlargement/tenderness/nodules  Back:     Symmetric, no curvature, ROM normal, no CVA tenderness  Lungs:     Clear to auscultation bilaterally, respirations unlabored  Chest Wall:    No tenderness or deformity   Heart:    Regular rate and rhythm, S1 and S2 normal, no murmur, rub   or gallop    Breast Exam:    Deferred to GYN  Abdomen:     Soft, non-tender, bowel sounds active all four quadrants,    no masses, no organomegaly  Genitalia:    Deferred to GYN  Rectal:    Extremities:   Extremities normal, atraumatic, no cyanosis or edema  Pulses:   2+ and symmetric all extremities  Skin:   Skin color, texture, turgor normal, no rashes or lesions  Lymph nodes:   Cervical, supraclavicular, and axillary nodes normal  Neurologic:   CNII-XII intact, normal strength, sensation and reflexes    throughout          Assessment & Plan:

## 2015-11-20 NOTE — Progress Notes (Signed)
Pre visit review using our clinic review tool, if applicable. No additional management support is needed unless otherwise documented below in the visit note. 

## 2015-11-21 MED ORDER — VITAMIN D (ERGOCALCIFEROL) 1.25 MG (50000 UNIT) PO CAPS: 50000.0000 [IU] | ORAL_CAPSULE | ORAL | Status: DC

## 2015-11-21 NOTE — Addendum Note (Signed)
Addended by: Ricky Ala on: 11/21/2015 11:39 AM   Modules accepted: Orders

## 2015-12-02 ENCOUNTER — Other Ambulatory Visit (INDEPENDENT_AMBULATORY_CARE_PROVIDER_SITE_OTHER): Payer: BLUE CROSS/BLUE SHIELD | Admitting: *Deleted

## 2015-12-02 ENCOUNTER — Encounter: Payer: Self-pay | Admitting: Internal Medicine

## 2015-12-02 ENCOUNTER — Ambulatory Visit (INDEPENDENT_AMBULATORY_CARE_PROVIDER_SITE_OTHER): Payer: BLUE CROSS/BLUE SHIELD | Admitting: Internal Medicine

## 2015-12-02 VITALS — BP 118/80 | HR 114 | Temp 98.3°F | Resp 12 | Wt 308.0 lb

## 2015-12-02 DIAGNOSIS — E890 Postprocedural hypothyroidism: Secondary | ICD-10-CM | POA: Diagnosis not present

## 2015-12-02 DIAGNOSIS — E1165 Type 2 diabetes mellitus with hyperglycemia: Secondary | ICD-10-CM | POA: Diagnosis not present

## 2015-12-02 LAB — POCT GLYCOSYLATED HEMOGLOBIN (HGB A1C): Hemoglobin A1C: 8.9

## 2015-12-02 MED ORDER — SITAGLIPTIN PHOSPHATE 100 MG PO TABS: 100.0000 mg | ORAL_TABLET | Freq: Every day | ORAL | Status: DC

## 2015-12-02 MED ORDER — GLIPIZIDE 5 MG PO TABS: 5.0000 mg | ORAL_TABLET | Freq: Two times a day (BID) | ORAL | Status: DC

## 2015-12-02 NOTE — Progress Notes (Signed)
Patient ID: Joanne Harris, female   DOB: 1981/07/20, 35 y.o.   MRN: [id number]  HPI: Joanne Harris is a 35 y.o.-year-old female, returning for f/u for DM2, dx in 2013, non-insulin-dependent, uncontrolled, without complications. Last visit 5 mo ago.  Last hemoglobin A1c was: Lab Results  Component Value Date   HGBA1C 10.6* 07/09/2015   HGBA1C 8.8* 03/29/2015   HGBA1C 7.5* 01/17/2015   Pt is on a regimen of: - Metformin 1000 mg po bid (added 09/2014) - Invokana 100 mg daily (added 06/2015) - had 1 yeast inf She was on Januvia 100 mg daily in am (added 10/2014) >> stopped b/c too expensive 08/2015.  Pt does check  her sugars 1-2x a day: - am: 137-175 >> 116-175 >> n/c >> 117-120 >> 240, 283 - 2h after b'fast: 140 >> n/c>> 120-165, 189 >> n/c >> 155-319 - lunch: 111-136 >> 147 >> 97-119 >> n/c >> 288, 307 - 2h after lunch: 98, 123-159, 170 >> 131-150, 200x1 >> n/c >> 255-319 - dinner: 119-125 >> 132-146, 170 >> n/c - 2h  After dinner: 136, 158 >> 146, 171 >> 96-159, 183 >> 132-160 >> n/c - bedtime: 113, 132 >> 102, 152>> n/c No lows. Lowest 102 >> 155.? if she has hypoglycemia awareness. Highest sugar was 544 >> 176 >> 200x1 >> 400 >> 201 >> 300s  Glucometer: One Touch Verio  Pt's meals are: - Breakfast: protein shake or greek yoghurt w/ fruit; honey nut Cheerios with almond or cashew milk - Lunch: protein + veggie - Dinner: protein + veggie - Snacks: 2 a day: pork rinds, cheese sticks, nuts, peperonis Exercise: dance 2x a week  - no CKD, last BUN/creatinine:  Lab Results  Component Value Date   BUN 7 11/20/2015   CREATININE 0.56 11/20/2015   - last set of lipids: Lab Results  Component Value Date   CHOL 140 11/20/2015   HDL 46.50 11/20/2015   LDLCALC 71 11/20/2015   LDLDIRECT 130.0 10/01/2014   TRIG 109.0 11/20/2015   CHOLHDL 3 11/20/2015   - last eye exam was 09/25/2014. No DR.  - no numbness and tingling in her feet.  Hypothyroidism: Reviewed hx:  Dx with  [facility] in highschool (2001) >> had RAI tx same year. Started LT4 afterwards.  She is on LT4 (Synthroid DAW) 300 mcg daily now - has previous h/o noncompliance. TFTs normal, after she started to take the LT4 consistently.   She is taking it: - 7 am - fasting - w/ water or nothing - b'fast >1 h later - no Ca, iron, MVI, PPI  Reviewed TFTs: Lab Results  Component Value Date   TSH 3.01 11/20/2015   TSH 3.64 07/09/2015   TSH 0.36 01/17/2015   TSH 3.24 11/05/2014   TSH 192.00* 10/01/2014   TSH 6.65* 04/10/2014   TSH 0.74 08/30/2012   TSH 26.08* 05/03/2012   TSH 43.69* 01/02/2011   TSH 0.82 09/02/2010   FREET4 1.10 07/09/2015   FREET4 1.26 01/17/2015   FREET4 1.18 11/05/2014   FREET4 1.1 11/14/2008   ROS: Constitutional: no weight gain/loss, + fatigue, no subjective hyperthermia/hypothermia, + nocturia. Eyes: no blurry vision, no xerophthalmia ENT: no sore throat, no nodules palpated in throat, no dysphagia/odynophagia, no hoarseness Cardiovascular: no CP/SOB/palpitations/leg swelling Respiratory: no cough/SOB Gastrointestinal: no N/V/D/C Musculoskeletal: no muscle/+ joint aches Skin: no rashes Neurological: no tremors/numbness/tingling/dizziness  I reviewed pt's medications, allergies, PMH, social hx, family hx, and changes were documented in the history of present illness. She started Xanax, Prozac.  Otherwise, unchanged from my initial visit note:  Past Medical History  Diagnosis Date  . Hypothyroid   . Obesity   . Depression   . Diabetes mellitus    Past Surgical History  Procedure Laterality Date  . Cholecystectomy     History   Social History  . Marital Status: Single    Spouse Name: N/A    Number of Children: 0   Occupational History  . Insurance agent   Social History Main Topics  . Smoking status: Never Smoker   . Smokeless tobacco: Not on file  . Alcohol Use: Yes     Comment: occ.  . Drug Use: No   Current Outpatient Prescriptions on File Prior  to Visit  Medication Sig Dispense Refill  . ALPRAZolam (XANAX) 0.5 MG tablet Take 1 tablet (0.5 mg total) by mouth 2 (two) times daily as needed for anxiety. 60 tablet 1  . Ascorbic Acid (VITAMIN C) 500 MG CAPS Take 1 capsule by mouth daily. Reported on 11/19/2015    . b complex vitamins tablet Take 1 tablet by mouth daily. Reported on 11/19/2015    . canagliflozin (INVOKANA) 100 MG TABS tablet Take 1 tablet (100 mg total) by mouth daily. 30 tablet 2  . FLUoxetine (PROZAC) 20 MG tablet Take 1 tablet (20 mg total) by mouth daily. 30 tablet 3  . glucose blood (ONETOUCH VERIO) test strip Use to test blood sugar 2 times daily as instructed. 100 each 11  . metFORMIN (GLUCOPHAGE) 1000 MG tablet TAKE 1 TABLET (1,000 MG TOTAL) BY MOUTH 2 (TWO) TIMES DAILY WITH A MEAL. 180 tablet 1  . ONETOUCH DELICA LANCETS FINE MISC Use to test blood sugar 2 times daily as instructed. 100 each 11  . SYNTHROID 300 MCG tablet TAKE 1 TABLET BY MOUTH EVERY DAY 30 tablet 3  . Vitamin D, Ergocalciferol, (DRISDOL) 50000 units CAPS capsule Take 1 capsule (50,000 Units total) by mouth every 7 (seven) days. 12 capsule 0  . Vitamin D, Cholecalciferol, 1000 UNITS CAPS Take 2 capsules by mouth daily. Reported on 12/02/2015     No current facility-administered medications on file prior to visit.  IUD placed 06/2012. No Known Allergies Family History  Problem Relation Age of Onset  . Anemia Mother   . Breast cancer Mother   . Arthritis Mother   . Hypertension      grandmother  . Diabetes      grandmother  . Stroke      grandfather  . Breast cancer      grandmother, late 73s  . Diabetes Maternal Grandmother   . Breast cancer Maternal Grandmother    PE: BP 118/80 mmHg  Pulse 114  Temp(Src) 98.3 F (36.8 C) (Oral)  Resp 12  Wt 308 lb (139.708 kg)  SpO2 97% Body mass index is 54.57 kg/(m^2). Wt Readings from Last 3 Encounters:  12/02/15 308 lb (139.708 kg)  11/20/15 307 lb 3.2 oz (139.345 kg)  07/08/15 312 lb (141.522  kg)   Constitutional: obese, in NAD Eyes: PERRLA, EOMI, no exophthalmos ENT: moist mucous membranes, no thyromegaly, no cervical lymphadenopathy Cardiovascular: Tachycardia, RR, No MRG Respiratory: CTA B Gastrointestinal: abdomen soft, NT, ND, BS+ Musculoskeletal: no deformities, strength intact in all 4 Skin: moist, warm, no rashes; + acanthosis nigricans on neck Neurological: no tremor with outstretched hands, DTR normal in all 4  ASSESSMENT: 1. DM2, non-insulin-dependent, uncontrolled, without complications  2. Postablative hypothyroidism - h/o med noncompliance  PLAN:  1. Patient with uncontrolled diabetes,  improving. We added Invokana at last visit >> sugars are better, but still high. Will continue current regimen but try to add Januvia (given coupon card) and Glipizide. - I suggested to:  Patient Instructions  Please continue: - Metformin 1000 mg 2x a day - Invokana 100 mg daily in am  Restart: - Januvia 100 mg in am  Start: - Glipizide 5 mg before breakfast and 5 mg before dinner  Please return in 3 months with your sugar log.   - continue checking sugars at different times of the day - check 2 times a day, rotating checks - she needs a new yearly eye exam >> advised to schedule - Will check a hemoglobin A1c today >> 8.9% (better) - Return to clinic in 3 mo with sugar log   2. Postablative hypothyroidism - recent TSH normal (this mo) - continue take the thyroid hormone every day, with water, >30 minutes before breakfast, separated by >4 hours from acid reflux medications, calcium, iron, multivitamins. - for now continue LT4 300 mcg daily.

## 2015-12-02 NOTE — Patient Instructions (Addendum)
Please continue: - Metformin 1000 mg 2x a day - Invokana 100 mg daily in am  Restart: - Januvia 100 mg in am  Start: - Glipizide 5 mg before breakfast and 5 mg before dinner  Please return in 3 months with your sugar log.

## 2015-12-18 ENCOUNTER — Ambulatory Visit (INDEPENDENT_AMBULATORY_CARE_PROVIDER_SITE_OTHER): Payer: BLUE CROSS/BLUE SHIELD | Admitting: Family Medicine

## 2015-12-18 ENCOUNTER — Encounter: Payer: Self-pay | Admitting: Family Medicine

## 2015-12-18 VITALS — BP 120/80 | HR 110 | Temp 98.8°F | Ht 63.0 in | Wt 305.0 lb

## 2015-12-18 DIAGNOSIS — R030 Elevated blood-pressure reading, without diagnosis of hypertension: Secondary | ICD-10-CM

## 2015-12-18 DIAGNOSIS — IMO0001 Reserved for inherently not codable concepts without codable children: Secondary | ICD-10-CM

## 2015-12-18 DIAGNOSIS — F341 Dysthymic disorder: Secondary | ICD-10-CM | POA: Diagnosis not present

## 2015-12-18 MED ORDER — FLUOXETINE HCL 20 MG PO CAPS: 20.0000 mg | ORAL_CAPSULE | Freq: Every day | ORAL | Status: DC

## 2015-12-18 NOTE — Assessment & Plan Note (Signed)
Improved since restarting Prozac.  Pt reports that the tablets are costing $55/month where her coworker gets capsules for $2.  Prescription changed.  Will continue to follow.

## 2015-12-18 NOTE — Assessment & Plan Note (Signed)
Pt's BP was initially elevated but this came down to normal range by end of visit.  No changes at this time.  Encouraged healthy diet and regular exercise.  Will follow.

## 2015-12-18 NOTE — Progress Notes (Signed)
   Subjective:    Patient ID: Joanne Harris, female    DOB: October 20, 1981, 35 y.o.   MRN: [id number]  HPI Depression/anxiety- pt restarted Prozac at last visit.  Pt feels mood and anxiety has improved.  Pt says she 'wakes up ready to go as opposed to just lying there'.  No longer feeling sad and mopey.  Elevated BP- pt's BP at Dr Arman Filter office was 118/80.  Pt reports being anxious this AM to be here but otherwise feeling well.  No CP, SOB, HAs, visual changes, palpitations.   Review of Systems For ROS see HPI     Objective:   Physical Exam  Constitutional: She is oriented to person, place, and time. She appears well-developed and well-nourished. No distress.  obese  HENT:  Head: Normocephalic and atraumatic.  Eyes: Conjunctivae and EOM are normal. Pupils are equal, round, and reactive to light.  Neck: Normal range of motion. Neck supple. No thyromegaly present.  Cardiovascular: Normal rate, regular rhythm, normal heart sounds and intact distal pulses.   No murmur heard. Pulmonary/Chest: Effort normal and breath sounds normal. No respiratory distress.  Musculoskeletal: She exhibits no edema.  Lymphadenopathy:    She has no cervical adenopathy.  Neurological: She is alert and oriented to person, place, and time.  Skin: Skin is warm and dry.  Psychiatric: She has a normal mood and affect. Her behavior is normal.  Vitals reviewed.         Assessment & Plan:

## 2015-12-18 NOTE — Patient Instructions (Signed)
Follow up in July to recheck BP, cholesterol, and weight loss progress Continue the Prozac daily- new prescription sent Continue to work on healthy diet and regular exercise- you are down 3 lbs in 2 weeks!!  That's fantastic! Call with any questions or concerns If you want to join [place] at the new [place] office, any scheduled appointments will automatically transfer and we will see you at [address] [place] [address] [place], [place] [address] ([place]) [place]!!!

## 2015-12-18 NOTE — Progress Notes (Signed)
Pre visit review using our clinic review tool, if applicable. No additional management support is needed unless otherwise documented below in the visit note. 

## 2015-12-25 ENCOUNTER — Other Ambulatory Visit: Payer: Self-pay | Admitting: *Deleted

## 2015-12-25 ENCOUNTER — Other Ambulatory Visit: Payer: Self-pay | Admitting: Family Medicine

## 2015-12-25 ENCOUNTER — Encounter: Payer: Self-pay | Admitting: Internal Medicine

## 2015-12-25 MED ORDER — SYNTHROID 300 MCG PO TABS: 300.0000 ug | ORAL_TABLET | Freq: Every day | ORAL | Status: DC

## 2015-12-25 NOTE — Telephone Encounter (Signed)
Medication filled to pharmacy as requested.   

## 2016-01-30 ENCOUNTER — Encounter: Payer: Self-pay | Admitting: *Deleted

## 2016-01-30 LAB — HM DIABETES EYE EXAM

## 2016-02-05 ENCOUNTER — Encounter: Payer: Self-pay | Admitting: General Practice

## 2016-02-27 ENCOUNTER — Ambulatory Visit: Payer: BLUE CROSS/BLUE SHIELD | Admitting: Family Medicine

## 2016-03-03 ENCOUNTER — Other Ambulatory Visit: Payer: Self-pay | Admitting: Internal Medicine

## 2016-03-03 ENCOUNTER — Other Ambulatory Visit: Payer: Self-pay | Admitting: Family Medicine

## 2016-03-03 NOTE — Telephone Encounter (Signed)
Last OV: 12/18/15 Last filled: 11/20/15, #60, 0 RF Sig:  take 1 tablet by mouth twice a day if needed for anxiety UDS: Not on file

## 2016-03-04 ENCOUNTER — Ambulatory Visit: Payer: BLUE CROSS/BLUE SHIELD | Admitting: Internal Medicine

## 2016-03-26 ENCOUNTER — Ambulatory Visit: Payer: Self-pay | Admitting: Family Medicine

## 2016-04-21 ENCOUNTER — Ambulatory Visit: Payer: BLUE CROSS/BLUE SHIELD | Admitting: Internal Medicine

## 2016-04-21 DIAGNOSIS — Z0289 Encounter for other administrative examinations: Secondary | ICD-10-CM

## 2016-05-05 ENCOUNTER — Ambulatory Visit: Payer: BLUE CROSS/BLUE SHIELD | Admitting: Family Medicine

## 2016-05-08 ENCOUNTER — Other Ambulatory Visit: Payer: Self-pay | Admitting: Family Medicine

## 2016-05-08 NOTE — Telephone Encounter (Signed)
Medication filled to pharmacy as requested.   

## 2016-05-08 NOTE — Telephone Encounter (Signed)
Last OV 12/18/15 Alprazolam last filled 03/04/16 #60 with 1

## 2016-05-22 LAB — HM PAP SMEAR: HM PAP: NEGATIVE

## 2016-05-28 ENCOUNTER — Ambulatory Visit: Payer: BLUE CROSS/BLUE SHIELD | Admitting: Family Medicine

## 2016-06-20 ENCOUNTER — Other Ambulatory Visit: Payer: Self-pay | Admitting: Internal Medicine

## 2016-06-25 ENCOUNTER — Ambulatory Visit: Payer: BLUE CROSS/BLUE SHIELD | Admitting: Family Medicine

## 2016-07-09 ENCOUNTER — Ambulatory Visit: Payer: BLUE CROSS/BLUE SHIELD | Admitting: Family Medicine

## 2016-07-15 ENCOUNTER — Other Ambulatory Visit: Payer: Self-pay | Admitting: Family Medicine

## 2016-07-16 NOTE — Telephone Encounter (Signed)
Medication filled to pharmacy as requested.   

## 2016-07-16 NOTE — Telephone Encounter (Signed)
Med filled and pt made aware available at front desk for pick up.   

## 2016-07-16 NOTE — Telephone Encounter (Signed)
Last OV 12/18/15 Alprazolam last filled 05/08/16 #60 with 1

## 2016-09-23 ENCOUNTER — Encounter: Payer: Self-pay | Admitting: Internal Medicine

## 2016-09-24 ENCOUNTER — Other Ambulatory Visit: Payer: Self-pay

## 2016-09-24 MED ORDER — FLUCONAZOLE 150 MG PO TABS: 150.0000 mg | ORAL_TABLET | Freq: Once | ORAL | 1 refills | Status: AC

## 2016-10-12 ENCOUNTER — Other Ambulatory Visit: Payer: Self-pay | Admitting: Family Medicine

## 2016-10-12 ENCOUNTER — Other Ambulatory Visit: Payer: Self-pay | Admitting: Internal Medicine

## 2016-10-12 NOTE — Telephone Encounter (Signed)
Last OV 12/18/15 Alprazolam last filled 07/16/16 #60 with 1

## 2016-10-13 NOTE — Telephone Encounter (Signed)
Medication filled to pharmacy as requested.   

## 2016-10-27 ENCOUNTER — Other Ambulatory Visit: Payer: Self-pay | Admitting: Internal Medicine

## 2016-11-30 ENCOUNTER — Ambulatory Visit: Payer: BLUE CROSS/BLUE SHIELD | Admitting: Internal Medicine

## 2016-12-06 ENCOUNTER — Other Ambulatory Visit: Payer: Self-pay | Admitting: Internal Medicine

## 2016-12-09 ENCOUNTER — Encounter: Payer: Self-pay | Admitting: Internal Medicine

## 2016-12-09 ENCOUNTER — Other Ambulatory Visit: Payer: Self-pay | Admitting: Internal Medicine

## 2016-12-09 MED ORDER — FLUCONAZOLE 150 MG PO TABS: 150.0000 mg | ORAL_TABLET | Freq: Once | ORAL | 1 refills | Status: AC

## 2017-01-21 ENCOUNTER — Ambulatory Visit: Payer: BLUE CROSS/BLUE SHIELD | Admitting: Internal Medicine

## 2017-02-08 ENCOUNTER — Encounter: Payer: Self-pay | Admitting: General Practice

## 2017-02-08 ENCOUNTER — Other Ambulatory Visit: Payer: Self-pay | Admitting: Internal Medicine

## 2017-02-08 ENCOUNTER — Other Ambulatory Visit: Payer: Self-pay | Admitting: Family Medicine

## 2017-02-08 NOTE — Telephone Encounter (Signed)
[place] [facility] for #30, no refills.  Needs to schedule f/u somewhere for diabetes or no additional refills- she has not been seen in over a year at endo!

## 2017-02-08 NOTE — Telephone Encounter (Signed)
mychart message and letter mailed to pt to inform of need for diabetes follow up.

## 2017-02-08 NOTE — Telephone Encounter (Signed)
Last OV 12/18/15 (BP and depression), CPE 05/06/17 Alprazolam last filled 10/13/16 #60 with 1

## 2017-02-18 ENCOUNTER — Ambulatory Visit (INDEPENDENT_AMBULATORY_CARE_PROVIDER_SITE_OTHER): Payer: Managed Care, Other (non HMO) | Admitting: Family Medicine

## 2017-02-18 ENCOUNTER — Encounter: Payer: Self-pay | Admitting: Family Medicine

## 2017-02-18 VITALS — BP 134/86 | HR 93 | Temp 98.7°F | Resp 17 | Ht 63.0 in | Wt 316.5 lb

## 2017-02-18 DIAGNOSIS — F341 Dysthymic disorder: Secondary | ICD-10-CM | POA: Diagnosis not present

## 2017-02-18 DIAGNOSIS — E1165 Type 2 diabetes mellitus with hyperglycemia: Secondary | ICD-10-CM

## 2017-02-18 DIAGNOSIS — E038 Other specified hypothyroidism: Secondary | ICD-10-CM | POA: Diagnosis not present

## 2017-02-18 MED ORDER — FLUCONAZOLE 150 MG PO TABS: 150.0000 mg | ORAL_TABLET | Freq: Once | ORAL | 0 refills | Status: AC

## 2017-02-18 NOTE — Progress Notes (Signed)
   Subjective:    Patient ID: Joanne Harris, female    DOB: Mar 18, 1981, 36 y.o.   MRN: [id number]  HPI DM- chronic problem.  Pt was seeing Dr Cruzita Lederer but has not been seen in over a year.  Due for eye exam (pt needs to schedule), foot exam, microalbumin.  Pt is not taking Invokana or Januvia.  On Metformin and Glipizide.  Denies symptomatic lows.  No CP, SOB, HAs, visual changes, abd pain, N/V, numbness/tingling of hands/feet.  No sores on feet.  Hypothyroid- chronic problem, on 300 mcg of Synthroid.  Has not had recent TSH.  Obesity- pt's BMI is 56.  Pt has gained 12 lbs since last visit.  Not exercising regularly, not following a particular diet.  Depression- chronic problem.  Pt got a new job which improved her stress level.  Has been able to decrease her xanax use.  Prozac is working well.     Review of Systems For ROS see HPI     Objective:   Physical Exam  Constitutional: She is oriented to person, place, and time. She appears well-developed and well-nourished. No distress.  Morbidly obese  HENT:  Head: Normocephalic and atraumatic.  Eyes: Conjunctivae and EOM are normal. Pupils are equal, round, and reactive to light.  Neck: Normal range of motion. Neck supple. No thyromegaly present.  Cardiovascular: Normal rate, regular rhythm, normal heart sounds and intact distal pulses.   No murmur heard. Pulmonary/Chest: Effort normal and breath sounds normal. No respiratory distress.  Abdominal: Soft. She exhibits no distension. There is no tenderness.  Musculoskeletal: She exhibits no edema.  Lymphadenopathy:    She has no cervical adenopathy.  Neurological: She is alert and oriented to person, place, and time.  Skin: Skin is warm and dry.  Psychiatric: She has a normal mood and affect. Her behavior is normal.  Vitals reviewed.         Assessment & Plan:

## 2017-02-18 NOTE — Progress Notes (Signed)
Pre visit review using our clinic review tool, if applicable. No additional management support is needed unless otherwise documented below in the visit note. 

## 2017-02-18 NOTE — Assessment & Plan Note (Signed)
Deteriorated.  Pt has gained 12 lbs since last visit.  Not following a particular diet and not getting regular exercise.  Stressed need for both.  Check labs to risk stratify.  Will follow.

## 2017-02-18 NOTE — Assessment & Plan Note (Signed)
Chronic problem.  Pt is on 312mcg of Synthroid.  Has not had TSH in >1 yr.  Check labs and adjust meds/refer back to Endo if needed.

## 2017-02-18 NOTE — Assessment & Plan Note (Signed)
Chronic problem.  Pt has not had an A1C in >1 yr.  Is not taking Invokana or Januvia.  Due for eye exam.  Foot exam done today.  Due for microalbumin.  Stressed need for healthy diet and regular exercise as well as medication and appt compliance.  Discussed that pt's recurrent yeast infxns is most likely due to high sugars.  Diflucan given.  Check labs and adjust meds/refer back to Endo prn.  Pt expressed understanding and is in agreement w/ plan.

## 2017-02-18 NOTE — Patient Instructions (Signed)
Schedule your complete physical in 6 months We'll notify you of your lab results and make any changes if needed Continue to work on healthy diet and regular exercise- this is VERY important! Please call and schedule your eye exam Call with any questions or concerns [place]!!!

## 2017-02-18 NOTE — Assessment & Plan Note (Signed)
Chronic problem.  Doing better since switching jobs.  Rarely requiring alprazolam.  Prozac is controlling her symptoms.

## 2017-02-19 ENCOUNTER — Other Ambulatory Visit (INDEPENDENT_AMBULATORY_CARE_PROVIDER_SITE_OTHER): Payer: Managed Care, Other (non HMO)

## 2017-02-19 ENCOUNTER — Other Ambulatory Visit: Payer: Managed Care, Other (non HMO)

## 2017-02-19 DIAGNOSIS — R7309 Other abnormal glucose: Secondary | ICD-10-CM

## 2017-02-19 LAB — LIPID PANEL
CHOL/HDL RATIO: 3
Cholesterol: 153 mg/dL (ref 0–200)
HDL: 48.5 mg/dL (ref >39.00)
LDL Cholesterol: 80 mg/dL (ref 0–99)
NONHDL: 104.17
Triglycerides: 121 mg/dL (ref 0.0–149.0)
VLDL: 24.2 mg/dL (ref 0.0–40.0)

## 2017-02-19 LAB — CBC WITH DIFFERENTIAL/PLATELET
BASOS PCT: 0.7 % (ref 0.0–3.0)
Basophils Absolute: 0.1 10*3/uL (ref 0.0–0.1)
EOS ABS: 0.2 10*3/uL (ref 0.0–0.7)
Eosinophils Relative: 1.5 % (ref 0.0–5.0)
HEMATOCRIT: 38.5 % (ref 36.0–46.0)
Hemoglobin: 12.7 g/dL (ref 12.0–15.0)
LYMPHS PCT: 25 % (ref 12.0–46.0)
Lymphs Abs: 2.6 10*3/uL (ref 0.7–4.0)
MCHC: 33 g/dL (ref 30.0–36.0)
MCV: 90.5 fl (ref 78.0–100.0)
Monocytes Absolute: 0.5 10*3/uL (ref 0.1–1.0)
Monocytes Relative: 4.6 % (ref 3.0–12.0)
Neutro Abs: 7.2 10*3/uL (ref 1.4–7.7)
Neutrophils Relative %: 68.2 % (ref 43.0–77.0)
Platelets: 450 10*3/uL — ABNORMAL HIGH (ref 150.0–400.0)
RBC: 4.25 Mil/uL (ref 3.87–5.11)
RDW: 15.5 % (ref 11.5–15.5)
WBC: 10.5 10*3/uL (ref 4.0–10.5)

## 2017-02-19 LAB — HEPATIC FUNCTION PANEL
ALT: 32 U/L (ref 0–35)
AST: 24 U/L (ref 0–37)
Albumin: 4 g/dL (ref 3.5–5.2)
Alkaline Phosphatase: 63 U/L (ref 39–117)
BILIRUBIN DIRECT: 0.1 mg/dL (ref 0.0–0.3)
BILIRUBIN TOTAL: 0.4 mg/dL (ref 0.2–1.2)
Total Protein: 7.3 g/dL (ref 6.0–8.3)

## 2017-02-19 LAB — BASIC METABOLIC PANEL
BUN: 15 mg/dL (ref 6–23)
CO2: 24 mEq/L (ref 19–32)
Calcium: 9.5 mg/dL (ref 8.4–10.5)
Chloride: 99 mEq/L (ref 96–112)
Creatinine, Ser: 0.76 mg/dL (ref 0.40–1.20)
GFR: 111.75 mL/min (ref >60.00)
Glucose, Bld: 280 mg/dL — ABNORMAL HIGH (ref 70–99)
Potassium: 3.7 mEq/L (ref 3.5–5.1)
Sodium: 132 mEq/L — ABNORMAL LOW (ref 135–145)

## 2017-02-19 LAB — MICROALBUMIN / CREATININE URINE RATIO
CREATININE, U: 254.7 mg/dL
MICROALB UR: 2.8 mg/dL — AB (ref 0.0–1.9)
Microalb Creat Ratio: 1.1 mg/g (ref 0.0–30.0)

## 2017-02-19 LAB — TSH: TSH: 24.11 u[IU]/mL — ABNORMAL HIGH (ref 0.35–4.50)

## 2017-02-20 LAB — HEMOGLOBIN A1C
HEMOGLOBIN A1C: 9.7 % — AB (ref <5.7)
MEAN PLASMA GLUCOSE: 232 mg/dL

## 2017-02-22 ENCOUNTER — Other Ambulatory Visit: Payer: Self-pay | Admitting: General Practice

## 2017-02-22 MED ORDER — SITAGLIPTIN PHOSPHATE 100 MG PO TABS: 100.0000 mg | ORAL_TABLET | Freq: Every day | ORAL | 2 refills | Status: DC

## 2017-03-17 ENCOUNTER — Other Ambulatory Visit: Payer: Self-pay | Admitting: Family Medicine

## 2017-03-17 NOTE — Telephone Encounter (Signed)
Last OV 02/18/17 Alprazolam last filled 02/08/17 #60 with 0

## 2017-03-17 NOTE — Telephone Encounter (Signed)
Medication filled to pharmacy as requested.   

## 2017-04-01 ENCOUNTER — Ambulatory Visit: Payer: BLUE CROSS/BLUE SHIELD | Admitting: Internal Medicine

## 2017-04-11 ENCOUNTER — Other Ambulatory Visit: Payer: Self-pay | Admitting: Internal Medicine

## 2017-04-26 ENCOUNTER — Other Ambulatory Visit: Payer: Self-pay | Admitting: Family Medicine

## 2017-04-26 NOTE — Telephone Encounter (Signed)
Last OV 02/18/17 Alprazolam last filled 03/17/17 #60 with 0 prozac last filled 12/18/15 #90 with 3  NO CSC or UDS

## 2017-04-27 ENCOUNTER — Encounter: Payer: Self-pay | Admitting: General Practice

## 2017-04-27 NOTE — Telephone Encounter (Signed)
Called pt and left a detailed message to inform that Rx is at the front desk for pick up as she needs to complete a CSC.

## 2017-04-27 NOTE — Telephone Encounter (Signed)
Will allow one fill in PCP absence. She will need to pick up and sign CSC in offer to be filled as this is a chronic medication.

## 2017-05-04 ENCOUNTER — Other Ambulatory Visit: Payer: Self-pay | Admitting: Family Medicine

## 2017-05-04 NOTE — Telephone Encounter (Signed)
Called pt and left a detailed message that she needs to come to the office to complete the controlled substance contract. The fax that we had received back was incomplete and could not be read. Pt prescription is at the front desk for pick up.

## 2017-05-04 NOTE — Telephone Encounter (Signed)
Last OV 02/18/17 Alprazolam last filled 04/27/17 #60 with 0

## 2017-05-05 ENCOUNTER — Other Ambulatory Visit: Payer: Self-pay | Admitting: Family Medicine

## 2017-05-05 NOTE — Telephone Encounter (Signed)
Medication denied again today, pt was informed on voicemail (al numbers listed) that she needs to come to the office to complete CSC. At that time prescription will be given to pt (it is at our front desk for pick up)

## 2017-05-06 ENCOUNTER — Encounter: Payer: BLUE CROSS/BLUE SHIELD | Admitting: Family Medicine

## 2017-06-21 ENCOUNTER — Other Ambulatory Visit: Payer: Self-pay | Admitting: Internal Medicine

## 2017-07-06 ENCOUNTER — Encounter: Payer: Self-pay | Admitting: Internal Medicine

## 2017-07-06 ENCOUNTER — Ambulatory Visit: Payer: Managed Care, Other (non HMO) | Admitting: Internal Medicine

## 2017-07-06 ENCOUNTER — Telehealth: Payer: Self-pay | Admitting: Internal Medicine

## 2017-07-06 DIAGNOSIS — Z0289 Encounter for other administrative examinations: Secondary | ICD-10-CM

## 2017-07-12 ENCOUNTER — Encounter: Payer: Managed Care, Other (non HMO) | Admitting: Family Medicine

## 2017-07-12 NOTE — Telephone Encounter (Signed)
Patient dismissed from [facility] by Layla Maw MD , effective July 06, 2017. Dismissal letter sent out by certified / registered mail.  daj

## 2017-07-20 NOTE — Telephone Encounter (Signed)
Received signed domestic return receipt verifying delivery of certified letter on July 15, 2017. Article number [id number] [phone]

## 2017-12-01 ENCOUNTER — Telehealth: Payer: Self-pay

## 2017-12-01 NOTE — Telephone Encounter (Signed)
Ok to transfer. 

## 2017-12-01 NOTE — Telephone Encounter (Signed)
Please advise 

## 2017-12-01 NOTE — Telephone Encounter (Signed)
ok 

## 2017-12-01 NOTE — Telephone Encounter (Signed)
Copied from [place] #[id number]. Topic: Inquiry >> Dec 01, 2017 10:01 AM Corie Chiquito, NT wrote: Reason for CRM: Patient calling because she would like to be come a patient of Dr.Lowne's. Former [facility]  patient. If someone could give her a call back about this at [phone]

## 2017-12-01 NOTE — Telephone Encounter (Signed)
Please call Pt to schedule NP appt at her convenience. Thank you.

## 2017-12-01 NOTE — Telephone Encounter (Signed)
Called pt and scheduled visit for 12/03/17. Pt said she needed to be seen soon b/c her voice has been out for about a month and she needs to get refill on medications.

## 2017-12-03 ENCOUNTER — Encounter: Payer: Self-pay | Admitting: Family Medicine

## 2017-12-03 ENCOUNTER — Other Ambulatory Visit: Payer: Managed Care, Other (non HMO)

## 2017-12-03 ENCOUNTER — Ambulatory Visit (INDEPENDENT_AMBULATORY_CARE_PROVIDER_SITE_OTHER): Payer: Managed Care, Other (non HMO) | Admitting: Family Medicine

## 2017-12-03 VITALS — BP 142/76 | HR 106 | Temp 99.0°F | Resp 18 | Ht 62.5 in | Wt 316.6 lb

## 2017-12-03 DIAGNOSIS — E039 Hypothyroidism, unspecified: Secondary | ICD-10-CM | POA: Diagnosis not present

## 2017-12-03 DIAGNOSIS — E785 Hyperlipidemia, unspecified: Secondary | ICD-10-CM

## 2017-12-03 DIAGNOSIS — E119 Type 2 diabetes mellitus without complications: Secondary | ICD-10-CM | POA: Insufficient documentation

## 2017-12-03 DIAGNOSIS — F419 Anxiety disorder, unspecified: Secondary | ICD-10-CM

## 2017-12-03 DIAGNOSIS — I10 Essential (primary) hypertension: Secondary | ICD-10-CM

## 2017-12-03 DIAGNOSIS — E538 Deficiency of other specified B group vitamins: Secondary | ICD-10-CM | POA: Diagnosis not present

## 2017-12-03 DIAGNOSIS — Z79899 Other long term (current) drug therapy: Secondary | ICD-10-CM

## 2017-12-03 DIAGNOSIS — IMO0002 Reserved for concepts with insufficient information to code with codable children: Secondary | ICD-10-CM

## 2017-12-03 DIAGNOSIS — E559 Vitamin D deficiency, unspecified: Secondary | ICD-10-CM | POA: Diagnosis not present

## 2017-12-03 DIAGNOSIS — J302 Other seasonal allergic rhinitis: Secondary | ICD-10-CM | POA: Diagnosis not present

## 2017-12-03 DIAGNOSIS — E1165 Type 2 diabetes mellitus with hyperglycemia: Secondary | ICD-10-CM | POA: Diagnosis not present

## 2017-12-03 DIAGNOSIS — E1151 Type 2 diabetes mellitus with diabetic peripheral angiopathy without gangrene: Secondary | ICD-10-CM | POA: Diagnosis not present

## 2017-12-03 LAB — POC URINALSYSI DIPSTICK (AUTOMATED)
BILIRUBIN UA: NEGATIVE
Blood, UA: NEGATIVE
Glucose, UA: NEGATIVE
KETONES UA: NEGATIVE
Leukocytes, UA: NEGATIVE
Nitrite, UA: NEGATIVE
Protein, UA: NEGATIVE
Spec Grav, UA: 1.015 (ref 1.010–1.025)
Urobilinogen, UA: 0.2 E.U./dL
pH, UA: 6 (ref 5.0–8.0)

## 2017-12-03 LAB — VITAMIN D 25 HYDROXY (VIT D DEFICIENCY, FRACTURES): VITD: 19.45 ng/mL — AB (ref 30.00–100.00)

## 2017-12-03 LAB — LIPID PANEL
Cholesterol: 223 mg/dL — ABNORMAL HIGH (ref 0–200)
HDL: 65 mg/dL (ref >39.00)
NONHDL: 157.99
TRIGLYCERIDES: 223 mg/dL — AB (ref 0.0–149.0)
Total CHOL/HDL Ratio: 3
VLDL: 44.6 mg/dL — ABNORMAL HIGH (ref 0.0–40.0)

## 2017-12-03 LAB — COMPREHENSIVE METABOLIC PANEL
ALT: 33 U/L (ref 0–35)
AST: 39 U/L — ABNORMAL HIGH (ref 0–37)
Albumin: 4.8 g/dL (ref 3.5–5.2)
Alkaline Phosphatase: 50 U/L (ref 39–117)
BUN: 10 mg/dL (ref 6–23)
CO2: 27 meq/L (ref 19–32)
Calcium: 9.4 mg/dL (ref 8.4–10.5)
Chloride: 98 mEq/L (ref 96–112)
Creatinine, Ser: 0.89 mg/dL (ref 0.40–1.20)
GFR: 92.71 mL/min (ref >60.00)
Glucose, Bld: 115 mg/dL — ABNORMAL HIGH (ref 70–99)
POTASSIUM: 3.6 meq/L (ref 3.5–5.1)
Sodium: 136 mEq/L (ref 135–145)
Total Bilirubin: 0.5 mg/dL (ref 0.2–1.2)
Total Protein: 8.4 g/dL — ABNORMAL HIGH (ref 6.0–8.3)

## 2017-12-03 LAB — TSH: TSH: 144.71 u[IU]/mL — ABNORMAL HIGH (ref 0.35–4.50)

## 2017-12-03 LAB — LDL CHOLESTEROL, DIRECT: Direct LDL: 125 mg/dL

## 2017-12-03 LAB — VITAMIN B12: VITAMIN B 12: 395 pg/mL (ref 211–911)

## 2017-12-03 MED ORDER — FLUOXETINE HCL 20 MG PO CAPS: 20.0000 mg | ORAL_CAPSULE | Freq: Every day | ORAL | 2 refills | Status: DC

## 2017-12-03 MED ORDER — LOSARTAN POTASSIUM 25 MG PO TABS: 25.0000 mg | ORAL_TABLET | Freq: Every day | ORAL | 2 refills | Status: DC

## 2017-12-03 MED ORDER — ALPRAZOLAM 0.5 MG PO TABS: ORAL_TABLET | ORAL | 0 refills | Status: DC

## 2017-12-03 MED ORDER — METFORMIN HCL 1000 MG PO TABS: 1000.0000 mg | ORAL_TABLET | Freq: Two times a day (BID) | ORAL | 2 refills | Status: DC

## 2017-12-03 MED ORDER — MONTELUKAST SODIUM 10 MG PO TABS: 10.0000 mg | ORAL_TABLET | Freq: Every day | ORAL | 3 refills | Status: DC

## 2017-12-03 MED ORDER — LEVOTHYROXINE SODIUM 300 MCG PO TABS: 300.0000 ug | ORAL_TABLET | Freq: Every day | ORAL | 0 refills | Status: DC

## 2017-12-03 NOTE — Assessment & Plan Note (Signed)
Refer to endo 

## 2017-12-03 NOTE — Assessment & Plan Note (Signed)
Check labs today Refer to endo

## 2017-12-03 NOTE — Assessment & Plan Note (Signed)
Info given for healthy weight and wellness

## 2017-12-03 NOTE — Assessment & Plan Note (Signed)
Endo referral Restart metformin Check labs hgba1c to be checked, minimize simple carbs. Increase exercise as tolerated. Continue current meds

## 2017-12-03 NOTE — Patient Instructions (Addendum)
DASH Eating Plan DASH stands for "Dietary Approaches to Stop Hypertension." The DASH eating plan is a healthy eating plan that has been shown to reduce high blood pressure (hypertension). It may also reduce your risk for type 2 diabetes, heart disease, and stroke. The DASH eating plan may also help with weight loss. What are tips for following this plan? General guidelines  Avoid eating more than 2,300 mg (milligrams) of salt (sodium) a day. If you have hypertension, you may need to reduce your sodium intake to 1,500 mg a day.  Limit alcohol intake to no more than 1 drink a day for nonpregnant women and 2 drinks a day for men. One drink equals 12 oz of beer, 5 oz of wine, or 1 oz of hard liquor.  Work with your health care provider to maintain a healthy body weight or to lose weight. Ask what an ideal weight is for you.  Get at least 30 minutes of exercise that causes your heart to beat faster (aerobic exercise) most days of the week. Activities may include walking, swimming, or biking.  Work with your health care provider or diet and nutrition specialist (dietitian) to adjust your eating plan to your individual calorie needs. Reading food labels  Check food labels for the amount of sodium per serving. Choose foods with less than 5 percent of the Daily Value of sodium. Generally, foods with less than 300 mg of sodium per serving fit into this eating plan.  To find whole grains, look for the word "whole" as the first word in the ingredient list. Shopping  Buy products labeled as "low-sodium" or "no salt added."  Buy fresh foods. Avoid canned foods and premade or frozen meals. Cooking  Avoid adding salt when cooking. Use salt-free seasonings or herbs instead of table salt or sea salt. Check with your health care provider or pharmacist before using salt substitutes.  Do not fry foods. Cook foods using healthy methods such as baking, boiling, grilling, and broiling instead.  Cook with  heart-healthy oils, such as olive, canola, soybean, or sunflower oil. Meal planning   Eat a balanced diet that includes: ? 5 or more servings of fruits and vegetables each day. At each meal, try to fill half of your plate with fruits and vegetables. ? Up to 6-8 servings of whole grains each day. ? Less than 6 oz of lean meat, poultry, or fish each day. A 3-oz serving of meat is about the same size as a deck of cards. One egg equals 1 oz. ? 2 servings of low-fat dairy each day. ? A serving of nuts, seeds, or beans 5 times each week. ? Heart-healthy fats. Healthy fats called Omega-3 fatty acids are found in foods such as flaxseeds and coldwater fish, like sardines, salmon, and mackerel.  Limit how much you eat of the following: ? Canned or prepackaged foods. ? Food that is high in trans fat, such as fried foods. ? Food that is high in saturated fat, such as fatty meat. ? Sweets, desserts, sugary drinks, and other foods with added sugar. ? Full-fat dairy products.  Do not salt foods before eating.  Try to eat at least 2 vegetarian meals each week.  Eat more home-cooked food and less restaurant, buffet, and fast food.  When eating at a restaurant, ask that your food be prepared with less salt or no salt, if possible. What foods are recommended? The items listed may not be a complete list. Talk with your dietitian about what   dietary choices are best for you. Grains Whole-grain or whole-wheat bread. Whole-grain or whole-wheat pasta. Brown rice. Oatmeal. Quinoa. Bulgur. Whole-grain and low-sodium cereals. Pita bread. Low-fat, low-sodium crackers. Whole-wheat flour tortillas. Vegetables Fresh or frozen vegetables (raw, steamed, roasted, or grilled). Low-sodium or reduced-sodium tomato and vegetable juice. Low-sodium or reduced-sodium tomato sauce and tomato paste. Low-sodium or reduced-sodium canned vegetables. Fruits All fresh, dried, or frozen fruit. Canned fruit in natural juice (without  added sugar). Meat and other protein foods Skinless chicken or turkey. Ground chicken or turkey. Pork with fat trimmed off. Fish and seafood. Egg whites. Dried beans, peas, or lentils. Unsalted nuts, nut butters, and seeds. Unsalted canned beans. Lean cuts of beef with fat trimmed off. Low-sodium, lean deli meat. Dairy Low-fat (1%) or fat-free (skim) milk. Fat-free, low-fat, or reduced-fat cheeses. Nonfat, low-sodium ricotta or cottage cheese. Low-fat or nonfat yogurt. Low-fat, low-sodium cheese. Fats and oils Soft margarine without trans fats. Vegetable oil. Low-fat, reduced-fat, or light mayonnaise and salad dressings (reduced-sodium). Canola, safflower, olive, soybean, and sunflower oils. Avocado. Seasoning and other foods Herbs. Spices. Seasoning mixes without salt. Unsalted popcorn and pretzels. Fat-free sweets. What foods are not recommended? The items listed may not be a complete list. Talk with your dietitian about what dietary choices are best for you. Grains Baked goods made with fat, such as croissants, muffins, or some breads. Dry pasta or rice meal packs. Vegetables Creamed or fried vegetables. Vegetables in a cheese sauce. Regular canned vegetables (not low-sodium or reduced-sodium). Regular canned tomato sauce and paste (not low-sodium or reduced-sodium). Regular tomato and vegetable juice (not low-sodium or reduced-sodium). Pickles. Olives. Fruits Canned fruit in a light or heavy syrup. Fried fruit. Fruit in cream or butter sauce. Meat and other protein foods Fatty cuts of meat. Ribs. Fried meat. Bacon. Sausage. Bologna and other processed lunch meats. Salami. Fatback. Hotdogs. Bratwurst. Salted nuts and seeds. Canned beans with added salt. Canned or smoked fish. Whole eggs or egg yolks. Chicken or turkey with skin. Dairy Whole or 2% milk, cream, and half-and-half. Whole or full-fat cream cheese. Whole-fat or sweetened yogurt. Full-fat cheese. Nondairy creamers. Whipped toppings.  Processed cheese and cheese spreads. Fats and oils Butter. Stick margarine. Lard. Shortening. Ghee. Bacon fat. Tropical oils, such as coconut, palm kernel, or palm oil. Seasoning and other foods Salted popcorn and pretzels. Onion salt, garlic salt, seasoned salt, table salt, and sea salt. Worcestershire sauce. Tartar sauce. Barbecue sauce. Teriyaki sauce. Soy sauce, including reduced-sodium. Steak sauce. Canned and packaged gravies. Fish sauce. Oyster sauce. Cocktail sauce. Horseradish that you find on the shelf. Ketchup. Mustard. Meat flavorings and tenderizers. Bouillon cubes. Hot sauce and Tabasco sauce. Premade or packaged marinades. Premade or packaged taco seasonings. Relishes. Regular salad dressings. Where to find more information:  National Heart, Lung, and Blood Institute: www.nhlbi.nih.gov  American Heart Association: www.heart.org Summary  The DASH eating plan is a healthy eating plan that has been shown to reduce high blood pressure (hypertension). It may also reduce your risk for type 2 diabetes, heart disease, and stroke.  With the DASH eating plan, you should limit salt (sodium) intake to 2,300 mg a day. If you have hypertension, you may need to reduce your sodium intake to 1,500 mg a day.  When on the DASH eating plan, aim to eat more fresh fruits and vegetables, whole grains, lean proteins, low-fat dairy, and heart-healthy fats.  Work with your health care provider or diet and nutrition specialist (dietitian) to adjust your eating plan to your individual   calorie needs. This information is not intended to replace advice given to you by your health care provider. Make sure you discuss any questions you have with your health care provider. Document Released: 10/01/2011 Document Revised: 10/05/2016 Document Reviewed: 10/05/2016 Elsevier Interactive Patient Education  2018 Elsevier Inc.  

## 2017-12-03 NOTE — Assessment & Plan Note (Signed)
Encouraged heart healthy diet, increase exercise, avoid trans fats, consider a krill oil cap daily Pt has not been taking any meds Check labs

## 2017-12-03 NOTE — Progress Notes (Signed)
Subjective:  I acted as a Education administrator for [employer], RMA   Patient ID: Joanne Harris, female    DOB: 01/21/1981, 37 y.o.   MRN: [id number]  Chief Complaint  Patient presents with  . Establish Care    HPI  Patient is in today to establish care.  She has run out of her meds and needs labs and refills.  She also c/o losing her voice since christmas.  Some night time congestion -- she started taking zyrtec which has helped --  But her voice has not gotten better.  Stress at work has worsened ---she is doing the job of 2 people and she has been stressed.  She would like to get back on her prozac and xanax  HYPERTENSION   Blood pressure range-not checking   Chest pain- no      Dyspnea- no Lightheadedness- no   Edema-   Other side effects - no   Medication compliance: na Low salt diet- no    DIABETES    Blood Sugar ranges-not checking   Polyuria- no New Visual problems- no  Hypoglycemic symptoms- no  Other side effects-no Medication compliance - poor Last eye exam- due Foot exam- pt did not want to do it today-- she was wearing boots    HYPERLIPIDEMIA  Medication compliance- poor RUQ pain- no  Muscle aches- no Other side effects-no   Patient Care Team: Carollee Herter, Alferd Apa, DO as PCP - General (Family Medicine) Marylynn Pearson, MD as Consulting Physician (Obstetrics and Gynecology)   Past Medical History:  Diagnosis Date  . Depression   . Diabetes mellitus   . Hypothyroid   . Obesity     Past Surgical History:  Procedure Laterality Date  . CHOLECYSTECTOMY      Family History  Problem Relation Age of Onset  . Anemia Mother   . Breast cancer Mother   . Arthritis Mother   . Hypertension Unknown        grandmother  . Diabetes Unknown        grandmother  . Stroke Unknown        grandfather  . Breast cancer Unknown        grandmother, late 45s  . Diabetes Maternal Grandmother   . Breast cancer Maternal Grandmother     Social History   Socioeconomic  History  . Marital status: Single    Spouse name: Not on file  . Number of children: Not on file  . Years of education: Not on file  . Highest education level: Not on file  Social Needs  . Financial resource strain: Not on file  . Food insecurity - worry: Not on file  . Food insecurity - inability: Not on file  . Transportation needs - medical: Not on file  . Transportation needs - non-medical: Not on file  Occupational History  . Not on file  Tobacco Use  . Smoking status: Never Smoker  . Smokeless tobacco: Never Used  Substance and Sexual Activity  . Alcohol use: Yes    Comment: occ.  . Drug use: No  . Sexual activity: Not on file  Other Topics Concern  . Not on file  Social History Narrative  . Not on file    Outpatient Medications Prior to Visit  Medication Sig Dispense Refill  . Ascorbic Acid (VITAMIN C) 500 MG CAPS Take 1 capsule by mouth daily. Reported on 12/18/2015    . b complex vitamins tablet Take 1 tablet by mouth daily. Reported  on 12/18/2015    . canagliflozin (INVOKANA) 100 MG TABS tablet Take 1 tablet (100 mg total) by mouth daily. 30 tablet 2  . glipiZIDE (GLUCOTROL) 5 MG tablet Take 1 tablet (5 mg total) by mouth 2 (two) times daily before a meal. 60 tablet 2  . Vitamin D, Cholecalciferol, 1000 UNITS CAPS Take 2 capsules by mouth daily. Reported on 12/02/2015    . ALPRAZolam (XANAX) 0.5 MG tablet take 1 tablet by mouth twice a day if needed for anxiety 60 tablet 0  . FLUoxetine (PROZAC) 20 MG capsule take 1 capsule by mouth once daily 90 capsule 0  . metFORMIN (GLUCOPHAGE) 1000 MG tablet take 1 tablet by mouth twice a day with food 180 tablet 0  . SYNTHROID 300 MCG tablet take 1 tablet by mouth once daily 30 tablet 0  . glucose blood (ONETOUCH VERIO) test strip Use to test blood sugar 2 times daily as instructed. (Patient not taking: Reported on 12/03/2017) 100 each 11  . ONETOUCH DELICA LANCETS FINE MISC Use to test blood sugar 2 times daily as instructed.  (Patient not taking: Reported on 12/03/2017) 100 each 11  . sitaGLIPtin (JANUVIA) 100 MG tablet Take 1 tablet (100 mg total) by mouth daily. (Patient not taking: Reported on 12/03/2017) 30 tablet 2   No facility-administered medications prior to visit.     No Known Allergies  Review of Systems  Constitutional: Negative for fever and malaise/fatigue.  HENT: Negative for congestion.   Eyes: Negative for blurred vision.  Respiratory: Negative for cough and shortness of breath.   Cardiovascular: Negative for chest pain, palpitations and leg swelling.  Gastrointestinal: Negative for vomiting.  Musculoskeletal: Negative for back pain.  Skin: Negative for rash.  Neurological: Negative for loss of consciousness and headaches.       Objective:    Physical Exam  Constitutional: She is oriented to person, place, and time. She appears well-developed and well-nourished.  HENT:  Head: Normocephalic and atraumatic.  Eyes: Conjunctivae and EOM are normal.  Neck: Normal range of motion. Neck supple. No JVD present. Carotid bruit is not present. No thyromegaly present.  Cardiovascular: Normal rate, regular rhythm and normal heart sounds.  No murmur heard. Pulmonary/Chest: Effort normal and breath sounds normal. No respiratory distress. She has no wheezes. She has no rales. She exhibits no tenderness.  Musculoskeletal: She exhibits no edema.  Neurological: She is alert and oriented to person, place, and time.  Psychiatric: She has a normal mood and affect. Her behavior is normal.  Nursing note and vitals reviewed.   BP (!) 142/76 (BP Location: Left Arm, Patient Position: Sitting, Cuff Size: Large)   Pulse (!) 106   Temp 99 F (37.2 C) (Oral)   Resp 18   Ht 5' 2.5" (1.588 m)   Wt (!) 316 lb 9.6 oz (143.6 kg)   SpO2 97%   BMI 56.98 kg/m  Wt Readings from Last 3 Encounters:  12/03/17 (!) 316 lb 9.6 oz (143.6 kg)  02/18/17 (!) 316 lb 8 oz (143.6 kg)  12/18/15 (!) 305 lb (138.3 kg)   BP  Readings from Last 3 Encounters:  12/03/17 (!) 142/76  02/18/17 134/86  12/18/15 120/80      There is no immunization history on file for this patient.  Health Maintenance  Topic Date Due  . PNEUMOCOCCAL POLYSACCHARIDE VACCINE (1) 09/14/1984  . HIV Screening  09/14/1997  . TETANUS/TDAP  09/14/2001  . OPHTHALMOLOGY EXAM  01/29/2017  . HEMOGLOBIN A1C  08/21/2017  .  INFLUENZA VACCINE  08/05/2018 (Originally 05/26/2017)  . FOOT EXAM  02/18/2018  . URINE MICROALBUMIN  02/19/2018  . PAP SMEAR  05/26/2018    Lab Results  Component Value Date   WBC 10.5 02/19/2017   HGB 12.7 02/19/2017   HCT 38.5 02/19/2017   PLT 450.0 (H) 02/19/2017   GLUCOSE 280 (H) 02/19/2017   CHOL 153 02/19/2017   TRIG 121.0 02/19/2017   HDL 48.50 02/19/2017   LDLDIRECT 130.0 10/01/2014   LDLCALC 80 02/19/2017   ALT 32 02/19/2017   AST 24 02/19/2017   NA 132 (L) 02/19/2017   K 3.7 02/19/2017   CL 99 02/19/2017   CREATININE 0.76 02/19/2017   BUN 15 02/19/2017   CO2 24 02/19/2017   TSH 24.11 (H) 02/19/2017   HGBA1C 9.7 (H) 02/19/2017   MICROALBUR 2.8 (H) 02/19/2017    Lab Results  Component Value Date   TSH 24.11 (H) 02/19/2017   Lab Results  Component Value Date   WBC 10.5 02/19/2017   HGB 12.7 02/19/2017   HCT 38.5 02/19/2017   MCV 90.5 02/19/2017   PLT 450.0 (H) 02/19/2017   Lab Results  Component Value Date   NA 132 (L) 02/19/2017   K 3.7 02/19/2017   CO2 24 02/19/2017   GLUCOSE 280 (H) 02/19/2017   BUN 15 02/19/2017   CREATININE 0.76 02/19/2017   BILITOT 0.4 02/19/2017   ALKPHOS 63 02/19/2017   AST 24 02/19/2017   ALT 32 02/19/2017   PROT 7.3 02/19/2017   ALBUMIN 4.0 02/19/2017   CALCIUM 9.5 02/19/2017   GFR 111.75 02/19/2017   Lab Results  Component Value Date   CHOL 153 02/19/2017   Lab Results  Component Value Date   HDL 48.50 02/19/2017   Lab Results  Component Value Date   LDLCALC 80 02/19/2017   Lab Results  Component Value Date   TRIG 121.0 02/19/2017    Lab Results  Component Value Date   CHOLHDL 3 02/19/2017   Lab Results  Component Value Date   HGBA1C 9.7 (H) 02/19/2017         Assessment & Plan:   Problem List Items Addressed This Visit      Unprioritized   DM (diabetes mellitus) type II uncontrolled, periph vascular disorder ([place]) - Primary    Refer to endo       Relevant Medications   metFORMIN (GLUCOPHAGE) 1000 MG tablet   losartan (COZAAR) 25 MG tablet   Other Relevant Orders   Hemoglobin A1c   Comprehensive metabolic panel   Ambulatory referral to Endocrinology   Essential hypertension    Poorly controlled will alter medications, encouraged DASH diet, minimize caffeine and obtain adequate sleep. Report concerning symptoms and follow up as directed and as needed      Relevant Medications   losartan (COZAAR) 25 MG tablet   Other Relevant Orders   POCT Urinalysis Dipstick (Automated) (Completed)   Hyperlipidemia LDL goal <70    Encouraged heart healthy diet, increase exercise, avoid trans fats, consider a krill oil cap daily Pt has not been taking any meds Check labs      Relevant Medications   losartan (COZAAR) 25 MG tablet   Other Relevant Orders   Lipid panel   Hypothyroidism    Check labs today Refer to endo      Relevant Medications   levothyroxine (SYNTHROID) 300 MCG tablet   Other Relevant Orders   TSH   Ambulatory referral to Endocrinology   Morbid obesity ([place])  Info given for healthy weight and wellness      Relevant Medications   metFORMIN (GLUCOPHAGE) 1000 MG tablet   Type 2 diabetes mellitus with hyperglycemia ([facility]) (Chronic)    Endo referral Restart metformin Check labs hgba1c to be checked, minimize simple carbs. Increase exercise as tolerated. Continue current meds      Relevant Medications   metFORMIN (GLUCOPHAGE) 1000 MG tablet   losartan (COZAAR) 25 MG tablet    Other Visit Diagnoses    Vitamin D deficiency       Relevant Orders   VITAMIN D 25 Hydroxy (Vit-D  Deficiency, Fractures)   Vitamin B12 deficiency       Relevant Orders   Vitamin B12   Anxiety       Relevant Medications   FLUoxetine (PROZAC) 20 MG capsule   ALPRAZolam (XANAX) 0.5 MG tablet   Seasonal allergies       Relevant Medications   montelukast (SINGULAIR) 10 MG tablet   High risk medication use       Relevant Orders   Pain Mgmt, Profile 8 w/Conf, U      I have changed Hazelene Siddoway's SYNTHROID to levothyroxine. I have also changed her FLUoxetine and metFORMIN. I am also having her start on losartan and montelukast. Additionally, I am having her maintain her b complex vitamins, Vitamin C, glucose blood, ONETOUCH DELICA LANCETS FINE, Vitamin D (Cholecalciferol), canagliflozin, glipiZIDE, sitaGLIPtin, and ALPRAZolam.  Meds ordered this encounter  Medications  . levothyroxine (SYNTHROID) 300 MCG tablet    Sig: Take 1 tablet (300 mcg total) by mouth daily.    Dispense:  30 tablet    Refill:  0  . FLUoxetine (PROZAC) 20 MG capsule    Sig: Take 1 capsule (20 mg total) by mouth daily.    Dispense:  90 capsule    Refill:  2  . metFORMIN (GLUCOPHAGE) 1000 MG tablet    Sig: Take 1 tablet (1,000 mg total) by mouth 2 (two) times daily with a meal.    Dispense:  180 tablet    Refill:  2  . DISCONTD: ALPRAZolam (XANAX) 0.5 MG tablet    Sig: take 1 tablet by mouth twice a day if needed for anxiety    Dispense:  60 tablet    Refill:  0  . ALPRAZolam (XANAX) 0.5 MG tablet    Sig: take 1 tablet by mouth twice a day if needed for anxiety    Dispense:  60 tablet    Refill:  0  . losartan (COZAAR) 25 MG tablet    Sig: Take 1 tablet (25 mg total) by mouth daily.    Dispense:  30 tablet    Refill:  2  . montelukast (SINGULAIR) 10 MG tablet    Sig: Take 1 tablet (10 mg total) by mouth at bedtime.    Dispense:  30 tablet    Refill:  3    CMA served as scribe during this visit. History, Physical and Plan performed by medical provider. Documentation and orders reviewed and attested  to.  Ann Held, DO

## 2017-12-03 NOTE — Assessment & Plan Note (Signed)
Poorly controlled will alter medications, encouraged DASH diet, minimize caffeine and obtain adequate sleep. Report concerning symptoms and follow up as directed and as needed 

## 2017-12-04 LAB — PAIN MGMT, PROFILE 8 W/CONF, U
6 ACETYLMORPHINE: NEGATIVE ng/mL (ref <10)
AMPHETAMINES: NEGATIVE ng/mL (ref <500)
Alcohol Metabolites: NEGATIVE ng/mL (ref <500)
BUPRENORPHINE, URINE: NEGATIVE ng/mL (ref <5)
Benzodiazepines: NEGATIVE ng/mL (ref <100)
CREATININE: 70.6 mg/dL
Cocaine Metabolite: NEGATIVE ng/mL (ref <150)
MDMA: NEGATIVE ng/mL (ref <500)
Marijuana Metabolite: NEGATIVE ng/mL (ref <20)
Opiates: NEGATIVE ng/mL (ref <100)
Oxidant: NEGATIVE ug/mL (ref <200)
Oxycodone: NEGATIVE ng/mL (ref <100)
pH: 6.18 (ref 4.5–9.0)

## 2017-12-04 LAB — HEMOGLOBIN A1C
EAG (MMOL/L): 13.6 (calc)
Hgb A1c MFr Bld: 10.2 % of total Hgb — ABNORMAL HIGH (ref <5.7)
Mean Plasma Glucose: 246 (calc)

## 2017-12-07 ENCOUNTER — Encounter: Payer: Self-pay | Admitting: Family Medicine

## 2018-01-10 ENCOUNTER — Ambulatory Visit (INDEPENDENT_AMBULATORY_CARE_PROVIDER_SITE_OTHER): Payer: 59 | Admitting: Psychology

## 2018-01-10 DIAGNOSIS — F411 Generalized anxiety disorder: Secondary | ICD-10-CM | POA: Diagnosis not present

## 2018-01-17 ENCOUNTER — Other Ambulatory Visit: Payer: Self-pay | Admitting: Family Medicine

## 2018-01-17 DIAGNOSIS — F419 Anxiety disorder, unspecified: Secondary | ICD-10-CM

## 2018-01-20 NOTE — Telephone Encounter (Signed)
[facility] in [place] requesting refill on alprazolam  Database ran and is on your desk for review.  Last filled per database: 12/03/17 Last written: 12/03/17 Last ov: 12/03/17 Next ov: 03/15/18 Contract: 12/03/18 UDS: 12/03/18

## 2018-01-24 ENCOUNTER — Ambulatory Visit: Payer: 59 | Admitting: Psychology

## 2018-01-26 ENCOUNTER — Ambulatory Visit (INDEPENDENT_AMBULATORY_CARE_PROVIDER_SITE_OTHER): Payer: 59 | Admitting: Psychology

## 2018-01-26 DIAGNOSIS — F411 Generalized anxiety disorder: Secondary | ICD-10-CM | POA: Diagnosis not present

## 2018-02-07 ENCOUNTER — Ambulatory Visit (INDEPENDENT_AMBULATORY_CARE_PROVIDER_SITE_OTHER): Payer: 59 | Admitting: Psychology

## 2018-02-07 DIAGNOSIS — F411 Generalized anxiety disorder: Secondary | ICD-10-CM | POA: Diagnosis not present

## 2018-02-21 ENCOUNTER — Ambulatory Visit (INDEPENDENT_AMBULATORY_CARE_PROVIDER_SITE_OTHER): Payer: 59 | Admitting: Psychology

## 2018-02-21 DIAGNOSIS — F411 Generalized anxiety disorder: Secondary | ICD-10-CM | POA: Diagnosis not present

## 2018-03-03 ENCOUNTER — Other Ambulatory Visit: Payer: Self-pay | Admitting: Family Medicine

## 2018-03-03 DIAGNOSIS — F419 Anxiety disorder, unspecified: Secondary | ICD-10-CM

## 2018-03-04 NOTE — Telephone Encounter (Signed)
Database ran and is on your desk for review.  Last filled per database: 01/20/18 Last written: 01/20/18 Last ov: 12/03/17 Next ov: 03/15/18 Contract: 12/03/18 UDS: 12/03/18

## 2018-03-15 ENCOUNTER — Encounter: Payer: Managed Care, Other (non HMO) | Admitting: Family Medicine

## 2018-03-28 ENCOUNTER — Ambulatory Visit (INDEPENDENT_AMBULATORY_CARE_PROVIDER_SITE_OTHER): Payer: Managed Care, Other (non HMO) | Admitting: Psychology

## 2018-03-28 DIAGNOSIS — F411 Generalized anxiety disorder: Secondary | ICD-10-CM

## 2018-04-13 ENCOUNTER — Ambulatory Visit: Payer: Self-pay | Admitting: Psychology

## 2018-04-18 ENCOUNTER — Encounter: Payer: Managed Care, Other (non HMO) | Admitting: Family Medicine

## 2018-05-02 ENCOUNTER — Ambulatory Visit (INDEPENDENT_AMBULATORY_CARE_PROVIDER_SITE_OTHER): Payer: Managed Care, Other (non HMO) | Admitting: Psychology

## 2018-05-02 DIAGNOSIS — F411 Generalized anxiety disorder: Secondary | ICD-10-CM

## 2018-05-24 ENCOUNTER — Encounter

## 2018-05-24 ENCOUNTER — Encounter: Payer: Managed Care, Other (non HMO) | Admitting: Family Medicine

## 2018-05-25 ENCOUNTER — Other Ambulatory Visit: Payer: Self-pay | Admitting: Family Medicine

## 2018-05-25 DIAGNOSIS — F419 Anxiety disorder, unspecified: Secondary | ICD-10-CM

## 2018-05-27 ENCOUNTER — Other Ambulatory Visit: Payer: Self-pay

## 2018-05-27 DIAGNOSIS — F419 Anxiety disorder, unspecified: Secondary | ICD-10-CM

## 2018-05-27 MED ORDER — ALPRAZOLAM 0.5 MG PO TABS: ORAL_TABLET | ORAL | 0 refills | Status: DC

## 2018-05-27 NOTE — Telephone Encounter (Signed)
Sent to provider for approval

## 2018-05-27 NOTE — Telephone Encounter (Signed)
Received refill request for ALPRAZolam (XANAX) 0.5 mg tablet.   Last office visit 12/13/17 Last refill 03/04/18  12/03/17:CSC completed and signed, UDS sample given.

## 2018-06-01 ENCOUNTER — Ambulatory Visit (INDEPENDENT_AMBULATORY_CARE_PROVIDER_SITE_OTHER): Payer: Managed Care, Other (non HMO) | Admitting: Psychology

## 2018-06-01 DIAGNOSIS — F411 Generalized anxiety disorder: Secondary | ICD-10-CM | POA: Diagnosis not present

## 2018-07-04 ENCOUNTER — Encounter: Payer: Managed Care, Other (non HMO) | Admitting: Family Medicine

## 2018-07-08 ENCOUNTER — Ambulatory Visit: Payer: Managed Care, Other (non HMO) | Admitting: Psychology

## 2018-07-13 ENCOUNTER — Ambulatory Visit: Payer: Managed Care, Other (non HMO) | Admitting: Psychology

## 2018-07-17 ENCOUNTER — Other Ambulatory Visit: Payer: Self-pay | Admitting: Family Medicine

## 2018-07-17 DIAGNOSIS — F419 Anxiety disorder, unspecified: Secondary | ICD-10-CM

## 2018-07-18 NOTE — Telephone Encounter (Signed)
Refill request for alprazolam 0.5mg .   Last OV: 12/03/2017, appt scheduled 09/20/2018 Last Fill: 05/27/2018 #60 and 0RF UDS: 12/03/2017 Low risk

## 2018-08-03 ENCOUNTER — Ambulatory Visit (INDEPENDENT_AMBULATORY_CARE_PROVIDER_SITE_OTHER): Payer: 59 | Admitting: Psychology

## 2018-08-03 DIAGNOSIS — F411 Generalized anxiety disorder: Secondary | ICD-10-CM

## 2018-09-06 ENCOUNTER — Ambulatory Visit (INDEPENDENT_AMBULATORY_CARE_PROVIDER_SITE_OTHER): Payer: 59 | Admitting: Psychology

## 2018-09-06 DIAGNOSIS — F411 Generalized anxiety disorder: Secondary | ICD-10-CM | POA: Diagnosis not present

## 2018-09-20 ENCOUNTER — Encounter: Payer: Self-pay | Admitting: Family Medicine

## 2018-09-20 ENCOUNTER — Ambulatory Visit (INDEPENDENT_AMBULATORY_CARE_PROVIDER_SITE_OTHER): Payer: 59 | Admitting: Psychology

## 2018-09-20 ENCOUNTER — Encounter

## 2018-09-20 ENCOUNTER — Ambulatory Visit (INDEPENDENT_AMBULATORY_CARE_PROVIDER_SITE_OTHER): Payer: Managed Care, Other (non HMO) | Admitting: Family Medicine

## 2018-09-20 VITALS — BP 140/90 | HR 100 | Temp 98.7°F | Resp 16 | Ht 63.0 in | Wt 298.0 lb

## 2018-09-20 DIAGNOSIS — E039 Hypothyroidism, unspecified: Secondary | ICD-10-CM

## 2018-09-20 DIAGNOSIS — F419 Anxiety disorder, unspecified: Secondary | ICD-10-CM

## 2018-09-20 DIAGNOSIS — E1151 Type 2 diabetes mellitus with diabetic peripheral angiopathy without gangrene: Secondary | ICD-10-CM | POA: Diagnosis not present

## 2018-09-20 DIAGNOSIS — F411 Generalized anxiety disorder: Secondary | ICD-10-CM | POA: Diagnosis not present

## 2018-09-20 DIAGNOSIS — I10 Essential (primary) hypertension: Secondary | ICD-10-CM

## 2018-09-20 DIAGNOSIS — E1165 Type 2 diabetes mellitus with hyperglycemia: Secondary | ICD-10-CM | POA: Diagnosis not present

## 2018-09-20 DIAGNOSIS — E785 Hyperlipidemia, unspecified: Secondary | ICD-10-CM

## 2018-09-20 DIAGNOSIS — IMO0002 Reserved for concepts with insufficient information to code with codable children: Secondary | ICD-10-CM

## 2018-09-20 DIAGNOSIS — Z0000 Encounter for general adult medical examination without abnormal findings: Secondary | ICD-10-CM

## 2018-09-20 MED ORDER — ALPRAZOLAM 0.5 MG PO TABS: 0.5000 mg | ORAL_TABLET | Freq: Two times a day (BID) | ORAL | 0 refills | Status: DC | PRN

## 2018-09-20 MED ORDER — LOSARTAN POTASSIUM 25 MG PO TABS: 25.0000 mg | ORAL_TABLET | Freq: Every day | ORAL | 2 refills | Status: DC

## 2018-09-20 NOTE — Progress Notes (Signed)
Subjective:     Joanne Harris is a 37 y.o. female and is here for a comprehensive physical exam. The patient reports no problems.  HPI HYPERTENSION   Blood pressure range-not checking   Chest pain- no      Dyspnea- no Lightheadedness- no   Edema- no  Other side effects - no   Medication compliance: good Low salt diet- yes    DIABETES    Blood Sugar ranges-good per pt  Polyuria- no New Visual problems- no  Hypoglycemic symptoms- no  Other side effects-no Medication compliance - good Last eye exam- due Foot exam- today   HYPERLIPIDEMIA  Medication compliance- good RUQ pain- no  Muscle aches- no Other side effects-no       Social History   Socioeconomic History  . Marital status: Single    Spouse name: Not on file  . Number of children: Not on file  . Years of education: Not on file  . Highest education level: Not on file  Occupational History  . Not on file  Social Needs  . Financial resource strain: Not on file  . Food insecurity:    Worry: Not on file    Inability: Not on file  . Transportation needs:    Medical: Not on file    Non-medical: Not on file  Tobacco Use  . Smoking status: Never Smoker  . Smokeless tobacco: Never Used  Substance and Sexual Activity  . Alcohol use: Yes    Comment: occ.  . Drug use: No  . Sexual activity: Not on file  Lifestyle  . Physical activity:    Days per week: Not on file    Minutes per session: Not on file  . Stress: Not on file  Relationships  . Social connections:    Talks on phone: Not on file    Gets together: Not on file    Attends religious service: Not on file    Active member of club or organization: Not on file    Attends meetings of clubs or organizations: Not on file    Relationship status: Not on file  . Intimate partner violence:    Fear of current or ex partner: Not on file    Emotionally abused: Not on file    Physically abused: Not on file    Forced sexual activity: Not on file  Other  Topics Concern  . Not on file  Social History Narrative  . Not on file   Health Maintenance  Topic Date Due  . PNEUMOCOCCAL POLYSACCHARIDE VACCINE AGE 44-64 HIGH RISK  09/14/1984  . HIV Screening  09/14/1997  . TETANUS/TDAP  09/14/2001  . OPHTHALMOLOGY EXAM  01/29/2017  . PAP SMEAR  05/26/2018  . INFLUENZA VACCINE  06/27/2019 (Originally 05/26/2018)  . HEMOGLOBIN A1C  03/22/2019  . FOOT EXAM  09/21/2019    The following portions of the patient's history were reviewed and updated as appropriate: She  has a past medical history of Depression, Diabetes mellitus, Hypothyroid, and Obesity. She does not have any pertinent problems on file. She  has a past surgical history that includes Cholecystectomy. Her family history includes Anemia in her mother; Arthritis in her mother; Breast cancer in her maternal grandmother, mother, and unknown relative; Diabetes in her maternal grandmother and unknown relative; Hypertension in her unknown relative; Stroke in her unknown relative. She  reports that she has never smoked. She has never used smokeless tobacco. She reports that she drinks alcohol. She reports that she does not  use drugs. She has a current medication list which includes the following prescription(s): alprazolam, vitamin c, b complex vitamins, canagliflozin, fluoxetine, glucose blood, levothyroxine, losartan, metformin, montelukast, onetouch delica lancets fine, and vitamin d (cholecalciferol). Current Outpatient Medications on File Prior to Visit  Medication Sig Dispense Refill  . Ascorbic Acid (VITAMIN C) 500 MG CAPS Take 1 capsule by mouth daily. Reported on 12/18/2015    . b complex vitamins tablet Take 1 tablet by mouth daily. Reported on 12/18/2015    . canagliflozin (INVOKANA) 100 MG TABS tablet Take 1 tablet (100 mg total) by mouth daily. 30 tablet 2  . FLUoxetine (PROZAC) 20 MG capsule Take 1 capsule (20 mg total) by mouth daily. 90 capsule 2  . glucose blood (ONETOUCH VERIO) test  strip Use to test blood sugar 2 times daily as instructed. 100 each 11  . levothyroxine (SYNTHROID) 300 MCG tablet Take 1 tablet (300 mcg total) by mouth daily. 30 tablet 0  . metFORMIN (GLUCOPHAGE) 1000 MG tablet Take 1 tablet (1,000 mg total) by mouth 2 (two) times daily with a meal. 180 tablet 2  . montelukast (SINGULAIR) 10 MG tablet Take 1 tablet (10 mg total) by mouth at bedtime. 30 tablet 3  . ONETOUCH DELICA LANCETS FINE MISC Use to test blood sugar 2 times daily as instructed. 100 each 11  . Vitamin D, Cholecalciferol, 1000 UNITS CAPS Take 2 capsules by mouth daily. Reported on 12/02/2015     No current facility-administered medications on file prior to visit.    She has No Known Allergies..  Review of Systems Review of Systems  Constitutional: Negative for activity change, appetite change and fatigue.  HENT: Negative for hearing loss, congestion, tinnitus and ear discharge.  dentist q75m Eyes: Negative for visual disturbance (see optho q1y -- vision corrected to 20/20 with glasses).  Respiratory: Negative for cough, chest tightness and shortness of breath.   Cardiovascular: Negative for chest pain, palpitations and leg swelling.  Gastrointestinal: Negative for abdominal pain, diarrhea, constipation and abdominal distention.  Genitourinary: Negative for urgency, frequency, decreased urine volume and difficulty urinating.  Musculoskeletal: Negative for back pain, arthralgias and gait problem.  Skin: Negative for color change, pallor and rash.  Neurological: Negative for dizziness, light-headedness, numbness and headaches.  Hematological: Negative for adenopathy. Does not bruise/bleed easily.  Psychiatric/Behavioral: Negative for suicidal ideas, confusion, sleep disturbance, self-injury, dysphoric mood, decreased concentration and agitation.       Objective:    BP 140/90   Pulse 100   Temp 98.7 F (37.1 C) (Oral)   Resp 16   Ht 5\' 3"  (1.6 m)   Wt 298 lb (135.2 kg)   SpO2  100%   BMI 52.79 kg/m  General appearance: alert, cooperative, appears stated age and no distress Head: Normocephalic, without obvious abnormality, atraumatic Eyes: negative findings: lids and lashes normal Ears: normal TM's and external ear canals both ears Nose: Nares normal. Septum midline. Mucosa normal. No drainage or sinus tenderness. Throat: lips, mucosa, and tongue normal; teeth and gums normal Neck: no adenopathy, no carotid bruit, no JVD, supple, symmetrical, trachea midline and thyroid not enlarged, symmetric, no tenderness/mass/nodules Back: symmetric, no curvature. ROM normal. No CVA tenderness. Lungs: clear to auscultation bilaterally Breasts: gyn Heart: regular rate and rhythm, S1, S2 normal, no murmur, click, rub or gallop Abdomen: soft, non-tender; bowel sounds normal; no masses,  no organomegaly Pelvic: deferred Extremities: extremities normal, atraumatic, no cyanosis or edema Pulses: 2+ and symmetric Skin: Skin color, texture, turgor normal. No  rashes or lesions Lymph nodes: Cervical, supraclavicular, and axillary nodes normal. Neurologic: Alert and oriented X 3, normal strength and tone. Normal symmetric reflexes. Normal coordination and gait    Diabetic Foot Exam - Simple   Simple Foot Form Diabetic Foot exam was performed with the following findings:  Yes 09/23/2018  7:19 PM  Visual Inspection No deformities, no ulcerations, no other skin breakdown bilaterally:  Yes Sensation Testing Intact to touch and monofilament testing bilaterally:  Yes Pulse Check Posterior Tibialis and Dorsalis pulse intact bilaterally:  Yes Comments     Assessment:    Healthy female exam.      Plan:    ghm utd Check labs  See After Visit Summary for Counseling Recommendations    1. DM (diabetes mellitus) type II uncontrolled, periph vascular disorder ([place]) Check labs  hgba1c to be checked.  , minimize simple carbs. Increase exercise as tolerated. Continue current meds  -  TSH - Lipid panel - CBC with Differential/Platelet - Comprehensive metabolic panel - Hemoglobin A1c  2. Hyperlipidemia LDL goal <70 Encouraged heart healthy diet, increase exercise, avoid trans fats, consider a krill oil cap daily - Lipid panel - Comprehensive metabolic panel - LDL cholesterol, direct  3. Hypothyroidism, unspecified type Check labs  con't synthroid - TSH  4. Essential hypertension Well controlled, no changes to meds. Encouraged heart healthy diet such as the DASH diet and exercise as tolerated.  - losartan (COZAAR) 25 MG tablet; Take 1 tablet (25 mg total) by mouth daily.  Dispense: 30 tablet; Refill: 2  5. Anxiety stable - ALPRAZolam (XANAX) 0.5 MG tablet; Take 1 tablet (0.5 mg total) by mouth 2 (two) times daily as needed. for anxiety  Dispense: 60 tablet; Refill: 0  6. Preventative health care See above - Amb Ref to Medical Weight Management

## 2018-09-20 NOTE — Patient Instructions (Signed)
Preventive Care 18-39 Years, Female Preventive care refers to lifestyle choices and visits with your health care provider that can promote health and wellness. What does preventive care include?  A yearly physical exam. This is also called an annual well check.  Dental exams once or twice a year.  Routine eye exams. Ask your health care provider how often you should have your eyes checked.  Personal lifestyle choices, including: ? Daily care of your teeth and gums. ? Regular physical activity. ? Eating a healthy diet. ? Avoiding tobacco and drug use. ? Limiting alcohol use. ? Practicing safe sex. ? Taking vitamin and mineral supplements as recommended by your health care provider. What happens during an annual well check? The services and screenings done by your health care provider during your annual well check will depend on your age, overall health, lifestyle risk factors, and family history of disease. Counseling Your health care provider may ask you questions about your:  Alcohol use.  Tobacco use.  Drug use.  Emotional well-being.  Home and relationship well-being.  Sexual activity.  Eating habits.  Work and work Statistician.  Method of birth control.  Menstrual cycle.  Pregnancy history.  Screening You may have the following tests or measurements:  Height, weight, and BMI.  Diabetes screening. This is done by checking your blood sugar (glucose) after you have not eaten for a while (fasting).  Blood pressure.  Lipid and cholesterol levels. These may be checked every 5 years starting at age 66.  Skin check.  Hepatitis C blood test.  Hepatitis B blood test.  Sexually transmitted disease (STD) testing.  BRCA-related cancer screening. This may be done if you have a family history of breast, ovarian, tubal, or peritoneal cancers.  Pelvic exam and Pap test. This may be done every 3 years starting at age 40. Starting at age 59, this may be done every 5  years if you have a Pap test in combination with an HPV test.  Discuss your test results, treatment options, and if necessary, the need for more tests with your health care provider. Vaccines Your health care provider may recommend certain vaccines, such as:  Influenza vaccine. This is recommended every year.  Tetanus, diphtheria, and acellular pertussis (Tdap, Td) vaccine. You may need a Td booster every 10 years.  Varicella vaccine. You may need this if you have not been vaccinated.  HPV vaccine. If you are 69 or younger, you may need three doses over 6 months.  Measles, mumps, and rubella (MMR) vaccine. You may need at least one dose of MMR. You may also need a second dose.  Pneumococcal 13-valent conjugate (PCV13) vaccine. You may need this if you have certain conditions and were not previously vaccinated.  Pneumococcal polysaccharide (PPSV23) vaccine. You may need one or two doses if you smoke cigarettes or if you have certain conditions.  Meningococcal vaccine. One dose is recommended if you are age 27-21 years and a first-year college student living in a residence hall, or if you have one of several medical conditions. You may also need additional booster doses.  Hepatitis A vaccine. You may need this if you have certain conditions or if you travel or work in places where you may be exposed to hepatitis A.  Hepatitis B vaccine. You may need this if you have certain conditions or if you travel or work in places where you may be exposed to hepatitis B.  Haemophilus influenzae type b (Hib) vaccine. You may need this if  you have certain risk factors.  Talk to your health care provider about which screenings and vaccines you need and how often you need them. This information is not intended to replace advice given to you by your health care provider. Make sure you discuss any questions you have with your health care provider. Document Released: 12/08/2001 Document Revised: 07/01/2016  Document Reviewed: 08/13/2015 Elsevier Interactive Patient Education  [employer].

## 2018-09-21 ENCOUNTER — Other Ambulatory Visit: Payer: Managed Care, Other (non HMO)

## 2018-09-21 DIAGNOSIS — E1151 Type 2 diabetes mellitus with diabetic peripheral angiopathy without gangrene: Secondary | ICD-10-CM

## 2018-09-21 DIAGNOSIS — E1165 Type 2 diabetes mellitus with hyperglycemia: Principal | ICD-10-CM

## 2018-09-21 DIAGNOSIS — IMO0002 Reserved for concepts with insufficient information to code with codable children: Secondary | ICD-10-CM

## 2018-09-21 LAB — CBC WITH DIFFERENTIAL/PLATELET
Basophils Absolute: 0.1 10*3/uL (ref 0.0–0.1)
Basophils Relative: 1 % (ref 0.0–3.0)
EOS ABS: 0.2 10*3/uL (ref 0.0–0.7)
Eosinophils Relative: 1.5 % (ref 0.0–5.0)
HCT: 44.2 % (ref 36.0–46.0)
HEMOGLOBIN: 14.7 g/dL (ref 12.0–15.0)
LYMPHS PCT: 23.8 % (ref 12.0–46.0)
Lymphs Abs: 3.2 10*3/uL (ref 0.7–4.0)
MCHC: 33.2 g/dL (ref 30.0–36.0)
MCV: 91.8 fl (ref 78.0–100.0)
Monocytes Absolute: 0.4 10*3/uL (ref 0.1–1.0)
Monocytes Relative: 2.8 % — ABNORMAL LOW (ref 3.0–12.0)
Neutro Abs: 9.7 10*3/uL — ABNORMAL HIGH (ref 1.4–7.7)
Neutrophils Relative %: 70.9 % (ref 43.0–77.0)
Platelets: 480 10*3/uL — ABNORMAL HIGH (ref 150.0–400.0)
RBC: 4.82 Mil/uL (ref 3.87–5.11)
RDW: 13.9 % (ref 11.5–15.5)
WBC: 13.7 10*3/uL — ABNORMAL HIGH (ref 4.0–10.5)

## 2018-09-21 LAB — COMPREHENSIVE METABOLIC PANEL
ALK PHOS: 71 U/L (ref 39–117)
ALT: 14 U/L (ref 0–35)
AST: 11 U/L (ref 0–37)
Albumin: 4.6 g/dL (ref 3.5–5.2)
BILIRUBIN TOTAL: 0.6 mg/dL (ref 0.2–1.2)
BUN: 8 mg/dL (ref 6–23)
CO2: 21 mEq/L (ref 19–32)
CREATININE: 0.85 mg/dL (ref 0.40–1.20)
Calcium: 10.4 mg/dL (ref 8.4–10.5)
Chloride: 95 mEq/L — ABNORMAL LOW (ref 96–112)
GFR: 97.31 mL/min (ref >60.00)
Glucose, Bld: 419 mg/dL — ABNORMAL HIGH (ref 70–99)
Potassium: 4.1 mEq/L (ref 3.5–5.1)
Sodium: 130 mEq/L — ABNORMAL LOW (ref 135–145)
Total Protein: 8.3 g/dL (ref 6.0–8.3)

## 2018-09-21 LAB — LIPID PANEL
Cholesterol: 279 mg/dL — ABNORMAL HIGH (ref 0–200)
HDL: 51.7 mg/dL (ref >39.00)
Total CHOL/HDL Ratio: 5

## 2018-09-21 LAB — TSH: TSH: 139.1 u[IU]/mL — AB (ref 0.35–4.50)

## 2018-09-21 LAB — LDL CHOLESTEROL, DIRECT: LDL DIRECT: 192 mg/dL

## 2018-09-22 LAB — HEMOGLOBIN A1C
Est. average glucose Bld gHb Est-mCnc: 240 mg/dL
HEMOGLOBIN A1C: 10 % — AB (ref 4.8–5.6)

## 2018-10-13 ENCOUNTER — Ambulatory Visit: Payer: 59 | Admitting: Psychology

## 2018-10-18 ENCOUNTER — Ambulatory Visit: Payer: 59 | Admitting: Psychology

## 2018-11-12 ENCOUNTER — Other Ambulatory Visit: Payer: Self-pay | Admitting: Family Medicine

## 2018-11-12 DIAGNOSIS — F419 Anxiety disorder, unspecified: Secondary | ICD-10-CM

## 2018-11-15 NOTE — Telephone Encounter (Signed)
Database ran and is on your desk for review.  Last filled per database: 09/20/18 Last written: 09/20/18 Last ov: 09/20/18 Next ov: 03/22/18 Contract: 12/03/18 UDS: 12/03/18

## 2018-11-18 ENCOUNTER — Ambulatory Visit: Payer: 59 | Admitting: Psychology

## 2018-12-12 ENCOUNTER — Ambulatory Visit: Payer: Self-pay | Admitting: Psychology

## 2018-12-20 LAB — HM DIABETES EYE EXAM

## 2018-12-22 ENCOUNTER — Other Ambulatory Visit: Payer: Self-pay | Admitting: Family Medicine

## 2018-12-22 ENCOUNTER — Telehealth: Payer: Self-pay | Admitting: *Deleted

## 2018-12-22 DIAGNOSIS — F419 Anxiety disorder, unspecified: Secondary | ICD-10-CM

## 2018-12-22 NOTE — Telephone Encounter (Signed)
Received Medical records from [facility]; forwarded to provider/SLS 02/27

## 2018-12-29 ENCOUNTER — Encounter: Payer: Self-pay | Admitting: Family Medicine

## 2019-01-02 ENCOUNTER — Ambulatory Visit (INDEPENDENT_AMBULATORY_CARE_PROVIDER_SITE_OTHER): Payer: 59 | Admitting: Psychology

## 2019-01-02 DIAGNOSIS — F411 Generalized anxiety disorder: Secondary | ICD-10-CM | POA: Diagnosis not present

## 2019-01-06 ENCOUNTER — Encounter: Payer: Self-pay | Admitting: Family Medicine

## 2019-01-25 ENCOUNTER — Other Ambulatory Visit: Payer: Self-pay | Admitting: Family Medicine

## 2019-01-25 DIAGNOSIS — I10 Essential (primary) hypertension: Secondary | ICD-10-CM

## 2019-02-05 ENCOUNTER — Other Ambulatory Visit: Payer: Self-pay | Admitting: Family Medicine

## 2019-02-05 DIAGNOSIS — F419 Anxiety disorder, unspecified: Secondary | ICD-10-CM

## 2019-02-06 NOTE — Telephone Encounter (Signed)
Last written: 11/15/18 Last ov: 09/20/18 Next ov: 03/23/19 Contract: will get at 03/23/19 visit UDS: will get at 03/23/19 visit

## 2019-02-10 ENCOUNTER — Ambulatory Visit (INDEPENDENT_AMBULATORY_CARE_PROVIDER_SITE_OTHER): Payer: 59 | Admitting: Psychology

## 2019-02-10 DIAGNOSIS — F411 Generalized anxiety disorder: Secondary | ICD-10-CM | POA: Diagnosis not present

## 2019-02-27 ENCOUNTER — Ambulatory Visit: Payer: 59 | Admitting: Psychology

## 2019-03-13 ENCOUNTER — Ambulatory Visit (INDEPENDENT_AMBULATORY_CARE_PROVIDER_SITE_OTHER): Payer: 59 | Admitting: Psychology

## 2019-03-13 DIAGNOSIS — F411 Generalized anxiety disorder: Secondary | ICD-10-CM | POA: Diagnosis not present

## 2019-03-22 ENCOUNTER — Telehealth: Payer: Self-pay | Admitting: *Deleted

## 2019-03-22 NOTE — Telephone Encounter (Signed)
Called to see if we can change appt for tomorrow to virtual.  No answer/ no vm  Will try to call again later.

## 2019-03-23 ENCOUNTER — Ambulatory Visit: Payer: Self-pay | Admitting: Family Medicine

## 2019-04-05 ENCOUNTER — Ambulatory Visit: Payer: 59 | Admitting: Psychology

## 2019-04-14 ENCOUNTER — Ambulatory Visit: Payer: Self-pay | Admitting: Family Medicine

## 2019-05-22 ENCOUNTER — Ambulatory Visit: Payer: Self-pay | Admitting: Family Medicine

## 2019-05-26 ENCOUNTER — Ambulatory Visit: Payer: 59 | Admitting: Psychology

## 2019-06-05 ENCOUNTER — Ambulatory Visit: Payer: Self-pay | Admitting: Family Medicine

## 2019-11-06 ENCOUNTER — Encounter: Payer: Self-pay | Admitting: Family Medicine

## 2019-11-06 DIAGNOSIS — E039 Hypothyroidism, unspecified: Secondary | ICD-10-CM

## 2019-11-06 DIAGNOSIS — IMO0002 Reserved for concepts with insufficient information to code with codable children: Secondary | ICD-10-CM

## 2019-11-06 DIAGNOSIS — E1151 Type 2 diabetes mellitus with diabetic peripheral angiopathy without gangrene: Secondary | ICD-10-CM

## 2019-11-07 ENCOUNTER — Ambulatory Visit (INDEPENDENT_AMBULATORY_CARE_PROVIDER_SITE_OTHER): Payer: 59 | Admitting: Psychology

## 2019-11-07 DIAGNOSIS — F411 Generalized anxiety disorder: Secondary | ICD-10-CM | POA: Diagnosis not present

## 2019-11-07 MED ORDER — METFORMIN HCL 1000 MG PO TABS: 1000.0000 mg | ORAL_TABLET | Freq: Two times a day (BID) | ORAL | 0 refills | Status: DC

## 2019-11-07 MED ORDER — LEVOTHYROXINE SODIUM 300 MCG PO TABS: 300.0000 ug | ORAL_TABLET | Freq: Every day | ORAL | 0 refills | Status: DC

## 2019-12-04 LAB — HM DIABETES EYE EXAM

## 2019-12-06 ENCOUNTER — Ambulatory Visit (INDEPENDENT_AMBULATORY_CARE_PROVIDER_SITE_OTHER): Payer: 59 | Admitting: Psychology

## 2019-12-06 DIAGNOSIS — F411 Generalized anxiety disorder: Secondary | ICD-10-CM

## 2019-12-14 ENCOUNTER — Encounter: Payer: Self-pay | Admitting: Family Medicine

## 2019-12-19 ENCOUNTER — Other Ambulatory Visit: Payer: 59

## 2019-12-19 ENCOUNTER — Ambulatory Visit (INDEPENDENT_AMBULATORY_CARE_PROVIDER_SITE_OTHER): Payer: 59 | Admitting: Family Medicine

## 2019-12-19 ENCOUNTER — Other Ambulatory Visit: Payer: Self-pay

## 2019-12-19 ENCOUNTER — Encounter: Payer: Self-pay | Admitting: Family Medicine

## 2019-12-19 ENCOUNTER — Encounter: Payer: Self-pay | Admitting: *Deleted

## 2019-12-19 VITALS — BP 130/80 | HR 111 | Temp 97.4°F | Resp 18 | Ht 63.0 in | Wt 283.8 lb

## 2019-12-19 DIAGNOSIS — E1165 Type 2 diabetes mellitus with hyperglycemia: Secondary | ICD-10-CM

## 2019-12-19 DIAGNOSIS — E1169 Type 2 diabetes mellitus with other specified complication: Secondary | ICD-10-CM | POA: Diagnosis not present

## 2019-12-19 DIAGNOSIS — F419 Anxiety disorder, unspecified: Secondary | ICD-10-CM

## 2019-12-19 DIAGNOSIS — Z23 Encounter for immunization: Secondary | ICD-10-CM

## 2019-12-19 DIAGNOSIS — I10 Essential (primary) hypertension: Secondary | ICD-10-CM | POA: Diagnosis not present

## 2019-12-19 DIAGNOSIS — Z0000 Encounter for general adult medical examination without abnormal findings: Secondary | ICD-10-CM

## 2019-12-19 DIAGNOSIS — Z803 Family history of malignant neoplasm of breast: Secondary | ICD-10-CM

## 2019-12-19 DIAGNOSIS — IMO0002 Reserved for concepts with insufficient information to code with codable children: Secondary | ICD-10-CM

## 2019-12-19 DIAGNOSIS — E1151 Type 2 diabetes mellitus with diabetic peripheral angiopathy without gangrene: Secondary | ICD-10-CM | POA: Diagnosis not present

## 2019-12-19 DIAGNOSIS — E039 Hypothyroidism, unspecified: Secondary | ICD-10-CM | POA: Diagnosis not present

## 2019-12-19 DIAGNOSIS — E785 Hyperlipidemia, unspecified: Secondary | ICD-10-CM | POA: Diagnosis not present

## 2019-12-19 DIAGNOSIS — N644 Mastodynia: Secondary | ICD-10-CM

## 2019-12-19 LAB — CBC WITH DIFFERENTIAL/PLATELET
Basophils Absolute: 0.1 10*3/uL (ref 0.0–0.1)
Basophils Relative: 0.6 % (ref 0.0–3.0)
Eosinophils Absolute: 0.1 10*3/uL (ref 0.0–0.7)
Eosinophils Relative: 0.8 % (ref 0.0–5.0)
HCT: 42.7 % (ref 36.0–46.0)
Hemoglobin: 14.6 g/dL (ref 12.0–15.0)
Lymphocytes Relative: 21.8 % (ref 12.0–46.0)
Lymphs Abs: 3.1 10*3/uL (ref 0.7–4.0)
MCHC: 34.3 g/dL (ref 30.0–36.0)
MCV: 97.8 fl (ref 78.0–100.0)
Monocytes Absolute: 0.7 10*3/uL (ref 0.1–1.0)
Monocytes Relative: 4.8 % (ref 3.0–12.0)
Neutro Abs: 10.2 10*3/uL — ABNORMAL HIGH (ref 1.4–7.7)
Neutrophils Relative %: 72 % (ref 43.0–77.0)
Platelets: 539 10*3/uL — ABNORMAL HIGH (ref 150.0–400.0)
RBC: 4.36 Mil/uL (ref 3.87–5.11)
RDW: 11.8 % (ref 11.5–15.5)
WBC: 14.2 10*3/uL — ABNORMAL HIGH (ref 4.0–10.5)

## 2019-12-19 LAB — COMPREHENSIVE METABOLIC PANEL
ALT: 18 U/L (ref 0–35)
AST: 11 U/L (ref 0–37)
Albumin: 4.6 g/dL (ref 3.5–5.2)
Alkaline Phosphatase: 74 U/L (ref 39–117)
BUN: 9 mg/dL (ref 6–23)
CO2: 23 mEq/L (ref 19–32)
Calcium: 10.2 mg/dL (ref 8.4–10.5)
Chloride: 96 mEq/L (ref 96–112)
Creatinine, Ser: 0.66 mg/dL (ref 0.40–1.20)
GFR: 121.76 mL/min (ref >60.00)
Glucose, Bld: 197 mg/dL — ABNORMAL HIGH (ref 70–99)
Potassium: 4.4 mEq/L (ref 3.5–5.1)
Sodium: 131 mEq/L — ABNORMAL LOW (ref 135–145)
Total Bilirubin: 0.7 mg/dL (ref 0.2–1.2)
Total Protein: 8.1 g/dL (ref 6.0–8.3)

## 2019-12-19 LAB — LIPID PANEL
Cholesterol: 171 mg/dL (ref 0–200)
HDL: 43.9 mg/dL (ref >39.00)
LDL Cholesterol: 99 mg/dL (ref 0–99)
NonHDL: 127.41
Total CHOL/HDL Ratio: 4
Triglycerides: 143 mg/dL (ref 0.0–149.0)
VLDL: 28.6 mg/dL (ref 0.0–40.0)

## 2019-12-19 LAB — MICROALBUMIN / CREATININE URINE RATIO
Creatinine,U: 90 mg/dL
Microalb Creat Ratio: 0.9 mg/g (ref 0.0–30.0)
Microalb, Ur: 0.8 mg/dL (ref 0.0–1.9)

## 2019-12-19 MED ORDER — ALPRAZOLAM 0.5 MG PO TABS: ORAL_TABLET | ORAL | 1 refills | Status: DC

## 2019-12-19 MED ORDER — METFORMIN HCL 1000 MG PO TABS: 1000.0000 mg | ORAL_TABLET | Freq: Two times a day (BID) | ORAL | 2 refills | Status: DC

## 2019-12-19 MED ORDER — FLUOXETINE HCL 20 MG PO CAPS: 20.0000 mg | ORAL_CAPSULE | Freq: Every day | ORAL | 3 refills | Status: DC

## 2019-12-19 MED ORDER — LOSARTAN POTASSIUM 25 MG PO TABS: ORAL_TABLET | ORAL | 1 refills | Status: DC

## 2019-12-19 MED ORDER — FLUOXETINE HCL 40 MG PO CAPS: 40.0000 mg | ORAL_CAPSULE | Freq: Every day | ORAL | 3 refills | Status: DC

## 2019-12-19 MED ORDER — LEVOTHYROXINE SODIUM 300 MCG PO TABS: 300.0000 ug | ORAL_TABLET | Freq: Every day | ORAL | 2 refills | Status: DC

## 2019-12-19 NOTE — Patient Instructions (Signed)
Preventive Care 21-39 Years Old, Female Preventive care refers to visits with your health care provider and lifestyle choices that can promote health and wellness. This includes:  A yearly physical exam. This may also be called an annual well check.  Regular dental visits and eye exams.  Immunizations.  Screening for certain conditions.  Healthy lifestyle choices, such as eating a healthy diet, getting regular exercise, not using drugs or products that contain nicotine and tobacco, and limiting alcohol use. What can I expect for my preventive care visit? Physical exam Your health care provider will check your:  Height and weight. This may be used to calculate body mass index (BMI), which tells if you are at a healthy weight.  Heart rate and blood pressure.  Skin for abnormal spots. Counseling Your health care provider may ask you questions about your:  Alcohol, tobacco, and drug use.  Emotional well-being.  Home and relationship well-being.  Sexual activity.  Eating habits.  Work and work environment.  Method of birth control.  Menstrual cycle.  Pregnancy history. What immunizations do I need?  Influenza (flu) vaccine  This is recommended every year. Tetanus, diphtheria, and pertussis (Tdap) vaccine  You may need a Td booster every 10 years. Varicella (chickenpox) vaccine  You may need this if you have not been vaccinated. Human papillomavirus (HPV) vaccine  If recommended by your health care provider, you may need three doses over 6 months. Measles, mumps, and rubella (MMR) vaccine  You may need at least one dose of MMR. You may also need a second dose. Meningococcal conjugate (MenACWY) vaccine  One dose is recommended if you are age 19-21 years and a first-year college student living in a residence hall, or if you have one of several medical conditions. You may also need additional booster doses. Pneumococcal conjugate (PCV13) vaccine  You may need  this if you have certain conditions and were not previously vaccinated. Pneumococcal polysaccharide (PPSV23) vaccine  You may need one or two doses if you smoke cigarettes or if you have certain conditions. Hepatitis A vaccine  You may need this if you have certain conditions or if you travel or work in places where you may be exposed to hepatitis A. Hepatitis B vaccine  You may need this if you have certain conditions or if you travel or work in places where you may be exposed to hepatitis B. Haemophilus influenzae type b (Hib) vaccine  You may need this if you have certain conditions. You may receive vaccines as individual doses or as more than one vaccine together in one shot (combination vaccines). Talk with your health care provider about the risks and benefits of combination vaccines. What tests do I need?  Blood tests  Lipid and cholesterol levels. These may be checked every 5 years starting at age 20.  Hepatitis C test.  Hepatitis B test. Screening  Diabetes screening. This is done by checking your blood sugar (glucose) after you have not eaten for a while (fasting).  Sexually transmitted disease (STD) testing.  BRCA-related cancer screening. This may be done if you have a family history of breast, ovarian, tubal, or peritoneal cancers.  Pelvic exam and Pap test. This may be done every 3 years starting at age 21. Starting at age 30, this may be done every 5 years if you have a Pap test in combination with an HPV test. Talk with your health care provider about your test results, treatment options, and if necessary, the need for more tests.   Follow these instructions at home: Eating and drinking   Eat a diet that includes fresh fruits and vegetables, whole grains, lean protein, and low-fat dairy.  Take vitamin and mineral supplements as recommended by your health care provider.  Do not drink alcohol if: ? Your health care provider tells you not to drink. ? You are  pregnant, may be pregnant, or are planning to become pregnant.  If you drink alcohol: ? Limit how much you have to 0-1 drink a day. ? Be aware of how much alcohol is in your drink. In the [place] one drink equals one 12 oz bottle of beer (355 mL), one 5 oz glass of wine (148 mL), or one 1 oz glass of hard liquor (44 mL). Lifestyle  Take daily care of your teeth and gums.  Stay active. Exercise for at least 30 minutes on 5 or more days each week.  Do not use any products that contain nicotine or tobacco, such as cigarettes, e-cigarettes, and chewing tobacco. If you need help quitting, ask your health care provider.  If you are sexually active, practice safe sex. Use a condom or other form of birth control (contraception) in order to prevent pregnancy and STIs (sexually transmitted infections). If you plan to become pregnant, see your health care provider for a preconception visit. What's next?  Visit your health care provider once a year for a well check visit.  Ask your health care provider how often you should have your eyes and teeth checked.  Stay up to date on all vaccines. This information is not intended to replace advice given to you by your health care provider. Make sure you discuss any questions you have with your health care provider. Document Revised: 06/23/2018 Document Reviewed: 06/23/2018 Elsevier Patient Education  2020 [employer].

## 2019-12-19 NOTE — Assessment & Plan Note (Signed)
Pt requested a referral to a new endo Referral placed Check labs

## 2019-12-19 NOTE — Assessment & Plan Note (Signed)
Tolerating statin, encouraged heart healthy diet, avoid trans fats, minimize simple carbs and saturated fats. Increase exercise as tolerated 

## 2019-12-19 NOTE — Assessment & Plan Note (Signed)
Well controlled, no changes to meds. Encouraged heart healthy diet such as the DASH diet and exercise as tolerated.  °

## 2019-12-19 NOTE — Progress Notes (Signed)
Subjective:     Joanne Harris is a 39 y.o. female and is here for a comprehensive physical exam. The patient reports problems - she would like a referral to a new endo and she c/o inc anxiety and L breast pain .  Social History   Socioeconomic History  . Marital status: Single    Spouse name: Not on file  . Number of children: Not on file  . Years of education: Not on file  . Highest education level: Not on file  Occupational History  . Not on file  Tobacco Use  . Smoking status: Never Smoker  . Smokeless tobacco: Never Used  Substance and Sexual Activity  . Alcohol use: Yes    Comment: occ.  . Drug use: No  . Sexual activity: Not on file  Other Topics Concern  . Not on file  Social History Narrative  . Not on file   Social Determinants of Health   Financial Resource Strain:   . Difficulty of Paying Living Expenses: Not on file  Food Insecurity:   . Worried About Charity fundraiser in the Last Year: Not on file  . Ran Out of Food in the Last Year: Not on file  Transportation Needs:   . Lack of Transportation (Medical): Not on file  . Lack of Transportation (Non-Medical): Not on file  Physical Activity:   . Days of Exercise per Week: Not on file  . Minutes of Exercise per Session: Not on file  Stress:   . Feeling of Stress : Not on file  Social Connections:   . Frequency of Communication with Friends and Family: Not on file  . Frequency of Social Gatherings with Friends and Family: Not on file  . Attends Religious Services: Not on file  . Active Member of Clubs or Organizations: Not on file  . Attends Archivist Meetings: Not on file  . Marital Status: Not on file  Intimate Partner Violence:   . Fear of Current or Ex-Partner: Not on file  . Emotionally Abused: Not on file  . Physically Abused: Not on file  . Sexually Abused: Not on file   Health Maintenance  Topic Date Due  . HIV Screening  09/14/1997  . TETANUS/TDAP  09/14/2001  . HEMOGLOBIN A1C   03/22/2019  . INFLUENZA VACCINE  01/24/2020 (Originally 05/27/2019)  . OPHTHALMOLOGY EXAM  12/03/2020  . PAP SMEAR-Modifier  12/08/2020  . FOOT EXAM  12/18/2020  . PNEUMOCOCCAL POLYSACCHARIDE VACCINE AGE 77-64 HIGH RISK  Completed    The following portions of the patient's history were reviewed and updated as appropriate:  She  has a past medical history of Depression, Diabetes mellitus, Hypothyroid, and Obesity. She does not have any pertinent problems on file. She  has a past surgical history that includes Cholecystectomy. Her family history includes Anemia in her mother; Arthritis in her mother; Breast cancer in her maternal grandmother, mother, and another family member; Diabetes in her maternal grandmother and another family member; Hypertension in an other family member; Stroke in an other family member. She  reports that she has never smoked. She has never used smokeless tobacco. She reports current alcohol use. She reports that she does not use drugs. She has a current medication list which includes the following prescription(s): alprazolam, b complex vitamins, glucose blood, levothyroxine, losartan, metformin, onetouch delica lancets fine, vitamin c, canagliflozin, fluoxetine, montelukast, and vitamin d (cholecalciferol). Current Outpatient Medications on File Prior to Visit  Medication Sig Dispense Refill  .  b complex vitamins tablet Take 1 tablet by mouth daily. Reported on 12/18/2015    . glucose blood (ONETOUCH VERIO) test strip Use to test blood sugar 2 times daily as instructed. 100 each 11  . ONETOUCH DELICA LANCETS FINE MISC Use to test blood sugar 2 times daily as instructed. 100 each 11  . Ascorbic Acid (VITAMIN C) 500 MG CAPS Take 1 capsule by mouth daily. Reported on 12/18/2015    . canagliflozin (INVOKANA) 100 MG TABS tablet Take 1 tablet (100 mg total) by mouth daily. (Patient not taking: Reported on 12/19/2019) 30 tablet 2  . montelukast (SINGULAIR) 10 MG tablet Take 1 tablet  (10 mg total) by mouth at bedtime. (Patient not taking: Reported on 12/19/2019) 30 tablet 3  . Vitamin D, Cholecalciferol, 1000 UNITS CAPS Take 2 capsules by mouth daily. Reported on 12/02/2015     No current facility-administered medications on file prior to visit.   She has No Known Allergies..  Review of Systems Review of Systems  Constitutional: Negative for activity change, appetite change and fatigue.  HENT: Negative for hearing loss, congestion, tinnitus and ear discharge.  dentist q63m Eyes: Negative for visual disturbance (see optho q1y -- vision corrected to 20/20 with glasses).  Respiratory: Negative for cough, chest tightness and shortness of breath.   Cardiovascular: Negative for chest pain, palpitations and leg swelling.  Gastrointestinal: Negative for abdominal pain, diarrhea, constipation and abdominal distention.  Genitourinary: Negative for urgency, frequency, decreased urine volume and difficulty urinating.  Musculoskeletal: Negative for back pain, arthralgias and gait problem.  Skin: Negative for color change, pallor and rash.  Neurological: Negative for dizziness, light-headedness, numbness and headaches.  Hematological: Negative for adenopathy. Does not bruise/bleed easily.  Psychiatric/Behavioral: Negative for suicidal ideas, confusion, sleep disturbance, self-injury, dysphoric mood, decreased concentration and agitation.       Objective:    BP 130/80 (BP Location: Left Arm, Patient Position: Sitting, Cuff Size: Large)   Pulse (!) 111   Temp (!) 97.4 F (36.3 C) (Temporal)   Resp 18   Ht 5\' 3"  (1.6 m)   Wt 283 lb 12.8 oz (128.7 kg)   SpO2 96%   BMI 50.27 kg/m  General appearance: alert, cooperative, appears stated age, no distress and morbidly obese Head: Normocephalic, without obvious abnormality, atraumatic Eyes: conjunctivae/corneas clear. PERRL, EOM's intact. Fundi benign. Ears: normal TM's and external ear canals both ears Neck: no adenopathy, no  carotid bruit, no JVD, supple, symmetrical, trachea midline and thyroid not enlarged, symmetric, no tenderness/mass/nodules Back: symmetric, no curvature. ROM normal. No CVA tenderness. Lungs: clear to auscultation bilaterally Breasts: normal appearance, no masses or tenderness Heart: regular rate and rhythm, S1, S2 normal, no murmur, click, rub or gallop Abdomen: soft, non-tender; bowel sounds normal; no masses,  no organomegaly Pelvic: deferred--gyn Extremities: extremities normal, atraumatic, no cyanosis or edema Pulses: 2+ and symmetric Skin: Skin color, texture, turgor normal. No rashes or lesions Lymph nodes: Cervical, supraclavicular, and axillary nodes normal. Neurologic: Alert and oriented X 3, normal strength and tone. Normal symmetric reflexes. Normal coordination and gait    Assessment:    Healthy female exam.      Plan:     ghm utd Check labs See After Visit Summary for Counseling Recommendations    1. Anxiety Worsening  Inc prozac and f/u 1 month - ALPRAZolam (XANAX) 0.5 MG tablet; TAKE 1 TABLET(0.5 MG) BY MOUTH TWICE DAILY AS NEEDED FOR ANXIETY  Dispense: 60 tablet; Refill: 1 - FLUoxetine (PROZAC) 40 MG capsule;  Take 1 capsule (40 mg total) by mouth daily.  Dispense: 90 capsule; Refill: 3  2. DM (diabetes mellitus) type II uncontrolled, periph vascular disorder ([place]) Check labs Refer to endo  - metFORMIN (GLUCOPHAGE) 1000 MG tablet; Take 1 tablet (1,000 mg total) by mouth 2 (two) times daily with a meal.  Dispense: 180 tablet; Refill: 2 - Comprehensive metabolic panel - Microalbumin / creatinine urine ratio - Hemoglobin A1c  3. Essential hypertension Well controlled, no changes to meds. Encouraged heart healthy diet such as the DASH diet and exercise as tolerated.  - losartan (COZAAR) 25 MG tablet; TAKE 1 TABLET(25 MG) BY MOUTH DAILY  Dispense: 90 tablet; Refill: 1  4. Hypothyroidism, unspecified type Refer to endo  - levothyroxine (SYNTHROID) 300 MCG tablet;  Take 1 tablet (300 mcg total) by mouth daily.  Dispense: 90 tablet; Refill: 2 - Ambulatory referral to Endocrinology - Thyroid Panel With TSH  5. Uncontrolled type 2 diabetes mellitus with hyperglycemia ([place])  - Ambulatory referral to Endocrinology - Hemoglobin A1c  6. Preventative health care See above  - Lipid panel - CBC with Differential/Platelet - Comprehensive metabolic panel - Microalbumin / creatinine urine ratio - Hemoglobin A1c - Thyroid Panel With TSH  7. Hyperlipidemia associated with type 2 diabetes mellitus ([place]) Tolerating statin, encouraged heart healthy diet, avoid trans fats, minimize simple carbs and saturated fats. Increase exercise as tolerated - Lipid panel - Comprehensive metabolic panel  8. Morbid obesity ([place])   - Amb Ref to Medical Weight Management  9. Breast pain, left   - MM Digital Diagnostic Bilat; Future - US BREAST COMPLETE UNI LEFT INC AXILLA; Future  10. Family history of breast cancer   - MM Digital Diagnostic Bilat; Future - US BREAST COMPLETE UNI LEFT INC AXILLA; Future  11. Need for pneumococcal vaccination   - Pneumococcal polysaccharide vaccine 23-valent greater than or equal to 2yo subcutaneous/IM

## 2019-12-19 NOTE — Assessment & Plan Note (Signed)
Refer to endo at pt request

## 2019-12-20 LAB — HEMOGLOBIN A1C
Hgb A1c MFr Bld: 6.9 % of total Hgb — ABNORMAL HIGH (ref <5.7)
Mean Plasma Glucose: 151 (calc)
eAG (mmol/L): 8.4 (calc)

## 2019-12-20 LAB — THYROID PANEL WITH TSH
Free Thyroxine Index: 1.5 (ref 1.4–3.8)
T3 Uptake: 28 % (ref 22–35)
T4, Total: 5.5 ug/dL (ref 5.1–11.9)
TSH: 31.33 mIU/L — ABNORMAL HIGH

## 2019-12-21 ENCOUNTER — Other Ambulatory Visit: Payer: Self-pay

## 2019-12-21 DIAGNOSIS — D72829 Elevated white blood cell count, unspecified: Secondary | ICD-10-CM

## 2020-01-03 ENCOUNTER — Ambulatory Visit: Payer: 59 | Admitting: Psychology

## 2020-01-16 ENCOUNTER — Ambulatory Visit (INDEPENDENT_AMBULATORY_CARE_PROVIDER_SITE_OTHER): Payer: 59 | Admitting: Family Medicine

## 2020-01-16 ENCOUNTER — Encounter: Payer: Self-pay | Admitting: Family Medicine

## 2020-01-16 ENCOUNTER — Other Ambulatory Visit: Payer: Self-pay

## 2020-01-16 VITALS — Temp 98.1°F

## 2020-01-16 DIAGNOSIS — F419 Anxiety disorder, unspecified: Secondary | ICD-10-CM | POA: Diagnosis not present

## 2020-01-16 NOTE — Assessment & Plan Note (Signed)
con't prozac and xanax  F/u

## 2020-01-16 NOTE — Patient Instructions (Signed)

## 2020-01-16 NOTE — Progress Notes (Signed)
Virtual Visit via Video Note  I connected with Joanne Harris on 01/16/20 at  2:00 PM EDT by a video enabled telemedicine application and verified that I am speaking with the correct person using two identifiers.  Location: Patient: home alone Provider: office    I discussed the limitations of evaluation and management by telemedicine and the availability of in person appointments. The patient expressed understanding and agreed to proceed.  History of Present Illness: Pt at home for f/u anxiety.  She is doing well with the increased dose of prozac.  And prn xanax.  No complaints.     Observations/Objective: Vitals:   01/16/20 1358  Temp: 98.1 F (36.7 C)   Pt is in nad Pt mood is good She is not suicidal  Doing well with 40 mg prozac  Assessment and Plan: 1. Anxiety con't prozac and xanax  F/u 3 months   Follow Up Instructions:    I discussed the assessment and treatment plan with the patient. The patient was provided an opportunity to ask questions and all were answered. The patient agreed with the plan and demonstrated an understanding of the instructions.   The patient was advised to call back or seek an in-person evaluation if the symptoms worsen or if the condition fails to improve as anticipated.  I provided 25 minutes of non-face-to-face time during this encounter.   Ann Held, DO

## 2020-01-18 ENCOUNTER — Ambulatory Visit: Payer: 59 | Admitting: Psychology

## 2020-01-24 ENCOUNTER — Other Ambulatory Visit: Payer: Self-pay | Admitting: Family

## 2020-01-24 DIAGNOSIS — D469 Myelodysplastic syndrome, unspecified: Secondary | ICD-10-CM

## 2020-01-24 DIAGNOSIS — D72829 Elevated white blood cell count, unspecified: Secondary | ICD-10-CM

## 2020-01-24 DIAGNOSIS — D508 Other iron deficiency anemias: Secondary | ICD-10-CM

## 2020-01-25 ENCOUNTER — Inpatient Hospital Stay: Payer: 59

## 2020-01-25 ENCOUNTER — Inpatient Hospital Stay: Payer: 59 | Attending: Family | Admitting: Family

## 2020-01-25 ENCOUNTER — Other Ambulatory Visit: Payer: Self-pay

## 2020-01-25 ENCOUNTER — Encounter: Payer: Self-pay | Admitting: Family

## 2020-01-25 ENCOUNTER — Telehealth: Payer: Self-pay | Admitting: Family

## 2020-01-25 DIAGNOSIS — D75839 Thrombocytosis, unspecified: Secondary | ICD-10-CM | POA: Insufficient documentation

## 2020-01-25 DIAGNOSIS — D473 Essential (hemorrhagic) thrombocythemia: Secondary | ICD-10-CM

## 2020-01-25 DIAGNOSIS — D508 Other iron deficiency anemias: Secondary | ICD-10-CM

## 2020-01-25 DIAGNOSIS — D72829 Elevated white blood cell count, unspecified: Secondary | ICD-10-CM

## 2020-01-25 DIAGNOSIS — Z9049 Acquired absence of other specified parts of digestive tract: Secondary | ICD-10-CM | POA: Diagnosis not present

## 2020-01-25 DIAGNOSIS — Z803 Family history of malignant neoplasm of breast: Secondary | ICD-10-CM | POA: Diagnosis not present

## 2020-01-25 DIAGNOSIS — D72823 Leukemoid reaction: Secondary | ICD-10-CM

## 2020-01-25 DIAGNOSIS — D469 Myelodysplastic syndrome, unspecified: Secondary | ICD-10-CM

## 2020-01-25 HISTORY — DX: Thrombocytosis, unspecified: D75.839

## 2020-01-25 HISTORY — DX: Essential (hemorrhagic) thrombocythemia: D47.3

## 2020-01-25 HISTORY — DX: Elevated white blood cell count, unspecified: D72.829

## 2020-01-25 LAB — CBC WITH DIFFERENTIAL (CANCER CENTER ONLY)
Abs Immature Granulocytes: 0.04 10*3/uL (ref 0.00–0.07)
Basophils Absolute: 0 10*3/uL (ref 0.0–0.1)
Basophils Relative: 0 %
Eosinophils Absolute: 0.1 10*3/uL (ref 0.0–0.5)
Eosinophils Relative: 1 %
HCT: 39.5 % (ref 36.0–46.0)
Hemoglobin: 13.2 g/dL (ref 12.0–15.0)
Immature Granulocytes: 0 %
Lymphocytes Relative: 21 %
Lymphs Abs: 2.7 10*3/uL (ref 0.7–4.0)
MCH: 31.7 pg (ref 26.0–34.0)
MCHC: 33.4 g/dL (ref 30.0–36.0)
MCV: 95 fL (ref 80.0–100.0)
Monocytes Absolute: 0.8 10*3/uL (ref 0.1–1.0)
Monocytes Relative: 6 %
Neutro Abs: 8.9 10*3/uL — ABNORMAL HIGH (ref 1.7–7.7)
Neutrophils Relative %: 72 %
Platelet Count: 537 10*3/uL — ABNORMAL HIGH (ref 150–400)
RBC: 4.16 MIL/uL (ref 3.87–5.11)
RDW: 11.6 % (ref 11.5–15.5)
WBC Count: 12.6 10*3/uL — ABNORMAL HIGH (ref 4.0–10.5)
nRBC: 0 % (ref 0.0–0.2)

## 2020-01-25 LAB — RETICULOCYTES
Immature Retic Fract: 15 % (ref 2.3–15.9)
RBC.: 4.09 MIL/uL (ref 3.87–5.11)
Retic Count, Absolute: 92.8 10*3/uL (ref 19.0–186.0)
Retic Ct Pct: 2.3 % (ref 0.4–3.1)

## 2020-01-25 LAB — CMP (CANCER CENTER ONLY)
ALT: 32 U/L (ref 0–44)
AST: 19 U/L (ref 15–41)
Albumin: 4.5 g/dL (ref 3.5–5.0)
Alkaline Phosphatase: 62 U/L (ref 38–126)
Anion gap: 8 (ref 5–15)
BUN: 10 mg/dL (ref 6–20)
CO2: 28 mmol/L (ref 22–32)
Calcium: 9.8 mg/dL (ref 8.9–10.3)
Chloride: 101 mmol/L (ref 98–111)
Creatinine: 0.6 mg/dL (ref 0.44–1.00)
GFR, Est AFR Am: >60 mL/min (ref >60)
GFR, Estimated: >60 mL/min (ref >60)
Glucose, Bld: 133 mg/dL — ABNORMAL HIGH (ref 70–99)
Potassium: 4.1 mmol/L (ref 3.5–5.1)
Sodium: 137 mmol/L (ref 135–145)
Total Bilirubin: 0.5 mg/dL (ref 0.3–1.2)
Total Protein: 8 g/dL (ref 6.5–8.1)

## 2020-01-25 LAB — LACTATE DEHYDROGENASE: LDH: 151 U/L (ref 98–192)

## 2020-01-25 LAB — SAVE SMEAR(SSMR), FOR PROVIDER SLIDE REVIEW

## 2020-01-25 NOTE — Progress Notes (Signed)
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 WBC count is 12.6 and platelets 537. Hgb 13.2 and MCV 95.  She is symptomatic with fatigue, night sweats (requiring clothing changes) and hot flashes.   She is hydrating well but states that she has not had much of an appetite.  She has night time palpitations that she feels may be due to anxiety.  She states that she has had iron deficiency in the past and has taken an oral supplement when needed. She is not currently taking one at this time.  She has an IUD in place and only occasionally spots. No other bleeding noted.  No abnormal bruising, no petechiae.  No sickle cell disease or trait that she is aware of.  No personal history of cancer. Family history includes: maternal grandmother - breast, mother - breast and dad - prostate.  No fever, chills, n/v, cough, rash, dizziness, SOB, chest pain and abdominal pain. She has noted some issues with delayed emptying with her bladder. No pain with this.  She notes diarrhea with greasy foods and possibly Metformin. She has had her gallbladder removed.  She states that her blood sugars are well controlled.  She has hypothyroidism and states that she is doing well on Synthroid.  She had a cataract removed from her right eye last week and will be having the left eye cataract removed next week.  No swelling, tenderness, numbness or tingling in her extremities.  She will sometimes walk with a friend for exercise and cleans her home daily. She does not smoke or use recreational drugs. She does have the occasional  alcoholic beverage socially.  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   ROS: All other 10 point review of systems is negative.   PAST MEDICAL HISTORY:   Past Medical History:  Diagnosis Date  . Depression   . Diabetes mellitus   . Hypothyroid   . Obesity     ALLERGIES: No Known Allergies    MEDICATIONS:  Current Outpatient Medications on File Prior to Visit  Medication Sig Dispense Refill  . ALPRAZolam (XANAX) 0.5 MG tablet TAKE 1 TABLET(0.5 MG) BY MOUTH TWICE DAILY AS NEEDED FOR ANXIETY 60 tablet 1  . b complex vitamins tablet Take 1 tablet by mouth daily. Reported on 12/18/2015    . FLUoxetine (PROZAC) 40 MG capsule Take 1 capsule (40 mg total) by mouth daily. 90 capsule 3  . glucose blood (ONETOUCH VERIO) test strip Use to test blood sugar 2 times daily as instructed. 100 each 11  . levothyroxine (SYNTHROID) 300 MCG tablet Take 1 tablet (300 mcg total) by mouth daily. 90 tablet 2  . losartan (COZAAR) 25 MG tablet TAKE 1 TABLET(25 MG) BY MOUTH DAILY 90 tablet 1  . metFORMIN (GLUCOPHAGE) 1000 MG tablet Take 1 tablet (1,000 mg total) by mouth 2 (two) times daily with a meal. 180 tablet 2  . ONETOUCH DELICA LANCETS FINE MISC Use to test blood sugar 2 times daily as instructed. 100 each 11  . Vitamin D, Cholecalciferol, 1000 UNITS CAPS Take 2 capsules by  mouth daily. Reported on 12/02/2015    . Ascorbic Acid (VITAMIN C) 500 MG CAPS Take 1 capsule by mouth daily. Reported on 12/18/2015     No current facility-administered medications on file prior to visit.     PAST SURGICAL HISTORY Past Surgical History:  Procedure Laterality Date  . CHOLECYSTECTOMY      FAMILY HISTORY: Family History  Problem Relation Age of Onset  . Anemia Mother   . Breast cancer Mother   . Arthritis Mother   . Hypertension Other        grandmother  . Diabetes Other        grandmother  . Stroke Other        grandfather  . Breast cancer Other        grandmother, late 71s  . Diabetes  Maternal Grandmother   . Breast cancer Maternal Grandmother     SOCIAL HISTORY:  reports that she has never smoked. She has never used smokeless tobacco. She reports current alcohol use. She reports that she does not use drugs.  PERFORMANCE STATUS: The patient's performance status is 1 - Symptomatic but completely ambulatory  PHYSICAL EXAM: Most Recent Vital Signs: Blood pressure (!) 143/93, pulse (!) 104, temperature (!) 97.1 F (36.2 C), temperature source Temporal, resp. rate 19, height '5\' 3"'  (1.6 m), weight 284 lb (128.8 kg), SpO2 100 %. BP (!) 143/93 (BP Location: Left Arm, Patient Position: Sitting)   Pulse (!) 104   Temp (!) 97.1 F (36.2 C) (Temporal)   Resp 19   Ht '5\' 3"'  (1.6 m)   Wt 284 lb (128.8 kg)   SpO2 100%   BMI 50.31 kg/m   General Appearance:    Alert, cooperative, no distress, appears stated age  Head:    Normocephalic, without obvious abnormality, atraumatic  Eyes:    PERRL, conjunctiva/corneas clear, EOM's intact, fundi    benign, both eyes        Throat:   Lips, mucosa, and tongue normal; teeth and gums normal  Neck:   Supple, symmetrical, trachea midline, no adenopathy;    thyroid:  no enlargement/tenderness/nodules; no carotid   bruit or JVD  Back:     Symmetric, no curvature, ROM normal, no CVA tenderness  Lungs:     Clear to auscultation bilaterally, respirations unlabored  Chest Wall:    No tenderness or deformity   Heart:    Regular rate and rhythm, S1 and S2 normal, no murmur, rub   or gallop     Abdomen:     Soft, non-tender, bowel sounds active all four quadrants,    no masses, no organomegaly        Extremities:   Extremities normal, atraumatic, no cyanosis or edema  Pulses:   2+ and symmetric all extremities  Skin:   Skin color, texture, turgor normal, no rashes or lesions  Lymph nodes:   Cervical, supraclavicular, and axillary nodes normal  Neurologic:   CNII-XII intact, normal strength, sensation and reflexes    throughout     LABORATORY DATA:  Results for orders placed or performed in visit on 01/25/20 (from the past 48 hour(s))  Save Smear (SSMR)     Status: None   Collection Time: 01/25/20 10:28 AM  Result Value Ref Range   Smear Review SMEAR STAINED AND AVAILABLE FOR REVIEW     Comment: Performed at [facility] at [facility], [address], [place], [place] [address]  CBC with Differential ([place]  Only)     Status: Abnormal   Collection Time: 01/25/20 10:28 AM  Result Value Ref Range   WBC Count 12.6 (H) 4.0 - 10.5 K/uL   RBC 4.16 3.87 - 5.11 MIL/uL   Hemoglobin 13.2 12.0 - 15.0 g/dL   HCT 39.5 36.0 - 46.0 %   MCV 95.0 80.0 - 100.0 fL   MCH 31.7 26.0 - 34.0 pg   MCHC 33.4 30.0 - 36.0 g/dL   RDW 11.6 11.5 - 15.5 %   Platelet Count 537 (H) 150 - 400 K/uL   nRBC 0.0 0.0 - 0.2 %   Neutrophils Relative % 72 %   Neutro Abs 8.9 (H) 1.7 - 7.7 K/uL   Lymphocytes Relative 21 %   Lymphs Abs 2.7 0.7 - 4.0 K/uL   Monocytes Relative 6 %   Monocytes Absolute 0.8 0.1 - 1.0 K/uL   Eosinophils Relative 1 %   Eosinophils Absolute 0.1 0.0 - 0.5 K/uL   Basophils Relative 0 %   Basophils Absolute 0.0 0.0 - 0.1 K/uL   Immature Granulocytes 0 %   Abs Immature Granulocytes 0.04 0.00 - 0.07 K/uL    Comment: Performed at [facility] at [facility], [address], [place], [place] [address]  Reticulocytes     Status: None   Collection Time: 01/25/20 10:28 AM  Result Value Ref Range   Retic Ct Pct 2.3 0.4 - 3.1 %   RBC. 4.09 3.87 - 5.11 MIL/uL   Retic Count, Absolute 92.8 19.0 - 186.0 K/uL   Immature Retic Fract 15.0 2.3 - 15.9 %    Comment: Performed at [facility] at [facility], [address], [place], [place] [address]      RADIOGRAPHY: No results found.     PATHOLOGY: None   ASSESSMENT/PLAN: Ms. Scullin is a very pleasant 39 yo African American female with mild leukocytosis as well as thrombocytosis off and on  over the last 10 years. She is asymptomatic with the leukocytosis. No issues with infections.  She has history of iron deficiency in the past which may possibly be related to the thrombocytosis. We will see what her counts look like.  JAK 2 Genpath pending. We will contact her to go over results once they are all available.  We will plan to see her back in another 5 months.   All questions were answered and she is inagreement . She will contact our office with any questions or concerns. She will contact our office with any questions or concerns. We can certainly see her sooner if needed.   She was discussed with and also seen by Dr. Marin Olp and he is in agreement with the aforementioned.   Laverna Peace, NP     Addendum:   I saw and examined Ms. Eisen with Judson Roch.  I agree with the above assessment.  I looked at her blood smear under the microscope.  She did have a slight increase in white blood cells.  There appeared mature.  I saw no immature myeloid or lymphoid cells.  Her platelets were quite increased in number.  She had a couple large platelets.  Platelets were well granulated.  Her red blood cells showed no nucleated red blood cells.  Is hard to say why she has the leukocytosis and thrombocytosis.  I suppose there might be the possibility of a myeloproliferative neoplasm.  I think the only way we would really know this would be with  a bone marrow biopsy.  However, a JAK2 assay would be reasonable.  I do not think that she has chronic myeloid leukemia.  I really do not see immature myeloid cells that look like a chronic leukemic situation.  I would be surprised if she is iron deficient given that her MCV is 95.  I know she is not had any type of abdominal imaging to look for splenomegaly.  This might be something to think about.  She looks quite good.  She really is not bothered by the blood counts being on the high side.  We will just have to await the results of our lab work  and then see how we need to proceed with our evaluation.  Ms. Mclester is very very nice.  It was fun talking to her.  We spent about 45 minutes with her.  We went over the lab work.  We answered her questions.  We will plan to get her back in about 4-5 months.  Lattie Haw, MD

## 2020-01-25 NOTE — Telephone Encounter (Signed)
Appointments scheduled calendar declined due to My Chart per 4/1 los

## 2020-01-26 LAB — IRON AND TIBC
Iron: 78 ug/dL (ref 41–142)
Saturation Ratios: 20 % — ABNORMAL LOW (ref 21–57)
TIBC: 396 ug/dL (ref 236–444)
UIBC: 318 ug/dL (ref 120–384)

## 2020-01-26 LAB — FERRITIN: Ferritin: 58 ng/mL (ref 11–307)

## 2020-01-31 ENCOUNTER — Ambulatory Visit: Payer: 59 | Admitting: Internal Medicine

## 2020-02-01 ENCOUNTER — Other Ambulatory Visit: Payer: Self-pay | Admitting: Family

## 2020-02-01 ENCOUNTER — Telehealth: Payer: Self-pay | Admitting: Family

## 2020-02-01 DIAGNOSIS — D75839 Thrombocytosis, unspecified: Secondary | ICD-10-CM

## 2020-02-01 DIAGNOSIS — D508 Other iron deficiency anemias: Secondary | ICD-10-CM

## 2020-02-01 DIAGNOSIS — D72823 Leukemoid reaction: Secondary | ICD-10-CM

## 2020-02-01 DIAGNOSIS — D473 Essential (hemorrhagic) thrombocythemia: Secondary | ICD-10-CM

## 2020-02-01 NOTE — Telephone Encounter (Signed)
I was able to speak with [name] and go over her recent lab results with her. Her JAK2 genpath came back norma.  She was a little low with an iron saturation of 20%. I did recommend she try taking gentle iron every other day and see if this improves by her next visit. She agreed. No other questions at this time.

## 2020-02-09 ENCOUNTER — Other Ambulatory Visit: Payer: Self-pay | Admitting: Family Medicine

## 2020-02-09 ENCOUNTER — Encounter: Payer: Self-pay | Admitting: Family Medicine

## 2020-02-09 DIAGNOSIS — F419 Anxiety disorder, unspecified: Secondary | ICD-10-CM

## 2020-02-09 LAB — JAK 2 V617F (GENPATH)

## 2020-02-09 MED ORDER — ALPRAZOLAM 0.5 MG PO TABS: ORAL_TABLET | ORAL | 1 refills | Status: DC

## 2020-02-09 NOTE — Telephone Encounter (Signed)
It was sent in  

## 2020-02-13 ENCOUNTER — Encounter (INDEPENDENT_AMBULATORY_CARE_PROVIDER_SITE_OTHER): Payer: Self-pay

## 2020-02-14 ENCOUNTER — Ambulatory Visit
Admission: RE | Admit: 2020-02-14 | Discharge: 2020-02-14 | Disposition: A | Discharge status: Home / Self Care | Payer: 59 | Source: Ambulatory Visit | Attending: Family Medicine | Admitting: Family Medicine

## 2020-02-14 ENCOUNTER — Other Ambulatory Visit: Payer: Self-pay

## 2020-02-14 ENCOUNTER — Ambulatory Visit: Payer: 59

## 2020-02-14 DIAGNOSIS — N644 Mastodynia: Secondary | ICD-10-CM

## 2020-02-14 DIAGNOSIS — Z803 Family history of malignant neoplasm of breast: Secondary | ICD-10-CM

## 2020-02-20 ENCOUNTER — Ambulatory Visit (INDEPENDENT_AMBULATORY_CARE_PROVIDER_SITE_OTHER): Payer: Self-pay | Admitting: Family Medicine

## 2020-02-27 ENCOUNTER — Ambulatory Visit (INDEPENDENT_AMBULATORY_CARE_PROVIDER_SITE_OTHER): Payer: 59 | Admitting: Internal Medicine

## 2020-02-27 ENCOUNTER — Encounter: Payer: Self-pay | Admitting: Internal Medicine

## 2020-02-27 ENCOUNTER — Other Ambulatory Visit: Payer: Self-pay

## 2020-02-27 VITALS — BP 142/88 | HR 96 | Temp 98.5°F | Ht 63.0 in | Wt 290.6 lb

## 2020-02-27 DIAGNOSIS — E890 Postprocedural hypothyroidism: Secondary | ICD-10-CM | POA: Diagnosis not present

## 2020-02-27 LAB — TSH: TSH: 0.09 u[IU]/mL — ABNORMAL LOW (ref 0.35–4.50)

## 2020-02-27 LAB — T4, FREE: Free T4: 1.88 ng/dL — ABNORMAL HIGH (ref 0.60–1.60)

## 2020-02-27 NOTE — Patient Instructions (Signed)

## 2020-02-27 NOTE — Progress Notes (Signed)
Name: Joanne Harris  MRN/ DOB: [id number], [birth year]    Age/ Sex: 39 y.o., female    PCP: Ann Held, DO   Reason for Endocrinology Evaluation: Hypothyroidism     Date of Initial Endocrinology Evaluation: 02/27/2020     HPI: Joanne Harris is a 39 y.o. female with a past medical history of T2DM, HTN and hypothyroidism. The patient presented for initial endocrinology clinic visit on 02/27/2020 for consultative assistance with her Hypothyroidism.   Pt has been diagnosed with hyperthyroidism in 2002, she is S/P RAI ablation followed by LT- 4 replacement .   Pt admits to non-compliance in the past with LT-4 replacement, but has been taking it better in the recent months. Takes it in the morning on an empty stomach. She does not take MVI but takes magnesium .She is not on PPI.    Energy level is stable  She denies constipation  Has depression , as well as hair loss   Denies local neck symptoms    Grandmother with thyroid disease   HISTORY:  Past Medical History:  Past Medical History:  Diagnosis Date  . Depression   . Diabetes mellitus   . Hypothyroid   . Leukocytosis 01/25/2020  . Obesity   . Thrombocytosis ([place]) 01/25/2020   Past Surgical History:  Past Surgical History:  Procedure Laterality Date  . CHOLECYSTECTOMY        Social History:  reports that she has never smoked. She has never used smokeless tobacco. She reports current alcohol use. She reports that she does not use drugs.  Family History: family history includes Anemia in her mother; Arthritis in her mother; Breast cancer in her maternal grandmother, mother, and another family member; Diabetes in her maternal grandmother and another family member; Hypertension in an other family member; Stroke in an other family member.   HOME MEDICATIONS: Allergies as of 02/27/2020   No Known Allergies     Medication List       Accurate as of Feb 27, 2020 11:47 AM. If you have any questions, ask  your nurse or doctor.        ALPRAZolam 0.5 MG tablet Commonly known as: XANAX TAKE 1 TABLET(0.5 MG) BY MOUTH TWICE DAILY AS NEEDED FOR ANXIETY   b complex vitamins tablet Take 1 tablet by mouth daily. Reported on 12/18/2015   FLUoxetine 40 MG capsule Commonly known as: PROZAC Take 1 capsule (40 mg total) by mouth daily.   glucose blood test strip Commonly known as: OneTouch Verio Use to test blood sugar 2 times daily as instructed.   levothyroxine 300 MCG tablet Commonly known as: Synthroid Take 1 tablet (300 mcg total) by mouth daily.   losartan 25 MG tablet Commonly known as: COZAAR TAKE 1 TABLET(25 MG) BY MOUTH DAILY   metFORMIN 1000 MG tablet Commonly known as: GLUCOPHAGE Take 1 tablet (1,000 mg total) by mouth 2 (two) times daily with a meal.   OneTouch Delica Lancets Fine Misc Use to test blood sugar 2 times daily as instructed.   Vitamin C 500 MG Caps Take 1 capsule by mouth daily. Reported on 12/18/2015   Vitamin D (Cholecalciferol) 25 MCG (1000 UT) Caps Take 2 capsules by mouth daily. Reported on 12/02/2015         REVIEW OF SYSTEMS: A comprehensive ROS was conducted with the patient and is negative except as per HPI    OBJECTIVE:  VS: BP (!) 142/88 (BP Location: Left Arm, Patient Position:  Sitting, Cuff Size: Large)   Pulse 96   Temp 98.5 F (36.9 C)   Ht 5\' 3"  (1.6 m)   Wt 290 lb 9.6 oz (131.8 kg)   SpO2 98%   BMI 51.48 kg/m    Wt Readings from Last 3 Encounters:  02/27/20 290 lb 9.6 oz (131.8 kg)  01/25/20 284 lb (128.8 kg)  12/19/19 283 lb 12.8 oz (128.7 kg)     EXAM: General: Pt appears well and is in NAD  Eyes: External eye exam normal with stare, and minimal exophthalmos.  EOM intact.    Neck: General: Supple without adenopathy. Thyroid: Thyroid size normal.  No goiter or nodules appreciated. No thyroid bruit.  Lungs: Clear with good BS bilat with no rales, rhonchi, or wheezes  Heart: Auscultation: RRR.  Abdomen: Normoactive bowel  sounds, soft, nontender, without masses or organomegaly palpable  Extremities:  BL LE: No pretibial edema normal ROM and strength.  Skin: Hair: Texture and amount normal with gender appropriate distribution Skin Inspection: No rashes Skin Palpation: Skin temperature, texture, and thickness normal to palpation  Neuro: Cranial nerves: II - XII grossly intact  Motor: Normal strength throughout DTRs: 2+ and symmetric in UE without delay in relaxation phase  Mental Status: Judgment, insight: Intact Orientation: Oriented to time, place, and person Mood and affect: No depression, anxiety, or agitation     DATA REVIEWED: Results for JAVONNA, GRAPES (MRN [id number]) as of 02/28/2020 10:40  Ref. Range 02/27/2020 11:31  TSH Latest Ref Range: 0.35 - 4.50 uIU/mL 0.09 (L)  T4,Free(Direct) Latest Ref Range: 0.60 - 1.60 ng/dL 1.88 (H)    ASSESSMENT/PLAN/RECOMMENDATIONS:   1. Postablative Hypothyroidism:  - No local neck symptoms -Pt educated extensively on the correct way to take levothyroxine (first thing in the morning with water, 30 minutes before eating or taking other medications). - Pt encouraged to double dose the following day if she were to miss a dose given long half-life of levothyroxine. - We discussed the importance of compliance, we discussed the risk of myxedema coma with non-compliance    Medications : Levothyroxine 300 mcg daily EXCEPT sundays  F/U in 4 months   Signed electronically by: Mack Guise, MD  [facility]  [facility] [address] [place], [place] [address] Phone: [phone] FAX: [phone]   CC: Ann Held, DO [address] [place] [address] Phone: [phone] Fax: [phone]   Return to Endocrinology clinic as below: Future Appointments  Date Time Provider [place]  03/12/2020  5:00 PM Bauert, Nicolasa Ducking, LCSW [facility] None  03/26/2020  8:00 AM Ann Held, DO [facility]  04/24/2020  3:45 PM [facility] None  06/26/2020 10:15 AM [facility] None  06/26/2020 10:45 AM [place], Holli Humbles, NP [facility] None  07/02/2020  9:10 AM Jermiah Soderman, Melanie Crazier, MD [facility]

## 2020-03-05 ENCOUNTER — Ambulatory Visit (INDEPENDENT_AMBULATORY_CARE_PROVIDER_SITE_OTHER): Payer: Self-pay | Admitting: Family Medicine

## 2020-03-12 ENCOUNTER — Ambulatory Visit (INDEPENDENT_AMBULATORY_CARE_PROVIDER_SITE_OTHER): Payer: 59 | Admitting: Psychology

## 2020-03-12 DIAGNOSIS — F411 Generalized anxiety disorder: Secondary | ICD-10-CM

## 2020-03-26 ENCOUNTER — Encounter: Payer: Self-pay | Admitting: Family Medicine

## 2020-03-26 ENCOUNTER — Other Ambulatory Visit: Payer: Self-pay

## 2020-03-26 ENCOUNTER — Ambulatory Visit (INDEPENDENT_AMBULATORY_CARE_PROVIDER_SITE_OTHER): Payer: 59 | Admitting: Family Medicine

## 2020-03-26 VITALS — BP 130/90 | HR 95 | Temp 97.1°F | Resp 18 | Ht 63.0 in | Wt 287.4 lb

## 2020-03-26 DIAGNOSIS — F419 Anxiety disorder, unspecified: Secondary | ICD-10-CM | POA: Diagnosis not present

## 2020-03-26 DIAGNOSIS — E1151 Type 2 diabetes mellitus with diabetic peripheral angiopathy without gangrene: Secondary | ICD-10-CM | POA: Diagnosis not present

## 2020-03-26 DIAGNOSIS — IMO0002 Reserved for concepts with insufficient information to code with codable children: Secondary | ICD-10-CM

## 2020-03-26 DIAGNOSIS — R002 Palpitations: Secondary | ICD-10-CM | POA: Diagnosis not present

## 2020-03-26 DIAGNOSIS — E1165 Type 2 diabetes mellitus with hyperglycemia: Secondary | ICD-10-CM | POA: Diagnosis not present

## 2020-03-26 DIAGNOSIS — I10 Essential (primary) hypertension: Secondary | ICD-10-CM

## 2020-03-26 DIAGNOSIS — E785 Hyperlipidemia, unspecified: Secondary | ICD-10-CM

## 2020-03-26 LAB — LIPID PANEL
Cholesterol: 140 mg/dL (ref 0–200)
HDL: 44.2 mg/dL (ref >39.00)
LDL Cholesterol: 81 mg/dL (ref 0–99)
NonHDL: 96.18
Total CHOL/HDL Ratio: 3
Triglycerides: 77 mg/dL (ref 0.0–149.0)
VLDL: 15.4 mg/dL (ref 0.0–40.0)

## 2020-03-26 LAB — COMPREHENSIVE METABOLIC PANEL
ALT: 21 U/L (ref 0–35)
AST: 17 U/L (ref 0–37)
Albumin: 4.1 g/dL (ref 3.5–5.2)
Alkaline Phosphatase: 61 U/L (ref 39–117)
BUN: 8 mg/dL (ref 6–23)
CO2: 26 mEq/L (ref 19–32)
Calcium: 9.3 mg/dL (ref 8.4–10.5)
Chloride: 102 mEq/L (ref 96–112)
Creatinine, Ser: 0.48 mg/dL (ref 0.40–1.20)
GFR: 175.58 mL/min (ref >60.00)
Glucose, Bld: 152 mg/dL — ABNORMAL HIGH (ref 70–99)
Potassium: 3.8 mEq/L (ref 3.5–5.1)
Sodium: 136 mEq/L (ref 135–145)
Total Bilirubin: 0.4 mg/dL (ref 0.2–1.2)
Total Protein: 7 g/dL (ref 6.0–8.3)

## 2020-03-26 LAB — HEMOGLOBIN A1C: Hgb A1c MFr Bld: 8 % — ABNORMAL HIGH (ref 4.6–6.5)

## 2020-03-26 MED ORDER — FLUOXETINE HCL 20 MG PO TABS: ORAL_TABLET | ORAL | 3 refills | Status: DC

## 2020-03-26 NOTE — Progress Notes (Signed)
Patient ID: Joanne Harris, female    DOB: July 20, 1981  Age: 39 y.o. MRN: [id number]    Subjective:  Subjective  HPI Phillip Mcduff presents for f/u anxiety and chol.  She is seeing endo for thyroid and dm.   Last bs check 120 in the evening  He anxiety is worse and she states she was hyperthyroid on her last labs and her meds were adjusted.   Pt is in counseling  She does have palpitations , esp at night,   No cp, no sob     Review of Systems  Constitutional: Negative for appetite change, diaphoresis, fatigue and unexpected weight change.  Eyes: Negative for pain, redness and visual disturbance.  Respiratory: Negative for cough, chest tightness, shortness of breath and wheezing.   Cardiovascular: Positive for palpitations. Negative for chest pain and leg swelling.  Endocrine: Negative for cold intolerance, heat intolerance, polydipsia, polyphagia and polyuria.  Genitourinary: Negative for difficulty urinating, dysuria and frequency.  Neurological: Negative for dizziness, light-headedness, numbness and headaches.  Psychiatric/Behavioral: Negative for agitation, behavioral problems, confusion, decreased concentration, dysphoric mood, hallucinations, self-injury, sleep disturbance and suicidal ideas. The patient is nervous/anxious. The patient is not hyperactive.     History Past Medical History:  Diagnosis Date  . Depression   . Diabetes mellitus   . Hypothyroid   . Leukocytosis 01/25/2020  . Obesity   . Thrombocytosis ([place]) 01/25/2020    She has a past surgical history that includes Cholecystectomy.   Her family history includes Anemia in her mother; Arthritis in her mother; Breast cancer in her maternal grandmother, mother, and another family member; Diabetes in her maternal grandmother and another family member; Hypertension in an other family member; Stroke in an other family member.She reports that she has never smoked. She has never used smokeless tobacco. She reports  current alcohol use. She reports that she does not use drugs.  Current Outpatient Medications on File Prior to Visit  Medication Sig Dispense Refill  . ALPRAZolam (XANAX) 0.5 MG tablet TAKE 1 TABLET(0.5 MG) BY MOUTH TWICE DAILY AS NEEDED FOR ANXIETY 60 tablet 1  . b complex vitamins tablet Take 1 tablet by mouth daily. Reported on 12/18/2015    . FLUoxetine (PROZAC) 40 MG capsule Take 1 capsule (40 mg total) by mouth daily. 90 capsule 3  . glucose blood (ONETOUCH VERIO) test strip Use to test blood sugar 2 times daily as instructed. 100 each 11  . levothyroxine (SYNTHROID) 300 MCG tablet Take 1 tablet (300 mcg total) by mouth daily. 90 tablet 2  . losartan (COZAAR) 25 MG tablet TAKE 1 TABLET(25 MG) BY MOUTH DAILY 90 tablet 1  . metFORMIN (GLUCOPHAGE) 1000 MG tablet Take 1 tablet (1,000 mg total) by mouth 2 (two) times daily with a meal. 180 tablet 2  . ONETOUCH DELICA LANCETS FINE MISC Use to test blood sugar 2 times daily as instructed. 100 each 11  . Vitamin D, Cholecalciferol, 1000 UNITS CAPS Take 2 capsules by mouth daily. Reported on 12/02/2015     No current facility-administered medications on file prior to visit.     Objective:  Objective  Physical Exam Vitals and nursing note reviewed.  Constitutional:      Appearance: She is well-developed.  HENT:     Head: Normocephalic and atraumatic.  Eyes:     Conjunctiva/sclera: Conjunctivae normal.  Neck:     Thyroid: No thyromegaly.     Vascular: No carotid bruit or JVD.  Cardiovascular:     Rate  and Rhythm: Normal rate and regular rhythm.     Heart sounds: Normal heart sounds. No murmur.  Pulmonary:     Effort: Pulmonary effort is normal. No respiratory distress.     Breath sounds: Normal breath sounds. No wheezing or rales.  Chest:     Chest wall: No tenderness.  Musculoskeletal:     Cervical back: Normal range of motion and neck supple.  Neurological:     Mental Status: She is alert and oriented to person, place, and time.    Psychiatric:        Attention and Perception: Attention and perception normal.        Mood and Affect: Mood is anxious.        Speech: Speech normal.    BP 130/90 (BP Location: Right Arm, Patient Position: Sitting, Cuff Size: Large)   Pulse 95   Temp (!) 97.1 F (36.2 C) (Temporal)   Resp 18   Ht 5\' 3"  (1.6 m)   Wt 287 lb 6.4 oz (130.4 kg)   SpO2 98%   BMI 50.91 kg/m  Wt Readings from Last 3 Encounters:  03/26/20 287 lb 6.4 oz (130.4 kg)  02/27/20 290 lb 9.6 oz (131.8 kg)  01/25/20 284 lb (128.8 kg)   ekg --- poor r wave progression ,  Non specific -- L atrial enlargement   Lab Results  Component Value Date   WBC 12.6 (H) 01/25/2020   HGB 13.2 01/25/2020   HCT 39.5 01/25/2020   PLT 537 (H) 01/25/2020   GLUCOSE 133 (H) 01/25/2020   CHOL 171 12/19/2019   TRIG 143.0 12/19/2019   HDL 43.90 12/19/2019   LDLDIRECT 192.0 09/20/2018   LDLCALC 99 12/19/2019   ALT 32 01/25/2020   AST 19 01/25/2020   NA 137 01/25/2020   K 4.1 01/25/2020   CL 101 01/25/2020   CREATININE 0.60 01/25/2020   BUN 10 01/25/2020   CO2 28 01/25/2020   TSH 0.09 (L) 02/27/2020   HGBA1C 6.9 (H) 12/19/2019   MICROALBUR 0.8 12/19/2019    MM DIAG BREAST TOMO BILATERAL  Result Date: 02/14/2020 CLINICAL DATA:  39 year old female presenting with diffuse pain throughout the outer left breast. Strong family history of breast cancer in the patient's mother and maternal grandmother. EXAM: DIGITAL DIAGNOSTIC BILATERAL MAMMOGRAM WITH CAD AND TOMO COMPARISON:  None. ACR Breast Density Category b: There are scattered areas of fibroglandular density. FINDINGS: Right breast: No suspicious mass, distortion, or microcalcifications are identified to suggest presence of malignancy. Left breast: No suspicious mass, distortion, or microcalcifications are identified to suggest presence of malignancy. Specifically there is no finding in the outer left breast to explain the patient's diffuse pain. Mammographic images were  processed with CAD. IMPRESSION: No mammographic or sonographic evidence of malignancy or other finding to explain the patient's diffuse outer left breast pain. RECOMMENDATION: 1. Clinical follow-up as needed for the left breast pain. We discussed some of the issues regarding breast pain, including limiting caffeine, wearing adequate support, over-the-counter pain medication, and possibly vitamin-E. 2.  Screening bilateral mammogram in 1 year (strong family history). 3. Breast cancer risk assessment. If patient qualifies consider high risk screening breast MRI. I have discussed the findings and recommendations with the patient. If applicable, a reminder letter will be sent to the patient regarding the next appointment. BI-RADS CATEGORY  1: Negative. Electronically Signed   By: Audie Pinto M.D.   On: 02/14/2020 11:17     Assessment & Plan:  Plan  I have discontinued  Dewayne Shorter Vitamin C. I am also having her start on FLUoxetine. Additionally, I am having her maintain her b complex vitamins, glucose blood, OneTouch Delica Lancets Fine, Vitamin D (Cholecalciferol), metFORMIN, losartan, levothyroxine, FLUoxetine, and ALPRAZolam.  Meds ordered this encounter  Medications  . FLUoxetine (PROZAC) 20 MG tablet    Sig: 3 po qd    Dispense:  90 tablet    Refill:  3    Problem List Items Addressed This Visit      Unprioritized   Anxiety    Increase prozac to 3, 20 mg ----daily con't prn xanax       Relevant Medications   FLUoxetine (PROZAC) 20 MG tablet   DM (diabetes mellitus) type II uncontrolled, periph vascular disorder (HCC)    hgba1c to be done, minimize simple carbs. Increase exercise as tolerated. Continue current meds       Essential hypertension    Well controlled, no changes to meds. Encouraged heart healthy diet such as the DASH diet and exercise as tolerated.       Hyperlipidemia LDL goal <70    Encouraged heart healthy diet, increase exercise, avoid trans fats,  consider a krill oil cap daily       Other Visit Diagnoses    Uncontrolled type 2 diabetes mellitus with hyperglycemia (HCC)    -  Primary   Relevant Orders   Lipid panel   Hemoglobin A1c   Comprehensive metabolic panel   Palpitations       Relevant Orders   EKG 12-Lead (Completed)   ECHOCARDIOGRAM COMPLETE      Follow-up: Return in about 4 weeks (around 04/23/2020), or if symptoms worsen or fail to improve, for anxiety.  Ann Held, DO

## 2020-03-26 NOTE — Assessment & Plan Note (Signed)
hgba1c to be done, minimize simple carbs. Increase exercise as tolerated. Continue current meds  

## 2020-03-26 NOTE — Patient Instructions (Signed)

## 2020-03-26 NOTE — Assessment & Plan Note (Signed)
Increase prozac to 3, 20 mg ----daily con't prn xanax

## 2020-03-26 NOTE — Assessment & Plan Note (Signed)
Well controlled, no changes to meds. Encouraged heart healthy diet such as the DASH diet and exercise as tolerated.  °

## 2020-03-26 NOTE — Assessment & Plan Note (Signed)
Encouraged heart healthy diet, increase exercise, avoid trans fats, consider a krill oil cap daily 

## 2020-03-27 ENCOUNTER — Other Ambulatory Visit: Payer: Self-pay

## 2020-03-27 MED ORDER — RYBELSUS 3 MG PO TABS: 1.0000 | ORAL_TABLET | Freq: Every day | ORAL | 2 refills | Status: DC

## 2020-03-27 NOTE — Progress Notes (Signed)
r 

## 2020-04-12 ENCOUNTER — Ambulatory Visit (HOSPITAL_BASED_OUTPATIENT_CLINIC_OR_DEPARTMENT_OTHER): Admission: RE | Admit: 2020-04-12 | Payer: 59 | Source: Ambulatory Visit

## 2020-04-16 ENCOUNTER — Other Ambulatory Visit: Payer: Self-pay

## 2020-04-16 ENCOUNTER — Ambulatory Visit (HOSPITAL_COMMUNITY)
Admission: RE | Admit: 2020-04-16 | Discharge: 2020-04-16 | Disposition: A | Discharge status: Home / Self Care | Payer: 59 | Source: Ambulatory Visit | Attending: Family Medicine | Admitting: Family Medicine

## 2020-04-16 DIAGNOSIS — R002 Palpitations: Secondary | ICD-10-CM | POA: Diagnosis not present

## 2020-04-16 DIAGNOSIS — E119 Type 2 diabetes mellitus without complications: Secondary | ICD-10-CM | POA: Diagnosis not present

## 2020-04-16 NOTE — Progress Notes (Signed)
  Echocardiogram 2D Echocardiogram has been performed.  Jennette Dubin 04/16/2020, 9:55 AM

## 2020-04-24 ENCOUNTER — Other Ambulatory Visit: Payer: 59

## 2020-04-25 ENCOUNTER — Ambulatory Visit: Payer: 59 | Admitting: Family Medicine

## 2020-05-02 ENCOUNTER — Other Ambulatory Visit: Payer: 59

## 2020-05-08 ENCOUNTER — Ambulatory Visit: Payer: 59 | Admitting: Psychology

## 2020-05-09 ENCOUNTER — Other Ambulatory Visit: Payer: 59

## 2020-06-26 ENCOUNTER — Ambulatory Visit: Payer: 59 | Admitting: Family

## 2020-06-26 ENCOUNTER — Inpatient Hospital Stay: Payer: 59

## 2020-07-02 ENCOUNTER — Ambulatory Visit: Payer: 59 | Admitting: Internal Medicine

## 2020-07-02 NOTE — Progress Notes (Deleted)
Name: Joanne Harris  MRN/ DOB: [id number], Jun 25, 1981    Age/ Sex: 39 y.o., female     PCP: Ann Held, DO   Reason for Endocrinology Evaluation: Hypothyroidism     Initial Endocrinology Clinic Visit: 02/27/2020    PATIENT IDENTIFIER: [name] is a 39 y.o., female with a past medical history of HTN, T2DM and hypothyroidism. She has followed with [facility] since 02/27/2020 for consultative assistance with management of her hypothyroidism  HISTORICAL SUMMARY:   Pt has been diagnosed with hyperthyroidism in 2002, she is S/P RAI ablation followed by LT- 4 replacement .     Grandmother with thyroid disease  SUBJECTIVE:    Today (07/02/2020):  [name] is here for a follow up on hypothyroidism     Levothyroxine 300 mcg daily EXCEPT SUNDAYS    ROS:  As per HPI.   HISTORY:  Past Medical History:  Past Medical History:  Diagnosis Date  . Depression   . Diabetes mellitus   . Hypothyroid   . Leukocytosis 01/25/2020  . Obesity   . Thrombocytosis ([place]) 01/25/2020    Past Surgical History:  Past Surgical History:  Procedure Laterality Date  . CHOLECYSTECTOMY       Social History:  reports that she has never smoked. She has never used smokeless tobacco. She reports current alcohol use. She reports that she does not use drugs. Family History:  Family History  Problem Relation Age of Onset  . Anemia Mother   . Breast cancer Mother   . Arthritis Mother   . Hypertension Other        grandmother  . Diabetes Other        grandmother  . Stroke Other        grandfather  . Breast cancer Other        grandmother, late 25s  . Diabetes Maternal Grandmother   . Breast cancer Maternal Grandmother       HOME MEDICATIONS: Allergies as of 07/02/2020   No Known Allergies     Medication List       Accurate as of July 02, 2020  7:58 AM. If you have any questions, ask your nurse or doctor.        ALPRAZolam 0.5 MG  tablet Commonly known as: XANAX TAKE 1 TABLET(0.5 MG) BY MOUTH TWICE DAILY AS NEEDED FOR ANXIETY   b complex vitamins tablet Take 1 tablet by mouth daily. Reported on 12/18/2015   FLUoxetine 40 MG capsule Commonly known as: PROZAC Take 1 capsule (40 mg total) by mouth daily.   FLUoxetine 20 MG tablet Commonly known as: PROZAC 3 po qd   glucose blood test strip Commonly known as: OneTouch Verio Use to test blood sugar 2 times daily as instructed.   levothyroxine 300 MCG tablet Commonly known as: Synthroid Take 1 tablet (300 mcg total) by mouth daily.   losartan 25 MG tablet Commonly known as: COZAAR TAKE 1 TABLET(25 MG) BY MOUTH DAILY   metFORMIN 1000 MG tablet Commonly known as: GLUCOPHAGE Take 1 tablet (1,000 mg total) by mouth 2 (two) times daily with a meal.   OneTouch Delica Lancets Fine Misc Use to test blood sugar 2 times daily as instructed.   Rybelsus 3 MG Tabs Generic drug: Semaglutide Take 1 tablet by mouth daily.   Vitamin D (Cholecalciferol) 25 MCG (1000 UT) Caps Take 2 capsules by mouth daily. Reported on 12/02/2015         OBJECTIVE:  PHYSICAL EXAM: VS: There were no vitals taken for this visit.   EXAM: General: Pt appears well and is in NAD  Hydration: Well-hydrated with moist mucous membranes and good skin turgor  Eyes: External eye exam normal without stare, lid lag or exophthalmos.  EOM intact.  PERRL.  Ears, Nose, Throat: Hearing: Grossly intact bilaterally Dental: Good dentition  Throat: Clear without mass, erythema or exudate  Neck: General: Supple without adenopathy. Thyroid: Thyroid size normal.  No goiter or nodules appreciated. No thyroid bruit.  Lungs: Clear with good BS bilat with no rales, rhonchi, or wheezes  Heart: Auscultation: RRR.  Abdomen: Normoactive bowel sounds, soft, nontender, without masses or organomegaly palpable  Extremities: Gait and station: Normal gait  Digits and nails: No clubbing, cyanosis, petechiae, or  nodes Head and neck: Normal alignment and mobility BL UE: Normal ROM and strength. BL LE: No pretibial edema normal ROM and strength.  Skin: Hair: Texture and amount normal with gender appropriate distribution Skin Inspection: No rashes, acanthosis nigricans/skin tags. No lipohypertrophy Skin Palpation: Skin temperature, texture, and thickness normal to palpation  Neuro: Cranial nerves: II - XII grossly intact  Cerebellar: Normal coordination and movement; no tremor Motor: Normal strength throughout DTRs: 2+ and symmetric in UE without delay in relaxation phase  Mental Status: Judgment, insight: Intact Orientation: Oriented to time, place, and person Memory: Intact for recent and remote events Mood and affect: No depression, anxiety, or agitation     DATA REVIEWED: ***    ASSESSMENT / PLAN / RECOMMENDATIONS:   1. ***  Plan:  ***    Medications   ***   Signed electronically by: Mack Guise, MD  [facility]  [address] [place], [place] [address] Phone: [phone] FAX: [phone]      CC: Ann Held, DO [address] [place] [address] Phone: [phone]  Fax: [phone]   Return to Endocrinology clinic as below: Future Appointments  Date Time Provider [place]  07/02/2020  9:10 AM Demarie Uhlig, Melanie Crazier, MD [facility]  07/24/2020  9:15 AM [facility] None  07/24/2020  9:45 AM [place], Holli Humbles, NP [facility] None

## 2020-07-04 ENCOUNTER — Telehealth: Payer: Self-pay | Admitting: Family

## 2020-07-04 NOTE — Telephone Encounter (Signed)
LEFT VOICEMAIL TO INFORM PATIENT OF [place] APPT 9/29 TO 9/28 DUE TO [place] ON PAL

## 2020-07-05 ENCOUNTER — Ambulatory Visit: Payer: 59 | Admitting: Internal Medicine

## 2020-07-23 ENCOUNTER — Ambulatory Visit: Payer: 59 | Admitting: Internal Medicine

## 2020-07-23 ENCOUNTER — Inpatient Hospital Stay: Payer: 59 | Attending: Family | Admitting: Family

## 2020-07-23 ENCOUNTER — Inpatient Hospital Stay: Payer: 59

## 2020-07-23 NOTE — Progress Notes (Deleted)
Name: Sallie Staron  MRN/ DOB: [id number], 10-23-1981    Age/ Sex: 39 y.o., female     PCP: Ann Held, DO   Reason for Endocrinology Evaluation: Hypothyroidism     Initial Endocrinology Clinic Visit: 02/27/2020    PATIENT IDENTIFIER: Ms. Antonia Jicha is a 39 y.o., female with a past medical history of HTN, T2DM and hypothyroidism. She has followed with [facility] since 02/27/2020 for consultative assistance with management of her hypothyroidism  HISTORICAL SUMMARY:   Pt has been diagnosed with hyperthyroidism in 2002, she is S/P RAI ablation followed by LT- 4 replacement .     Grandmother with thyroid disease  SUBJECTIVE:    Today (07/23/2020):  Ms. Spraggins is here for a follow up on hypothyroidism     Levothyroxine 300 mcg daily EXCEPT SUNDAYS    ROS:  As per HPI.   HISTORY:  Past Medical History:  Past Medical History:  Diagnosis Date  . Depression   . Diabetes mellitus   . Hypothyroid   . Leukocytosis 01/25/2020  . Obesity   . Thrombocytosis ([place]) 01/25/2020   Past Surgical History:  Past Surgical History:  Procedure Laterality Date  . CHOLECYSTECTOMY      Social History:  reports that she has never smoked. She has never used smokeless tobacco. She reports current alcohol use. She reports that she does not use drugs. Family History:  Family History  Problem Relation Age of Onset  . Anemia Mother   . Breast cancer Mother   . Arthritis Mother   . Hypertension Other        grandmother  . Diabetes Other        grandmother  . Stroke Other        grandfather  . Breast cancer Other        grandmother, late 56s  . Diabetes Maternal Grandmother   . Breast cancer Maternal Grandmother      HOME MEDICATIONS: Allergies as of 07/23/2020   No Known Allergies     Medication List       Accurate as of July 23, 2020  7:37 AM. If you have any questions, ask your nurse or doctor.        ALPRAZolam 0.5 MG  tablet Commonly known as: XANAX TAKE 1 TABLET(0.5 MG) BY MOUTH TWICE DAILY AS NEEDED FOR ANXIETY   b complex vitamins tablet Take 1 tablet by mouth daily. Reported on 12/18/2015   FLUoxetine 40 MG capsule Commonly known as: PROZAC Take 1 capsule (40 mg total) by mouth daily.   FLUoxetine 20 MG tablet Commonly known as: PROZAC 3 po qd   glucose blood test strip Commonly known as: OneTouch Verio Use to test blood sugar 2 times daily as instructed.   levothyroxine 300 MCG tablet Commonly known as: Synthroid Take 1 tablet (300 mcg total) by mouth daily.   losartan 25 MG tablet Commonly known as: COZAAR TAKE 1 TABLET(25 MG) BY MOUTH DAILY   metFORMIN 1000 MG tablet Commonly known as: GLUCOPHAGE Take 1 tablet (1,000 mg total) by mouth 2 (two) times daily with a meal.   OneTouch Delica Lancets Fine Misc Use to test blood sugar 2 times daily as instructed.   Rybelsus 3 MG Tabs Generic drug: Semaglutide Take 1 tablet by mouth daily.   Vitamin D (Cholecalciferol) 25 MCG (1000 UT) Caps Take 2 capsules by mouth daily. Reported on 12/02/2015         OBJECTIVE:   PHYSICAL EXAM:  VS: There were no vitals taken for this visit.   EXAM: General: Pt appears well and is in NAD  Hydration: Well-hydrated with moist mucous membranes and good skin turgor  Eyes: External eye exam normal without stare, lid lag or exophthalmos.  EOM intact.  PERRL.  Ears, Nose, Throat: Hearing: Grossly intact bilaterally Dental: Good dentition  Throat: Clear without mass, erythema or exudate  Neck: General: Supple without adenopathy. Thyroid: Thyroid size normal.  No goiter or nodules appreciated. No thyroid bruit.  Lungs: Clear with good BS bilat with no rales, rhonchi, or wheezes  Heart: Auscultation: RRR.  Abdomen: Normoactive bowel sounds, soft, nontender, without masses or organomegaly palpable  Extremities: Gait and station: Normal gait  Digits and nails: No clubbing, cyanosis, petechiae, or  nodes Head and neck: Normal alignment and mobility BL UE: Normal ROM and strength. BL LE: No pretibial edema normal ROM and strength.  Skin: Hair: Texture and amount normal with gender appropriate distribution Skin Inspection: No rashes, acanthosis nigricans/skin tags. No lipohypertrophy Skin Palpation: Skin temperature, texture, and thickness normal to palpation  Neuro: Cranial nerves: II - XII grossly intact  Cerebellar: Normal coordination and movement; no tremor Motor: Normal strength throughout DTRs: 2+ and symmetric in UE without delay in relaxation phase  Mental Status: Judgment, insight: Intact Orientation: Oriented to time, place, and person Memory: Intact for recent and remote events Mood and affect: No depression, anxiety, or agitation     DATA REVIEWED: ***    ASSESSMENT / PLAN / RECOMMENDATIONS:   1. ***  Plan:  ***    Medications   ***   Signed electronically by: Mack Guise, MD  [facility]  [address] [place], [place] [address] Phone: [phone] FAX: [phone]      CC: Ann Held, DO [address] [place] [address] Phone: [phone]  Fax: [phone]   Return to Endocrinology clinic as below: Future Appointments  Date Time Provider [place]  07/23/2020  9:10 AM Ronesha Heenan, Melanie Crazier, MD [facility]  07/23/2020  2:00 PM [facility] None  07/23/2020  2:15 PM Jefferson Davis, NP [facility] None

## 2020-07-24 ENCOUNTER — Ambulatory Visit: Payer: 59 | Admitting: Family

## 2020-07-24 ENCOUNTER — Other Ambulatory Visit: Payer: 59

## 2020-07-29 ENCOUNTER — Other Ambulatory Visit: Payer: Self-pay | Admitting: Family

## 2020-07-29 DIAGNOSIS — D75839 Thrombocytosis, unspecified: Secondary | ICD-10-CM

## 2020-07-29 DIAGNOSIS — D508 Other iron deficiency anemias: Secondary | ICD-10-CM

## 2020-07-29 DIAGNOSIS — D72823 Leukemoid reaction: Secondary | ICD-10-CM

## 2020-07-30 ENCOUNTER — Ambulatory Visit: Payer: 59 | Admitting: Internal Medicine

## 2020-07-30 ENCOUNTER — Inpatient Hospital Stay: Payer: 59 | Attending: Hematology & Oncology

## 2020-07-30 NOTE — Progress Notes (Deleted)
Name: Joanne Harris  MRN/ DOB: [id number], 1981/08/17    Age/ Sex: 39 y.o., female     PCP: Ann Held, DO   Reason for Endocrinology Evaluation: Hypothyroidism     Initial Endocrinology Clinic Visit: 02/27/2020    PATIENT IDENTIFIER: Joanne Harris is a 39 y.o., female with a past medical history of HTN, T2DM and hypothyroidism. She has followed with [facility] since 02/27/2020 for consultative assistance with management of her hypothyroidism  HISTORICAL SUMMARY:   Pt has been diagnosed with hyperthyroidism in 2002, she is S/P RAI ablation followed by LT- 4 replacement .     Grandmother with thyroid disease  SUBJECTIVE:    Today (07/30/2020):  Joanne Harris is here for a follow up on hypothyroidism     Levothyroxine 300 mcg daily EXCEPT SUNDAYS    ROS:  As per HPI.   HISTORY:  Past Medical History:  Past Medical History:  Diagnosis Date  . Depression   . Diabetes mellitus   . Hypothyroid   . Leukocytosis 01/25/2020  . Obesity   . Thrombocytosis ([place]) 01/25/2020   Past Surgical History:  Past Surgical History:  Procedure Laterality Date  . CHOLECYSTECTOMY      Social History:  reports that she has never smoked. She has never used smokeless tobacco. She reports current alcohol use. She reports that she does not use drugs. Family History:  Family History  Problem Relation Age of Onset  . Anemia Mother   . Breast cancer Mother   . Arthritis Mother   . Hypertension Other        grandmother  . Diabetes Other        grandmother  . Stroke Other        grandfather  . Breast cancer Other        grandmother, late 46s  . Diabetes Maternal Grandmother   . Breast cancer Maternal Grandmother      HOME MEDICATIONS: Allergies as of 07/30/2020   No Known Allergies     Medication List       Accurate as of July 30, 2020  7:14 AM. If you have any questions, ask your nurse or doctor.        ALPRAZolam 0.5 MG  tablet Commonly known as: XANAX TAKE 1 TABLET(0.5 MG) BY MOUTH TWICE DAILY AS NEEDED FOR ANXIETY   b complex vitamins tablet Take 1 tablet by mouth daily. Reported on 12/18/2015   FLUoxetine 40 MG capsule Commonly known as: PROZAC Take 1 capsule (40 mg total) by mouth daily.   FLUoxetine 20 MG tablet Commonly known as: PROZAC 3 po qd   glucose blood test strip Commonly known as: OneTouch Verio Use to test blood sugar 2 times daily as instructed.   levothyroxine 300 MCG tablet Commonly known as: Synthroid Take 1 tablet (300 mcg total) by mouth daily.   losartan 25 MG tablet Commonly known as: COZAAR TAKE 1 TABLET(25 MG) BY MOUTH DAILY   metFORMIN 1000 MG tablet Commonly known as: GLUCOPHAGE Take 1 tablet (1,000 mg total) by mouth 2 (two) times daily with a meal.   OneTouch Delica Lancets Fine Misc Use to test blood sugar 2 times daily as instructed.   Rybelsus 3 MG Tabs Generic drug: Semaglutide Take 1 tablet by mouth daily.   Vitamin D (Cholecalciferol) 25 MCG (1000 UT) Caps Take 2 capsules by mouth daily. Reported on 12/02/2015         OBJECTIVE:   PHYSICAL EXAM:  VS: There were no vitals taken for this visit.   EXAM: General: Pt appears well and is in NAD  Hydration: Well-hydrated with moist mucous membranes and good skin turgor  Eyes: External eye exam normal without stare, lid lag or exophthalmos.  EOM intact.  PERRL.  Ears, Nose, Throat: Hearing: Grossly intact bilaterally Dental: Good dentition  Throat: Clear without mass, erythema or exudate  Neck: General: Supple without adenopathy. Thyroid: Thyroid size normal.  No goiter or nodules appreciated. No thyroid bruit.  Lungs: Clear with good BS bilat with no rales, rhonchi, or wheezes  Heart: Auscultation: RRR.  Abdomen: Normoactive bowel sounds, soft, nontender, without masses or organomegaly palpable  Extremities: Gait and station: Normal gait  Digits and nails: No clubbing, cyanosis, petechiae, or  nodes Head and neck: Normal alignment and mobility BL UE: Normal ROM and strength. BL LE: No pretibial edema normal ROM and strength.  Skin: Hair: Texture and amount normal with gender appropriate distribution Skin Inspection: No rashes, acanthosis nigricans/skin tags. No lipohypertrophy Skin Palpation: Skin temperature, texture, and thickness normal to palpation  Neuro: Cranial nerves: II - XII grossly intact  Cerebellar: Normal coordination and movement; no tremor Motor: Normal strength throughout DTRs: 2+ and symmetric in UE without delay in relaxation phase  Mental Status: Judgment, insight: Intact Orientation: Oriented to time, place, and person Memory: Intact for recent and remote events Mood and affect: No depression, anxiety, or agitation     DATA REVIEWED: ***    ASSESSMENT / PLAN / RECOMMENDATIONS:   1. ***  Plan:  ***    Medications   ***   Signed electronically by: Mack Guise, MD  [facility]  [address] [place], [place] [address] Phone: [phone] FAX: [phone]      CC: Ann Held, DO [address] [place] [address] Phone: [phone]  Fax: [phone]   Return to Endocrinology clinic as below: Future Appointments  Date Time Provider [place]  07/30/2020  9:50 AM Kimari Lienhard, Melanie Crazier, MD [facility]  07/30/2020 10:45 AM [facility] None

## 2020-12-17 ENCOUNTER — Ambulatory Visit (INDEPENDENT_AMBULATORY_CARE_PROVIDER_SITE_OTHER): Payer: 59 | Admitting: Psychology

## 2020-12-17 DIAGNOSIS — F411 Generalized anxiety disorder: Secondary | ICD-10-CM

## 2021-01-15 ENCOUNTER — Ambulatory Visit (INDEPENDENT_AMBULATORY_CARE_PROVIDER_SITE_OTHER): Payer: 59 | Admitting: Psychology

## 2021-01-15 DIAGNOSIS — F411 Generalized anxiety disorder: Secondary | ICD-10-CM | POA: Diagnosis not present

## 2021-01-20 ENCOUNTER — Other Ambulatory Visit: Payer: Self-pay | Admitting: Family Medicine

## 2021-01-20 DIAGNOSIS — Z1231 Encounter for screening mammogram for malignant neoplasm of breast: Secondary | ICD-10-CM

## 2021-02-19 ENCOUNTER — Ambulatory Visit: Payer: 59 | Admitting: Psychology

## 2021-03-12 ENCOUNTER — Other Ambulatory Visit: Payer: Self-pay

## 2021-03-12 ENCOUNTER — Ambulatory Visit
Admission: RE | Admit: 2021-03-12 | Discharge: 2021-03-12 | Disposition: A | Discharge status: Home / Self Care | Payer: 59 | Source: Ambulatory Visit | Attending: Family Medicine | Admitting: Family Medicine

## 2021-03-12 DIAGNOSIS — Z1231 Encounter for screening mammogram for malignant neoplasm of breast: Secondary | ICD-10-CM

## 2021-03-25 ENCOUNTER — Other Ambulatory Visit: Payer: Self-pay

## 2021-03-25 ENCOUNTER — Other Ambulatory Visit: Payer: Self-pay | Admitting: Family Medicine

## 2021-03-25 ENCOUNTER — Encounter: Payer: Self-pay | Admitting: Internal Medicine

## 2021-03-25 ENCOUNTER — Ambulatory Visit (INDEPENDENT_AMBULATORY_CARE_PROVIDER_SITE_OTHER): Payer: 59 | Admitting: Internal Medicine

## 2021-03-25 VITALS — BP 146/92 | HR 97 | Ht 63.0 in | Wt 293.5 lb

## 2021-03-25 DIAGNOSIS — F419 Anxiety disorder, unspecified: Secondary | ICD-10-CM

## 2021-03-25 DIAGNOSIS — E1151 Type 2 diabetes mellitus with diabetic peripheral angiopathy without gangrene: Secondary | ICD-10-CM

## 2021-03-25 DIAGNOSIS — I10 Essential (primary) hypertension: Secondary | ICD-10-CM

## 2021-03-25 DIAGNOSIS — E890 Postprocedural hypothyroidism: Secondary | ICD-10-CM | POA: Insufficient documentation

## 2021-03-25 DIAGNOSIS — IMO0002 Reserved for concepts with insufficient information to code with codable children: Secondary | ICD-10-CM

## 2021-03-25 LAB — TSH: TSH: 166.34 u[IU]/mL — ABNORMAL HIGH (ref 0.35–4.50)

## 2021-03-25 MED ORDER — LEVOTHYROXINE SODIUM 200 MCG PO TABS: 200.0000 ug | ORAL_TABLET | Freq: Every day | ORAL | 1 refills | Status: DC

## 2021-03-25 NOTE — Patient Instructions (Signed)

## 2021-03-25 NOTE — Progress Notes (Signed)
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 She has followed with [facility] since 02/27/2020 for consultative assistance with management of her hypothyroidism.   HISTORICAL SUMMARY:  Pt has been diagnosed with hyperthyroidism in 2002, she is S/P RAI ablation followed by LT- 4 replacement .   Historically has had compliance issues, was discharged from Dr. Arman Filter care in 2018 due to noncompliance. Established care with me in 02/2020.   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 She has not been here in a year. She was supposed to return in 06/2020.    Today she states she has not taken Levothyroxine in a few months(06/2020)  due to loss of insurance.  She has not restated it yet   She did not notice weight gain  Has been exhausted Denies constipation  Has depression   Has noted tingling of feet.     Levothyroxine 300 mcg daily EXCEPT sundays- has not   HISTORY:  Past Medical History:  Past Medical History:  Diagnosis Date  . Depression   . Diabetes mellitus   . Hypothyroid   . Leukocytosis 01/25/2020  . Obesity   . Thrombocytosis 01/25/2020   Past Surgical History:  Past Surgical History:  Procedure Laterality Date  . CHOLECYSTECTOMY      Social History:  reports that she has never smoked. She has never used smokeless tobacco. She reports current alcohol use. She reports that she does not use drugs. Family History:  Family History  Problem Relation Age of Onset  . Anemia Mother   . Breast cancer Mother   . Arthritis Mother   . Hypertension  Other        grandmother  . Diabetes Other        grandmother  . Stroke Other        grandfather  . Breast cancer Other        grandmother, late 7s  . Diabetes Maternal Grandmother   . Breast cancer Maternal Grandmother      HOME MEDICATIONS: Allergies as of 03/25/2021   No Known Allergies     Medication List       Accurate as of Mar 25, 2021  2:01 PM. If you have any questions, ask your nurse or doctor.        ALPRAZolam 0.5 MG tablet Commonly known as: XANAX TAKE 1 TABLET(0.5 MG) BY MOUTH TWICE DAILY AS NEEDED FOR ANXIETY   b complex vitamins tablet Take 1 tablet by mouth daily. Reported on 12/18/2015   FLUoxetine 40 MG capsule Commonly known as: PROZAC Take 1 capsule (40 mg total) by mouth daily.   FLUoxetine 20 MG tablet Commonly known as: PROZAC 3 po qd   glucose blood test strip Commonly known as: OneTouch Verio Use to test blood sugar 2 times daily as instructed.   levothyroxine 200 MCG tablet Commonly known as: SYNTHROID Take 1 tablet (200 mcg total) by mouth daily. What changed:   medication strength  how much to take Changed by: Dorita Sciara, MD   losartan 25 MG tablet Commonly known  as: COZAAR TAKE 1 TABLET(25 MG) BY MOUTH DAILY   metFORMIN 1000 MG tablet Commonly known as: GLUCOPHAGE Take 1 tablet (1,000 mg total) by mouth 2 (two) times daily with a meal.   OneTouch Delica Lancets Fine Misc Use to test blood sugar 2 times daily as instructed.   Rybelsus 3 MG Tabs Generic drug: Semaglutide Take 1 tablet by mouth daily.   Vitamin D (Cholecalciferol) 25 MCG (1000 UT) Caps Take 2 capsules by mouth daily. Reported on 12/02/2015         OBJECTIVE:   PHYSICAL EXAM: VS: BP (!) 146/92   Pulse 97   Ht 5\' 3"  (1.6 m)   Wt 293 lb 8 oz (133.1 kg)   SpO2 99%   BMI 51.99 kg/m    EXAM: General: Pt appears well and is in NAD  Neck: General: Supple without adenopathy. Thyroid:  No goiter or nodules appreciated.   Lungs: Clear  with good BS bilat with no rales, rhonchi, or wheezes  Heart: Auscultation: RRR.  Abdomen: Normoactive bowel sounds, soft, nontender, without masses or organomegaly palpable  Extremities:  BL LE: No pretibial edema normal ROM and strength.  Mental Status: Judgment, insight: Intact Memory: Intact for recent and remote events Mood and affect: No depression, anxiety, or agitation     DATA REVIEWED:  Results for Joanne, Harris (MRN [id number]) as of 03/25/2021 14:01  Ref. Range 03/25/2021 09:23  TSH Latest Ref Range: 0.35 - 4.50 uIU/mL 166.34 (H)     ASSESSMENT / PLAN / RECOMMENDATIONS:   1. Postablative Hypothyroidism:   - Chronic histotry of medication nonadherent and missed visitations.  - She has not had her levothyroxine in months due to lack of insurance and when she obtained health insurance she has not attempted to refill her levothyroxine.  - Discussed risk of myxedema coma with insufficient LT-4 intake  -- Pt educated extensively on the correct way to take levothyroxine (first thing in the morning with water, 30 minutes before eating or taking other medications). - Pt encouraged to double dose the following day if she were to miss a dose given long half-life of levothyroxine.   Medications   Start Levothyroxine 200 mcg daily   Labs in 8 weeks  F/U in 4 months    Signed electronically by: Mack Guise, MD  [facility] Endocrin[facility]  [address] [place], [place] [address] Phone: [phone] FAX: [phone]      CC: Ann Held, DO [address] [place] [address] Phone: [phone]  Fax: [phone]   Return to Endocrinology clinic as below: Future Appointments  Date Time Provider [place]  03/28/2021  1:30 PM Midge Minium, MD [facility]  04/08/2021  5:00 PM Bauert, Nicolasa Ducking, LCSW [facility] None  05/06/2021  4:00 PM Bauert, Nicolasa Ducking, LCSW [facility] None  05/20/2021   9:15 AM [facility]  07/29/2021  8:30 AM Joshlyn Beadle, Melanie Crazier, MD [facility]

## 2021-03-28 ENCOUNTER — Encounter: Payer: 59 | Admitting: Family Medicine

## 2021-04-08 ENCOUNTER — Ambulatory Visit (INDEPENDENT_AMBULATORY_CARE_PROVIDER_SITE_OTHER): Payer: 59 | Admitting: Psychology

## 2021-04-08 DIAGNOSIS — F411 Generalized anxiety disorder: Secondary | ICD-10-CM | POA: Diagnosis not present

## 2021-04-23 ENCOUNTER — Encounter: Payer: Self-pay | Admitting: *Deleted

## 2021-04-23 ENCOUNTER — Ambulatory Visit (INDEPENDENT_AMBULATORY_CARE_PROVIDER_SITE_OTHER): Payer: 59 | Admitting: Family Medicine

## 2021-04-23 ENCOUNTER — Encounter: Payer: Self-pay | Admitting: Family Medicine

## 2021-04-23 ENCOUNTER — Other Ambulatory Visit: Payer: Self-pay

## 2021-04-23 VITALS — BP 124/70 | HR 91 | Temp 97.3°F | Resp 20 | Ht 64.5 in | Wt 294.4 lb

## 2021-04-23 DIAGNOSIS — Z114 Encounter for screening for human immunodeficiency virus [HIV]: Secondary | ICD-10-CM

## 2021-04-23 DIAGNOSIS — F419 Anxiety disorder, unspecified: Secondary | ICD-10-CM

## 2021-04-23 DIAGNOSIS — Z0000 Encounter for general adult medical examination without abnormal findings: Secondary | ICD-10-CM

## 2021-04-23 DIAGNOSIS — E1165 Type 2 diabetes mellitus with hyperglycemia: Secondary | ICD-10-CM | POA: Diagnosis not present

## 2021-04-23 DIAGNOSIS — Z23 Encounter for immunization: Secondary | ICD-10-CM | POA: Diagnosis not present

## 2021-04-23 DIAGNOSIS — Z1159 Encounter for screening for other viral diseases: Secondary | ICD-10-CM | POA: Diagnosis not present

## 2021-04-23 DIAGNOSIS — E1151 Type 2 diabetes mellitus with diabetic peripheral angiopathy without gangrene: Secondary | ICD-10-CM

## 2021-04-23 DIAGNOSIS — IMO0002 Reserved for concepts with insufficient information to code with codable children: Secondary | ICD-10-CM

## 2021-04-23 LAB — BASIC METABOLIC PANEL
BUN: 10 mg/dL (ref 6–23)
CO2: 21 mEq/L (ref 19–32)
Calcium: 9 mg/dL (ref 8.4–10.5)
Chloride: 102 mEq/L (ref 96–112)
Creatinine, Ser: 0.61 mg/dL (ref 0.40–1.20)
GFR: 113.27 mL/min (ref >60.00)
Glucose, Bld: 212 mg/dL — ABNORMAL HIGH (ref 70–99)
Potassium: 3.9 mEq/L (ref 3.5–5.1)
Sodium: 136 mEq/L (ref 135–145)

## 2021-04-23 LAB — LIPID PANEL
Cholesterol: 162 mg/dL (ref 0–200)
HDL: 51.3 mg/dL (ref >39.00)
LDL Cholesterol: 93 mg/dL (ref 0–99)
NonHDL: 111.08
Total CHOL/HDL Ratio: 3
Triglycerides: 92 mg/dL (ref 0.0–149.0)
VLDL: 18.4 mg/dL (ref 0.0–40.0)

## 2021-04-23 LAB — HEPATIC FUNCTION PANEL
ALT: 24 U/L (ref 0–35)
AST: 21 U/L (ref 0–37)
Albumin: 4.3 g/dL (ref 3.5–5.2)
Alkaline Phosphatase: 59 U/L (ref 39–117)
Bilirubin, Direct: 0.1 mg/dL (ref 0.0–0.3)
Total Bilirubin: 0.4 mg/dL (ref 0.2–1.2)
Total Protein: 7.6 g/dL (ref 6.0–8.3)

## 2021-04-23 LAB — TSH: TSH: 7.35 u[IU]/mL — ABNORMAL HIGH (ref 0.35–4.50)

## 2021-04-23 LAB — VITAMIN D 25 HYDROXY (VIT D DEFICIENCY, FRACTURES): VITD: 25.99 ng/mL — ABNORMAL LOW (ref 30.00–100.00)

## 2021-04-23 MED ORDER — ALPRAZOLAM 0.5 MG PO TABS: ORAL_TABLET | ORAL | 1 refills | Status: DC

## 2021-04-23 MED ORDER — TRIAMCINOLONE ACETONIDE 0.1 % EX OINT: 1.0000 "application " | TOPICAL_OINTMENT | Freq: Two times a day (BID) | CUTANEOUS | 1 refills | Status: DC

## 2021-04-23 NOTE — Assessment & Plan Note (Signed)
Pt's BMI is 49.75  Encouraged healthy diet and regular exercise.  Check labs to risk stratify.  Will follow.

## 2021-04-23 NOTE — Progress Notes (Signed)
   Subjective:    Patient ID: Joanne Harris, female    DOB: May 31, 1981, 40 y.o.   MRN: [id number]  HPI CPE- due for pap, eye exam, foot exam.  Due for Tdap.  UTD on mammo, COVID  Reviewed past medical, surgical, family and social histories.   Patient Care Team    Relationship Specialty Notifications Start End  Carollee Herter, Alferd Apa, [place] PCP - General Family Medicine  12/03/17   Marylynn Pearson, MD Consulting Physician Obstetrics and Gynecology  11/19/15   Shamleffer, Melanie Crazier, MD Consulting Physician Endocrinology  01/16/20     Health Maintenance  Topic Date Due   Pneumococcal Vaccine 37-22 Years old (1 - PCV) Never done   HIV Screening  Never done   Hepatitis C Screening  Never done   TETANUS/TDAP  Never done   COVID-19 Vaccine (2 - Moderna risk series) 04/06/2020   HEMOGLOBIN A1C  09/25/2020   OPHTHALMOLOGY EXAM  12/03/2020   PAP SMEAR-Modifier  12/08/2020   FOOT EXAM  12/18/2020   INFLUENZA VACCINE  05/26/2021   PNEUMOCOCCAL POLYSACCHARIDE VACCINE AGE 16-64 HIGH RISK  Completed   HPV VACCINES  Aged Out      Review of Systems Patient reports no vision/ hearing changes, adenopathy,fever, weight change,  persistant/recurrent hoarseness , swallowing issues, chest pain, palpitations, edema, persistant/recurrent cough, hemoptysis, dyspnea (rest/exertional/paroxysmal nocturnal), gastrointestinal bleeding (melena, rectal bleeding), abdominal pain, significant heartburn, bowel changes, GU symptoms (dysuria, hematuria, incontinence), Gyn symptoms (abnormal  bleeding, pain),  syncope, focal weakness, memory loss, numbness & tingling, skin/hair/nail changes, abnormal bruising or bleeding, anxiety, or depression.   This visit occurred during the SARS-CoV-2 public health emergency.  Safety protocols were in place, including screening questions prior to the visit, additional usage of staff PPE, and extensive cleaning of exam room while observing appropriate contact time as indicated for  disinfecting solutions.      Objective:   Physical Exam General Appearance:    Alert, cooperative, no distress, appears stated age, obese  Head:    Normocephalic, without obvious abnormality, atraumatic  Eyes:    PERRL, conjunctiva/corneas clear, EOM's intact, both eyes.  Prominent eyes bilaterally  Ears:    Normal TM's and external ear canals, both ears  Nose:   Deferred due to COVID  Throat:   Neck:   Supple, symmetrical, trachea midline, no adenopathy;    Thyroid: no enlargement/tenderness/nodules  Back:     Symmetric, no curvature, ROM normal, no CVA tenderness  Lungs:     Clear to auscultation bilaterally, respirations unlabored  Chest Wall:    No tenderness or deformity   Heart:    Regular rate and rhythm, S1 and S2 normal, no murmur, rub   or gallop  Breast Exam:    Deferred to GYN  Abdomen:     Soft, non-tender, bowel sounds active all four quadrants,    no masses, no organomegaly  Genitalia:    Deferred to GYN  Rectal:    Extremities:   Extremities normal, atraumatic, no cyanosis or edema  Pulses:   2+ and symmetric all extremities  Skin:   Skin color, texture, turgor normal, eczematous rash of R neck/shoulder  Lymph nodes:   Cervical, supraclavicular, and axillary nodes normal  Neurologic:   CNII-XII intact, normal strength, sensation and reflexes    throughout          Assessment & Plan:

## 2021-04-23 NOTE — Assessment & Plan Note (Signed)
Refill on Alprazolam provided. 

## 2021-04-23 NOTE — Assessment & Plan Note (Signed)
Pt's PE unchanged from previous and WNL w/ exception of obesity, prominent eyes, and eczematous rash.  UTD on mammo.  Due for pap- pt to schedule.  Tdap updated.  Check labs.  Anticipatory guidance provided.

## 2021-04-23 NOTE — Addendum Note (Signed)
Addended by: Genevie Cheshire L on: 04/23/2021 04:01 PM   Modules accepted: Orders

## 2021-04-23 NOTE — Assessment & Plan Note (Signed)
Chronic problem, on Metformin.  Due for eye exam- pt to schedule.  Foot exam done today.  On ARB for renal protection.  Check labs.  Adjust meds prn

## 2021-04-23 NOTE — Patient Instructions (Signed)
Follow up in 3-4 months to recheck diabetes We'll notify you of your lab results and make any changes if needed Call and schedule your pap and eye exam Continue to work on healthy diet and regular physical activity- you can do it! Use the Triamcinolone twice daily as needed on the rash Call with any questions or concerns Stay Safe!  Stay Healthy! Have a great summer!!!

## 2021-04-24 ENCOUNTER — Other Ambulatory Visit: Payer: Self-pay

## 2021-04-24 DIAGNOSIS — Z0000 Encounter for general adult medical examination without abnormal findings: Secondary | ICD-10-CM

## 2021-04-24 LAB — HEPATITIS C ANTIBODY
Hepatitis C Ab: NONREACTIVE
SIGNAL TO CUT-OFF: 0.02 (ref <1.00)

## 2021-04-24 LAB — HIV ANTIBODY (ROUTINE TESTING W REFLEX): HIV 1&2 Ab, 4th Generation: NONREACTIVE

## 2021-04-24 MED ORDER — VITAMIN D (ERGOCALCIFEROL) 1.25 MG (50000 UNIT) PO CAPS: 50000.0000 [IU] | ORAL_CAPSULE | ORAL | 0 refills | Status: AC

## 2021-05-06 ENCOUNTER — Ambulatory Visit (INDEPENDENT_AMBULATORY_CARE_PROVIDER_SITE_OTHER): Payer: 59 | Admitting: Psychology

## 2021-05-06 DIAGNOSIS — F411 Generalized anxiety disorder: Secondary | ICD-10-CM

## 2021-05-20 ENCOUNTER — Other Ambulatory Visit: Payer: 59

## 2021-05-20 ENCOUNTER — Other Ambulatory Visit (INDEPENDENT_AMBULATORY_CARE_PROVIDER_SITE_OTHER): Payer: 59

## 2021-05-20 ENCOUNTER — Other Ambulatory Visit: Payer: Self-pay

## 2021-05-20 DIAGNOSIS — E1165 Type 2 diabetes mellitus with hyperglycemia: Secondary | ICD-10-CM | POA: Diagnosis not present

## 2021-05-20 DIAGNOSIS — E1151 Type 2 diabetes mellitus with diabetic peripheral angiopathy without gangrene: Secondary | ICD-10-CM | POA: Diagnosis not present

## 2021-05-20 DIAGNOSIS — R7309 Other abnormal glucose: Secondary | ICD-10-CM

## 2021-05-20 DIAGNOSIS — IMO0002 Reserved for concepts with insufficient information to code with codable children: Secondary | ICD-10-CM

## 2021-05-20 LAB — CBC WITH DIFFERENTIAL/PLATELET
Basophils Absolute: 0.1 10*3/uL (ref 0.0–0.1)
Basophils Relative: 0.6 % (ref 0.0–3.0)
Eosinophils Absolute: 0.2 10*3/uL (ref 0.0–0.7)
Eosinophils Relative: 2.3 % (ref 0.0–5.0)
HCT: 39.9 % (ref 36.0–46.0)
Hemoglobin: 13.3 g/dL (ref 12.0–15.0)
Lymphocytes Relative: 27.2 % (ref 12.0–46.0)
Lymphs Abs: 2.9 10*3/uL (ref 0.7–4.0)
MCHC: 33.3 g/dL (ref 30.0–36.0)
MCV: 97.4 fl (ref 78.0–100.0)
Monocytes Absolute: 0.6 10*3/uL (ref 0.1–1.0)
Monocytes Relative: 5.5 % (ref 3.0–12.0)
Neutro Abs: 6.8 10*3/uL (ref 1.4–7.7)
Neutrophils Relative %: 64.4 % (ref 43.0–77.0)
Platelets: 512 10*3/uL — ABNORMAL HIGH (ref 150.0–400.0)
RBC: 4.1 Mil/uL (ref 3.87–5.11)
RDW: 12.1 % (ref 11.5–15.5)
WBC: 10.5 10*3/uL (ref 4.0–10.5)

## 2021-05-21 ENCOUNTER — Other Ambulatory Visit: Payer: Self-pay | Admitting: Family

## 2021-05-21 LAB — HEMOGLOBIN A1C
Hgb A1c MFr Bld: 9.7 % of total Hgb — ABNORMAL HIGH (ref <5.7)
Mean Plasma Glucose: 232 mg/dL
eAG (mmol/L): 12.8 mmol/L

## 2021-05-21 MED ORDER — GLIPIZIDE 5 MG PO TABS
5.0000 mg | ORAL_TABLET | Freq: Two times a day (BID) | ORAL | 3 refills | Status: DC
Start: 2021-05-21 — End: 2021-09-04

## 2021-05-22 ENCOUNTER — Other Ambulatory Visit: Payer: Self-pay

## 2021-05-22 NOTE — Progress Notes (Signed)
Rx has already been ordered

## 2021-06-12 ENCOUNTER — Ambulatory Visit: Payer: 59 | Admitting: Psychology

## 2021-07-10 ENCOUNTER — Ambulatory Visit: Payer: 59 | Admitting: Psychology

## 2021-07-21 ENCOUNTER — Ambulatory Visit (INDEPENDENT_AMBULATORY_CARE_PROVIDER_SITE_OTHER): Payer: 59 | Admitting: Psychology

## 2021-07-21 DIAGNOSIS — F411 Generalized anxiety disorder: Secondary | ICD-10-CM

## 2021-07-25 ENCOUNTER — Ambulatory Visit: Payer: 59 | Admitting: Family Medicine

## 2021-07-29 ENCOUNTER — Ambulatory Visit: Payer: 59 | Admitting: Internal Medicine

## 2021-08-07 ENCOUNTER — Ambulatory Visit: Payer: 59 | Admitting: Family Medicine

## 2021-08-19 ENCOUNTER — Ambulatory Visit: Payer: 59 | Admitting: Internal Medicine

## 2021-08-19 NOTE — Progress Notes (Deleted)
Name: Artis Buechele  MRN/ DOB: [id number], June 06, 1981    Age/ Sex: 40 y.o., female     PCP: Midge Minium, MD   Reason for Endocrinology Evaluation: Postablative Hypothyroidism     Initial Endocrinology Clinic Visit: 02/27/2020    PATIENT IDENTIFIER: Joanne Harris is a 40 y.o., female with a past medical history of T2DM, HTN and Hypothyroidism. She has followed with [facility] since 02/27/2020 for consultative assistance with management of her hypothyroidism.   HISTORICAL SUMMARY:  Pt has been diagnosed with hyperthyroidism in 2002, she is S/P RAI ablation followed by LT- 4 replacement .    Historically has had compliance issues, was discharged from Dr. Arman Filter care in 2018 due to noncompliance. Established care with me in 02/2020.   Grandmother with thyroid disease   SUBJECTIVE:    Today (08/19/2021):  Joanne Harris is here for postablative hypothyroidism.   She did not notice weight gain  Has been exhausted Denies constipation  Has depression   Has noted tingling of feet.     Levothyroxine 200 mcg daily   HISTORY:  Past Medical History:  Past Medical History:  Diagnosis Date   Depression    Diabetes mellitus    Hypothyroid    Leukocytosis 01/25/2020   Obesity    Thrombocytosis 01/25/2020   Past Surgical History:  Past Surgical History:  Procedure Laterality Date   CHOLECYSTECTOMY     Social History:  reports that she has never smoked. She has never used smokeless tobacco. She reports current alcohol use. She reports that she does not use drugs. Family History:  Family History  Problem Relation Age of Onset   Anemia Mother    Breast cancer Mother    Arthritis Mother    Hypertension Other        grandmother   Diabetes Other        grandmother   Stroke Other        grandfather   Breast cancer Other        grandmother, late 76s   Diabetes Maternal Grandmother    Breast cancer Maternal Grandmother      HOME  MEDICATIONS: Allergies as of 08/19/2021   No Known Allergies      Medication List        Accurate as of August 19, 2021  7:24 AM. If you have any questions, ask your nurse or doctor.          ALPRAZolam 0.5 MG tablet Commonly known as: XANAX TAKE 1 TABLET(0.5 MG) BY MOUTH TWICE DAILY AS NEEDED FOR ANXIETY   b complex vitamins tablet Take 1 tablet by mouth daily. Reported on 12/18/2015   FLUoxetine 40 MG capsule Commonly known as: PROZAC TAKE 1 CAPSULE(40 MG) BY MOUTH DAILY   glipiZIDE 5 MG tablet Commonly known as: GLUCOTROL Take 1 tablet (5 mg total) by mouth 2 (two) times daily before a meal.   glucose blood test strip Commonly known as: OneTouch Verio Use to test blood sugar 2 times daily as instructed.   levothyroxine 200 MCG tablet Commonly known as: SYNTHROID Take 1 tablet (200 mcg total) by mouth daily.   losartan 25 MG tablet Commonly known as: COZAAR TAKE 1 TABLET(25 MG) BY MOUTH DAILY   metFORMIN 1000 MG tablet Commonly known as: GLUCOPHAGE TAKE 1 TABLET(1000 MG) BY MOUTH TWICE DAILY WITH A MEAL   OneTouch Delica Lancets Fine Misc Use to test blood sugar 2 times daily as instructed.   triamcinolone ointment  0.1 % Commonly known as: KENALOG Apply 1 application topically 2 (two) times daily.   Vitamin D (Cholecalciferol) 25 MCG (1000 UT) Caps Take 2 capsules by mouth daily. Reported on 12/02/2015          OBJECTIVE:   PHYSICAL EXAM: VS: There were no vitals taken for this visit.   EXAM: General: Pt appears well and is in NAD  Neck: General: Supple without adenopathy. Thyroid:  No goiter or nodules appreciated.   Lungs: Clear with good BS bilat with no rales, rhonchi, or wheezes  Heart: Auscultation: RRR.  Abdomen: Normoactive bowel sounds, soft, nontender, without masses or organomegaly palpable  Extremities:  BL LE: No pretibial edema normal ROM and strength.  Mental Status: Judgment, insight: Intact Memory: Intact for recent and  remote events Mood and affect: No depression, anxiety, or agitation     DATA REVIEWED:  Results for Joanne, Harris (MRN [id number]) as of 03/25/2021 14:01  Ref. Range 03/25/2021 09:23  TSH Latest Ref Range: 0.35 - 4.50 uIU/mL 166.34 (H)     ASSESSMENT / PLAN / RECOMMENDATIONS:   Postablative Hypothyroidism:   - Chronic histotry of medication nonadherent and missed visitations.  - She has not had her levothyroxine in months due to lack of insurance and when she obtained health insurance she has not attempted to refill her levothyroxine.  - Discussed risk of myxedema coma with insufficient LT-4 intake  -- Pt educated extensively on the correct way to take levothyroxine (first thing in the morning with water, 30 minutes before eating or taking other medications). - Pt encouraged to double dose the following day if she were to miss a dose given long half-life of levothyroxine.   Medications   Start Levothyroxine 200 mcg daily   Labs in 8 weeks  F/U in 4 months    Signed electronically by: Mack Guise, MD  [facility]  [address] [place], [place] [address] Phone: [phone] FAX: [phone]      CC: Midge Minium, MD [address] [place] [address] Phone: [phone]  Fax: [phone]   Return to Endocrinology clinic as below: Future Appointments  Date Time Provider [place]  08/19/2021  8:50 AM Rickeya Manus, Melanie Crazier, MD [facility] [place]  08/28/2021  4:00 PM Bauert, Nicolasa Ducking, LCSW [facility] None  09/04/2021  9:30 AM Midge Minium, MD [facility]

## 2021-08-28 ENCOUNTER — Ambulatory Visit (INDEPENDENT_AMBULATORY_CARE_PROVIDER_SITE_OTHER): Payer: 59 | Admitting: Psychology

## 2021-08-28 DIAGNOSIS — F411 Generalized anxiety disorder: Secondary | ICD-10-CM | POA: Diagnosis not present

## 2021-09-04 ENCOUNTER — Encounter: Payer: Self-pay | Admitting: Family Medicine

## 2021-09-04 ENCOUNTER — Other Ambulatory Visit: Payer: 59

## 2021-09-04 ENCOUNTER — Ambulatory Visit (INDEPENDENT_AMBULATORY_CARE_PROVIDER_SITE_OTHER): Payer: 59 | Admitting: Family Medicine

## 2021-09-04 VITALS — BP 128/88 | HR 95 | Temp 97.2°F | Resp 17 | Wt 291.8 lb

## 2021-09-04 DIAGNOSIS — F341 Dysthymic disorder: Secondary | ICD-10-CM

## 2021-09-04 DIAGNOSIS — E1165 Type 2 diabetes mellitus with hyperglycemia: Secondary | ICD-10-CM | POA: Diagnosis not present

## 2021-09-04 DIAGNOSIS — E890 Postprocedural hypothyroidism: Secondary | ICD-10-CM | POA: Diagnosis not present

## 2021-09-04 LAB — BASIC METABOLIC PANEL
BUN: 11 mg/dL (ref 6–23)
CO2: 24 mEq/L (ref 19–32)
Calcium: 9.4 mg/dL (ref 8.4–10.5)
Chloride: 95 mEq/L — ABNORMAL LOW (ref 96–112)
Creatinine, Ser: 0.84 mg/dL (ref 0.40–1.20)
GFR: 87.81 mL/min (ref >60.00)
Glucose, Bld: 413 mg/dL — ABNORMAL HIGH (ref 70–99)
Potassium: 3.8 mEq/L (ref 3.5–5.1)
Sodium: 130 mEq/L — ABNORMAL LOW (ref 135–145)

## 2021-09-04 LAB — TSH: TSH: 136.62 u[IU]/mL — ABNORMAL HIGH (ref 0.35–5.50)

## 2021-09-04 LAB — T3, FREE: T3, Free: 2 pg/mL — ABNORMAL LOW (ref 2.3–4.2)

## 2021-09-04 LAB — T4, FREE: Free T4: 0.07 ng/dL — ABNORMAL LOW (ref 0.60–1.60)

## 2021-09-04 MED ORDER — TRAZODONE HCL 50 MG PO TABS: 25.0000 mg | ORAL_TABLET | Freq: Every evening | ORAL | 3 refills | Status: DC | PRN

## 2021-09-04 MED ORDER — SERTRALINE HCL 50 MG PO TABS: 50.0000 mg | ORAL_TABLET | Freq: Every day | ORAL | 3 refills | Status: DC

## 2021-09-04 MED ORDER — GLIPIZIDE 5 MG PO TABS: 5.0000 mg | ORAL_TABLET | Freq: Two times a day (BID) | ORAL | 1 refills | Status: DC

## 2021-09-04 MED ORDER — METFORMIN HCL 1000 MG PO TABS: ORAL_TABLET | ORAL | 1 refills | Status: DC

## 2021-09-04 NOTE — Addendum Note (Signed)
Addended by: Octavio Manns E on: 09/04/2021 04:29 PM   Modules accepted: Orders

## 2021-09-04 NOTE — Patient Instructions (Signed)
Follow up in 1 month to recheck mood and sleep We'll notify you of your lab results and make any changes if needed START the Sertraline once daily for mood USE the Trazodone nightly for sleep- start w/ 1/2 tab nightly Call w/ any questions or concerns Stay Safe!  Stay Healthy! Hang in there!!!

## 2021-09-04 NOTE — Addendum Note (Signed)
Addended by: Octavio Manns E on: 09/04/2021 04:25 PM   Modules accepted: Orders

## 2021-09-04 NOTE — Progress Notes (Signed)
   Subjective:    Patient ID: Joanne Harris, female    DOB: 12-02-1980, 40 y.o.   MRN: [id number]  HPI DM- chronic problem, on Glipizide 5mg  BID and Metformin 1000mg  BID.  On ARB for renal protection but pt reports she is not taking.  Stopped taking ~1 month ago due to GI side effects- abd pain and nausea. Due for eye exam.  UTD on foot exam.  Denies CP, SOB, HAs, visual changes.  No symptomatic lows.  + numbness/tingling of hands/feet w/ anxiety and intercourse.  Depression- stopped her Fluoxetine b/c she felt it wasn't working for her anxiety/depression.  Had a falling out w/ a close friend.  Has a counselor to talk to.  Is helping her brother care for his children.  + insomnia- both falling asleep and staying asleep.  Taking Advil PM.  Melatonin caused her to feel to groggy upon waking.  She is interested in starting Sertraline after talking w/ counselor.  PHQ=20 today  Hypothyroid- chronic problem.  Currently on Levothyroxine 228mcg daily.  + fatigue.  Has not seen Endo recently.  Due for repeat labs.  Review of Systems For ROS see HPI   This visit occurred during the SARS-CoV-2 public health emergency.  Safety protocols were in place, including screening questions prior to the visit, additional usage of staff PPE, and extensive cleaning of exam room while observing appropriate contact time as indicated for disinfecting solutions.      Objective:   Physical Exam Vitals reviewed.  Constitutional:      General: She is not in acute distress.    Appearance: She is well-developed. She is obese. She is not ill-appearing.  HENT:     Head: Normocephalic and atraumatic.  Eyes:     Extraocular Movements: Extraocular movements intact.     Conjunctiva/sclera: Conjunctivae normal.     Pupils: Pupils are equal, round, and reactive to light.     Comments: Bilateral exophthalmos  Neck:     Thyroid: Thyromegaly present.  Cardiovascular:     Rate and Rhythm: Normal rate and regular rhythm.      Pulses: Normal pulses.     Heart sounds: Normal heart sounds. No murmur heard. Pulmonary:     Effort: Pulmonary effort is normal. No respiratory distress.     Breath sounds: Normal breath sounds.  Abdominal:     General: There is no distension.     Palpations: Abdomen is soft.     Tenderness: There is no abdominal tenderness.  Musculoskeletal:     Cervical back: Normal range of motion and neck supple.     Right lower leg: No edema.     Left lower leg: No edema.  Lymphadenopathy:     Cervical: No cervical adenopathy.  Skin:    General: Skin is warm and dry.  Neurological:     Mental Status: She is alert and oriented to person, place, and time.  Psychiatric:        Behavior: Behavior normal.           Assessment & Plan:

## 2021-09-05 LAB — HEMOGLOBIN A1C
Hgb A1c MFr Bld: 14 % of total Hgb — ABNORMAL HIGH (ref <5.7)
Mean Plasma Glucose: 355 mg/dL
eAG (mmol/L): 19.7 mmol/L

## 2021-09-08 ENCOUNTER — Other Ambulatory Visit: Payer: Self-pay

## 2021-09-08 ENCOUNTER — Other Ambulatory Visit: Payer: 59

## 2021-09-08 DIAGNOSIS — E1165 Type 2 diabetes mellitus with hyperglycemia: Secondary | ICD-10-CM

## 2021-09-08 LAB — MICROALBUMIN / CREATININE URINE RATIO
Creatinine,U: 123.8 mg/dL
Microalb Creat Ratio: 0.6 mg/g (ref 0.0–30.0)
Microalb, Ur: <0.7 mg/dL (ref 0.0–1.9)

## 2021-10-07 ENCOUNTER — Ambulatory Visit (INDEPENDENT_AMBULATORY_CARE_PROVIDER_SITE_OTHER): Payer: 59 | Admitting: Family Medicine

## 2021-10-07 ENCOUNTER — Encounter: Payer: Self-pay | Admitting: Family Medicine

## 2021-10-07 VITALS — BP 122/88 | HR 97 | Temp 98.5°F | Resp 16 | Wt 294.8 lb

## 2021-10-07 DIAGNOSIS — E1165 Type 2 diabetes mellitus with hyperglycemia: Secondary | ICD-10-CM | POA: Diagnosis not present

## 2021-10-07 DIAGNOSIS — R058 Other specified cough: Secondary | ICD-10-CM | POA: Diagnosis not present

## 2021-10-07 DIAGNOSIS — F341 Dysthymic disorder: Secondary | ICD-10-CM

## 2021-10-07 MED ORDER — SERTRALINE HCL 100 MG PO TABS: 100.0000 mg | ORAL_TABLET | Freq: Every day | ORAL | 3 refills | Status: DC

## 2021-10-07 MED ORDER — ALBUTEROL SULFATE HFA 108 (90 BASE) MCG/ACT IN AERS: 2.0000 | INHALATION_SPRAY | Freq: Four times a day (QID) | RESPIRATORY_TRACT | 0 refills | Status: DC | PRN

## 2021-10-07 MED ORDER — ZOLPIDEM TARTRATE ER 6.25 MG PO TBCR: 6.2500 mg | EXTENDED_RELEASE_TABLET | Freq: Every evening | ORAL | 3 refills | Status: DC | PRN

## 2021-10-07 NOTE — Progress Notes (Signed)
° °  Subjective:    Patient ID: Joanne Harris, female    DOB: 04-Dec-1980, 40 y.o.   MRN: [id number]  HPI Depression/Anxiety- was started on Sertraline 50mg  at last visit and Trazodone as needed for sleep.  Some improvement since starting Sertraline PHQ went from 20 --> 13.  Trazodone was not effective.  Even when doubling the dose.  Pt has difficulty staying asleep and will find herself waking between 2-3am.  Has difficulty going back to sleep.  Sleep issues are occurring nightly.  Pt reports she is tolerating the Sertraline w/o difficulty.  Pt got let go from job.  DM- A1C last month was 14.  I do not see any upcoming appts scheduled w/ Endo despite informing pt that given her out of control diabetes and hypothyroid they need to be the ones managing this.  Cough- sxs started after Thanksgiving.  At night cough is severe.  Now has hoarseness.  Has been on abx and cough medicines.  Currently on Doxycyline but completes course today.  Denies wheezing or SOB.  No fevers, chills, body aches.   Review of Systems For ROS see HPI   This visit occurred during the SARS-CoV-2 public health emergency.  Safety protocols were in place, including screening questions prior to the visit, additional usage of staff PPE, and extensive cleaning of exam room while observing appropriate contact time as indicated for disinfecting solutions.      Objective:   Physical Exam Vitals reviewed.  Constitutional:      General: She is not in acute distress.    Appearance: Normal appearance. She is obese. She is not ill-appearing.  HENT:     Head: Normocephalic and atraumatic.  Eyes:     Comments: Mild proptosis  Cardiovascular:     Rate and Rhythm: Normal rate and regular rhythm.     Heart sounds: Normal heart sounds.  Pulmonary:     Effort: Pulmonary effort is normal. No respiratory distress.     Breath sounds: Normal breath sounds. No wheezing, rhonchi or rales.  Skin:    General: Skin is warm and dry.   Neurological:     General: No focal deficit present.     Mental Status: She is alert and oriented to person, place, and time.  Psychiatric:        Mood and Affect: Mood normal.        Behavior: Behavior normal.        Thought Content: Thought content normal.          Assessment & Plan:

## 2021-10-07 NOTE — Assessment & Plan Note (Signed)
Deteriorated.  A1C was month was 14.  Per Endo she has not been compliant w/ treatment or appts.  I suspect her depression has played a role in not taking care of herself.  I will refer back to Endo today b/c clearly she needs more control.  Pt expressed understanding and is in agreement w/ plan.

## 2021-10-07 NOTE — Patient Instructions (Signed)
Follow up in 6 weeks to recheck mood and sleep We'll call you with your Endocrinology appt b/c we need to get the sugar and thyroid under better control INCREASE the Sertraline to 100mg  daily- 2 of what you have at home and 1 of the new prescription TAKE the Ambien nightly for sleep.  Be aware of things that might be out of place that could signify sleep walking, etc The cough is due to airway inflammation.  Start taking Ibuprofen 2-3x/day and use the Albuterol inhaler to open up your airways Call with any questions or concerns Hang in there! Happy Holidays!!

## 2021-10-07 NOTE — Assessment & Plan Note (Signed)
Pt's PHQ has improved from 20-->13 since starting Sertraline.  She is tolerating this w/o difficulty so will increase to 100mg  daily and continue to monitor for improvement.  The trazodone was not effective for sleep- even at a higher dose- so will start Ambien CR since the trouble is staying asleep.  We discussed the possible side effects/complications of Ambien including sleep eating, walking, driving, etc.  She is aware and would like to proceed.  Will follow.

## 2021-10-13 ENCOUNTER — Ambulatory Visit: Payer: 59 | Admitting: Psychology

## 2021-11-18 ENCOUNTER — Ambulatory Visit: Payer: Self-pay | Admitting: Family Medicine

## 2021-12-22 ENCOUNTER — Ambulatory Visit: Payer: Self-pay | Admitting: Family Medicine

## 2022-02-26 ENCOUNTER — Ambulatory Visit: Payer: Self-pay | Admitting: Family Medicine

## 2022-03-18 ENCOUNTER — Ambulatory Visit: Payer: Self-pay | Admitting: Family Medicine

## 2022-04-15 ENCOUNTER — Ambulatory Visit: Payer: Self-pay | Admitting: Family Medicine

## 2022-04-21 ENCOUNTER — Ambulatory Visit: Payer: Self-pay | Admitting: Family Medicine

## 2022-04-29 ENCOUNTER — Ambulatory Visit: Payer: Self-pay | Admitting: Family Medicine

## 2022-06-01 ENCOUNTER — Other Ambulatory Visit: Payer: BC Managed Care – PPO

## 2022-06-01 ENCOUNTER — Encounter: Payer: Self-pay | Admitting: Family Medicine

## 2022-06-01 ENCOUNTER — Ambulatory Visit (INDEPENDENT_AMBULATORY_CARE_PROVIDER_SITE_OTHER): Payer: BC Managed Care – PPO | Admitting: Family Medicine

## 2022-06-01 VITALS — BP 134/82 | HR 95 | Temp 98.1°F | Resp 17 | Ht 64.5 in | Wt 314.0 lb

## 2022-06-01 DIAGNOSIS — E1165 Type 2 diabetes mellitus with hyperglycemia: Secondary | ICD-10-CM

## 2022-06-01 DIAGNOSIS — E890 Postprocedural hypothyroidism: Secondary | ICD-10-CM

## 2022-06-01 DIAGNOSIS — F341 Dysthymic disorder: Secondary | ICD-10-CM

## 2022-06-01 LAB — TSH: TSH: 156.75 u[IU]/mL — ABNORMAL HIGH (ref 0.35–5.50)

## 2022-06-01 LAB — VITAMIN D 25 HYDROXY (VIT D DEFICIENCY, FRACTURES): VITD: 13.57 ng/mL — ABNORMAL LOW (ref 30.00–100.00)

## 2022-06-01 LAB — LIPID PANEL
Cholesterol: 242 mg/dL — ABNORMAL HIGH (ref 0–200)
HDL: 71.9 mg/dL (ref >39.00)
LDL Cholesterol: 141 mg/dL — ABNORMAL HIGH (ref 0–99)
NonHDL: 170.15
Total CHOL/HDL Ratio: 3
Triglycerides: 146 mg/dL (ref 0.0–149.0)
VLDL: 29.2 mg/dL (ref 0.0–40.0)

## 2022-06-01 LAB — CBC WITH DIFFERENTIAL/PLATELET
Basophils Absolute: 0.1 10*3/uL (ref 0.0–0.1)
Basophils Relative: 0.7 % (ref 0.0–3.0)
Eosinophils Absolute: 0.2 10*3/uL (ref 0.0–0.7)
Eosinophils Relative: 2 % (ref 0.0–5.0)
HCT: 38 % (ref 36.0–46.0)
Hemoglobin: 12.7 g/dL (ref 12.0–15.0)
Lymphocytes Relative: 26.8 % (ref 12.0–46.0)
Lymphs Abs: 3.3 10*3/uL (ref 0.7–4.0)
MCHC: 33.3 g/dL (ref 30.0–36.0)
MCV: 100.9 fl — ABNORMAL HIGH (ref 78.0–100.0)
Monocytes Absolute: 0.6 10*3/uL (ref 0.1–1.0)
Monocytes Relative: 5.2 % (ref 3.0–12.0)
Neutro Abs: 8 10*3/uL — ABNORMAL HIGH (ref 1.4–7.7)
Neutrophils Relative %: 65.3 % (ref 43.0–77.0)
Platelets: 415 10*3/uL — ABNORMAL HIGH (ref 150.0–400.0)
RBC: 3.76 Mil/uL — ABNORMAL LOW (ref 3.87–5.11)
RDW: 13.2 % (ref 11.5–15.5)
WBC: 12.2 10*3/uL — ABNORMAL HIGH (ref 4.0–10.5)

## 2022-06-01 LAB — BASIC METABOLIC PANEL
BUN: 11 mg/dL (ref 6–23)
CO2: 25 mEq/L (ref 19–32)
Calcium: 9.7 mg/dL (ref 8.4–10.5)
Chloride: 96 mEq/L (ref 96–112)
Creatinine, Ser: 1.02 mg/dL (ref 0.40–1.20)
GFR: 69.2 mL/min (ref >60.00)
Glucose, Bld: 143 mg/dL — ABNORMAL HIGH (ref 70–99)
Potassium: 3.4 mEq/L — ABNORMAL LOW (ref 3.5–5.1)
Sodium: 136 mEq/L (ref 135–145)

## 2022-06-01 LAB — HEPATIC FUNCTION PANEL
ALT: 38 U/L — ABNORMAL HIGH (ref 0–35)
AST: 41 U/L — ABNORMAL HIGH (ref 0–37)
Albumin: 4.7 g/dL (ref 3.5–5.2)
Alkaline Phosphatase: 44 U/L (ref 39–117)
Bilirubin, Direct: 0.1 mg/dL (ref 0.0–0.3)
Total Bilirubin: 0.5 mg/dL (ref 0.2–1.2)
Total Protein: 8.3 g/dL (ref 6.0–8.3)

## 2022-06-01 MED ORDER — LEVOTHYROXINE SODIUM 200 MCG PO TABS: 200.0000 ug | ORAL_TABLET | Freq: Every day | ORAL | 3 refills | Status: DC

## 2022-06-01 MED ORDER — SERTRALINE HCL 100 MG PO TABS: 200.0000 mg | ORAL_TABLET | Freq: Every day | ORAL | 3 refills | Status: DC

## 2022-06-01 NOTE — Patient Instructions (Addendum)
Follow up in 1 month to recheck mood We'll notify you of your lab results and make any changes if needed Once we know that it's safe to start Trulicity, I will send in the prescription.  If it's not covered or too expensive, PLEASE let me know! RESTART the Levothyroxine 267mg daily.  Make sure you take this on an empty stomach- 30 minutes before eating or taking other medication Call and schedule your eye exam INCREASE the Sertraline to '200mg'$  daily- 2 of the '100mg'$  pills daily Call with any questions or concerns Hang in there!!

## 2022-06-01 NOTE — Progress Notes (Signed)
   Subjective:    Patient ID: Joanne Harris, female    DOB: [birth year], 41 y.o.   MRN: [id number]  HPI DM- chronic problem, on Metformin '1000mg'$  BID.  No longer taking Glipizide- 'it makes my stomach feel weird'.  Has gained 20 lbs.  Overdue for eye exam.  Due for foot exam.  UTD on microalbumin.  No CP, SOB, HAs, visual changes, abd pain, N/V.  Mild tingling in feet.  Denies symptomatic low.  Obesity- pt has gained 20 lbs since December.  Hypothyroid- pt has been off thyroid medication for ~5 months and has excessive fatigue, weight gain.  Depression/anxiety- Currently on Sertraline '100mg'$  daily and Ambien CR 6.'25mg'$  nightly.  Pt got laid off, then got a new job but had to quit.  Did get a new job that is good but it is demanding and she has been prioritizing work instead of health.  Pt admits depression is not well controlled.     Review of Systems For ROS see HPI     Objective:   Physical Exam Vitals reviewed.  Constitutional:      General: She is not in acute distress.    Appearance: She is well-developed. She is obese. She is not ill-appearing.  HENT:     Head: Normocephalic and atraumatic.  Eyes:     Extraocular Movements: Extraocular movements intact.     Conjunctiva/sclera: Conjunctivae normal.     Pupils: Pupils are equal, round, and reactive to light.     Comments: Exophthalmos  Neck:     Thyroid: No thyromegaly.  Cardiovascular:     Rate and Rhythm: Normal rate and regular rhythm.     Pulses: Normal pulses.     Heart sounds: Normal heart sounds. No murmur heard. Pulmonary:     Effort: Pulmonary effort is normal. No respiratory distress.     Breath sounds: Normal breath sounds.  Abdominal:     General: There is no distension.     Palpations: Abdomen is soft.     Tenderness: There is no abdominal tenderness.  Musculoskeletal:     Cervical back: Normal range of motion and neck supple.     Right lower leg: No edema.     Left lower leg: No edema.   Lymphadenopathy:     Cervical: No cervical adenopathy.  Skin:    General: Skin is warm and dry.  Neurological:     Mental Status: She is alert and oriented to person, place, and time.  Psychiatric:        Behavior: Behavior normal.     Comments: anxious          Assessment & Plan:

## 2022-06-02 ENCOUNTER — Telehealth: Payer: Self-pay

## 2022-06-02 ENCOUNTER — Other Ambulatory Visit: Payer: Self-pay

## 2022-06-02 LAB — HEMOGLOBIN A1C
Hgb A1c MFr Bld: 7.3 % of total Hgb — ABNORMAL HIGH (ref <5.7)
Mean Plasma Glucose: 163 mg/dL
eAG (mmol/L): 9 mmol/L

## 2022-06-02 MED ORDER — VITAMIN D (ERGOCALCIFEROL) 1.25 MG (50000 UNIT) PO CAPS: 50000.0000 [IU] | ORAL_CAPSULE | ORAL | 12 refills | Status: DC

## 2022-06-02 NOTE — Progress Notes (Signed)
See previous message

## 2022-06-02 NOTE — Telephone Encounter (Signed)
-----   Message from Midge Minium, MD sent at 06/02/2022  7:25 AM EDT ----- A1C has dropped from 14 --> 7.3%  That's FANTASTIC!!!  I think it would still be worthwhile to start Trulicity (or a similar weekly diabetes injection) to manage both sugar and help w/ weight loss, but please let me know how you feel about it

## 2022-06-03 ENCOUNTER — Encounter (INDEPENDENT_AMBULATORY_CARE_PROVIDER_SITE_OTHER): Payer: Self-pay

## 2022-06-20 NOTE — Assessment & Plan Note (Signed)
Chronic problem.  She has had issues w/ medication compliance in the past and has again been off medication for ~5 months.  She is having excessive fatigue and weight gain.  Check labs.  Adjust meds prn

## 2022-06-20 NOTE — Assessment & Plan Note (Signed)
Chronic problem.  Currently on Sertraline '100mg'$  daily and Ambien CR 6.'25mg'$ .  She admits depression is not well controlled.  Will increase Sertraline to '200mg'$  daily.  Pt expressed understanding and is in agreement w/ plan.

## 2022-06-20 NOTE — Assessment & Plan Note (Signed)
Deteriorated.  Pt has gained 20 lbs since December.  Stressed need for low carb diet and regular exercise.  Also discussed the need for her to take thyroid medication regularly.  Will continue to follow.

## 2022-06-20 NOTE — Assessment & Plan Note (Signed)
Chronic problem.  Currently on Metformin '1000mg'$  BID.  Stopped Glipizide due to GI side effects.  Foot exam done today.  Overdue for eye exam- pt to schedule.  Unfortunately has gained 20 lbs since last visit.  Check labs.  Adjust meds prn

## 2022-07-10 ENCOUNTER — Ambulatory Visit: Payer: BC Managed Care – PPO | Admitting: Family Medicine

## 2022-07-20 ENCOUNTER — Ambulatory Visit: Payer: BC Managed Care – PPO | Admitting: Family Medicine

## 2022-07-20 NOTE — Assessment & Plan Note (Signed)
Deteriorated.  Pt stopped her Fluoxetine b/c she didn't feel it was working.  Has been in counseling and counselor feels medication is indicated.  She is interested in starting Sertraline.  PHQ=20 today.  Will start Sertraline '50mg'$  daily as well as Trazodone for sleep.  Will follow closely.

## 2022-07-20 NOTE — Assessment & Plan Note (Signed)
Chronic problem.  Pt has had issues w/ compliance in the past.  Currently on Glipizide '5mg'$  BID and metformin '1000mg'$  BID.  She stopped her ARB 1 month ago due to GI side effects.  Due for eye exam.  UTD on foot exam.  Check labs.  Adjust meds prn

## 2022-07-20 NOTE — Assessment & Plan Note (Signed)
Ongoing issue for pt.  Currently on Levothyroxine 222mg daily but did not return to Endo for f/u.  Due for repeat labs.  Adjust meds prn.

## 2022-07-31 ENCOUNTER — Ambulatory Visit: Payer: BC Managed Care – PPO | Admitting: Family Medicine

## 2022-08-14 ENCOUNTER — Ambulatory Visit (INDEPENDENT_AMBULATORY_CARE_PROVIDER_SITE_OTHER): Payer: BC Managed Care – PPO | Admitting: Psychology

## 2022-08-14 DIAGNOSIS — F411 Generalized anxiety disorder: Secondary | ICD-10-CM

## 2022-08-14 NOTE — Progress Notes (Signed)
[facility] Counselor Initial Adult Exam  Name: Joanne Harris Date: 08/14/2022 MRN: [id number] DOB: [birth year] PCP: Midge Minium, MD  Time spent: 4:00pm-4:55pm   55 minutes  Guardian/Payee:  Crista Curb requested: No   Reason for Visit /Presenting Problem: Pt present for face-to-face initial assessment update via video Webex.  Pt consents to telehealth video session due to COVID 19 pandemic. Location of pt: home Location of therapist: home office.  Pt was last seen in therapy on 08/30/2021.   December 1st 2022 pt was laid off from her job.  That was a difficult time for pt.  She found another job the end of January but it was a "horrible job".  Pt left that job in March.  Pt did not have another job to go to and had to rely on her faith.   Pt was then hired by a company in [place].  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  There were some challenges with a couple of coworkers.   Pt was just let go from this job this week.  Pt feels like it was a wrongful termination and thinks her race could be a factor.   Pt feels increased anxiety about her job situation.   She has "what if" future fear thoughts.   Pt had to put her 40 yo cat down this summer.  She is grieving the loss of her pet.   Reviewed pt's treatment plan for annual update.  Updated pt's treatment plan and IA.   Pt participated in setting treatment goals.   Plan to meet every two weeks.    Mental Status Exam: Appearance:   Casual     Behavior:  Appropriate  Motor:  Normal  Speech/Language:   Normal Rate  Affect:  Appropriate  Mood:  normal  Thought process:  normal  Thought content:    WNL  Sensory/Perceptual disturbances:    WNL  Orientation:  oriented to person, place, time/date, and situation  Attention:  Good  Concentration:  Good  Memory:  WNL  Fund of knowledge:   Good  Insight:    Good  Judgment:   Good  Impulse Control:  Good    Reported Symptoms:   anxiety  Risk Assessment: Danger to Self:  No Self-injurious Behavior: No Danger to Others: No Duty to Warn:no Physical Aggression / Violence:No  Access to Firearms a concern: No  Gang Involvement:No  Patient / guardian was educated about steps to take if suicide or homicide risk level increases between visits: n/a While future psychiatric events cannot be accurately predicted, the patient does not currently require acute inpatient psychiatric care and does not currently meet [facility] involuntary commitment criteria.  Substance Abuse History: Current substance abuse: No     Past Psychiatric History:   Previous psychological history is significant for anxiety Outpatient Providers:pt has been in therapy in the past History of Psych Hospitalization: No  Psychological Testing:  n/a    Abuse History:  Victim of: No.,  n/a    Report needed: No. Victim of Neglect:No. Perpetrator of  n/a   Witness / Exposure to Domestic Violence: No   Protective Services Involvement: No  Witness to [employer] Violence:  No   Family History:  Family History  Problem Relation Age of Onset   Anemia Mother    Breast cancer Mother    Arthritis Mother    Hypertension Other  grandmother   Diabetes Other        grandmother   Stroke Other        grandfather   Breast cancer Other        grandmother, late 56s   Diabetes Maternal Grandmother    Breast cancer Maternal Grandmother     Living situation: the patient lives alone  Pt grew up with both parents in [place], and 2 older brothers.   Pt states that her childhood was good overall.   Pt's brother are 3 and 42 yrs older than her.   Parents are involved in church.   Pt has always had weight issues.  No family history of mental illness.  Pt uncle had alcohol issues but no other family history of substance.  No history of child abuse.    Sexual Orientation: Straight  Relationship Status: single  Name of spouse / other:n/a If a parent,  number of children / ages:n/a  Support Systems: lives alone  Financial Stress:  Yes   Income/Employment/Disability: Employment.   Pt was just fired from her job this week.   She has one more paycheck.  She has mobilized to look for another job and is working with a Human resources officer.  Pt has also filed for unemployment.   Pt's family is supportive of her and her parents can help her out financially if needed.    Military Service: No   Educational History: Education: Scientist, product/process development: Protestant  Any cultural differences that may affect / interfere with treatment:  not applicable   Recreation/Hobbies: reading  Stressors: Occupational concerns    Strengths: Supportive Relationships, Family, Spirituality, Conservator, museum/gallery, and Able to Communicate Effectively  Barriers:  none   Legal History: Pending legal issue / charges: The patient has no significant history of legal issues. History of legal issue / charges:  n/a  Medical History/Surgical History: reviewed Past Medical History:  Diagnosis Date   Depression    Diabetes mellitus    Hypothyroid    Leukocytosis 01/25/2020   Obesity    Thrombocytosis 01/25/2020    Past Surgical History:  Procedure Laterality Date   CHOLECYSTECTOMY      Medications: Current Outpatient Medications  Medication Sig Dispense Refill   albuterol (VENTOLIN HFA) 108 (90 Base) MCG/ACT inhaler Inhale 2 puffs into the lungs every 6 (six) hours as needed for wheezing or shortness of breath. 8 g 0   glucose blood (ONETOUCH VERIO) test strip Use to test blood sugar 2 times daily as instructed. 100 each 11   levothyroxine (SYNTHROID) 200 MCG tablet Take 1 tablet (200 mcg total) by mouth daily. 90 tablet 1   levothyroxine (SYNTHROID) 200 MCG tablet Take 1 tablet (200 mcg total) by mouth daily. 90 tablet 3   metFORMIN (GLUCOPHAGE) 1000 MG tablet TAKE 1 TABLET(1000 MG) BY MOUTH TWICE DAILY WITH A MEAL 180 tablet 1   ONETOUCH DELICA  LANCETS FINE MISC Use to test blood sugar 2 times daily as instructed. 100 each 11   sertraline (ZOLOFT) 100 MG tablet Take 2 tablets (200 mg total) by mouth daily. 60 tablet 3   Vitamin D, Cholecalciferol, 1000 UNITS CAPS Take 2 capsules by mouth daily. Reported on 12/02/2015     Vitamin D, Ergocalciferol, (DRISDOL) 1.25 MG (50000 UNIT) CAPS capsule Take 1 capsule (50,000 Units total) by mouth every 7 (seven) days. 7 capsule 12   zolpidem (AMBIEN CR) 6.25 MG CR tablet Take 1 tablet (6.25 mg total) by mouth at bedtime as needed  for sleep. 30 tablet 3   No current facility-administered medications for this visit.    No Known Allergies  Diagnoses:  F41.1  Plan of Care: Recommend ongoing therapy.   Pt participated in setting treatment goals.   Plan to meet every couple of weeks.    Treatment Plan  (treatment plan target date:  08/15/2023) Client Abilities/Strengths  Pt is bright, engaging and motivated for therapy.  Client Treatment Preferences  Individual therapy.  Client Statement of Needs  Improve coping skills.  Symptoms  Autonomic hyperactivity (e.g., palpitations, shortness of breath, dry mouth, trouble swallowing, nausea, diarrhea). Excessive and/or unrealistic worry that is difficult to control occurring more days than not for at least 6 months about a number of events or activities. Hypervigilance (e.g., feeling constantly on edge, experiencing concentration difficulties, having trouble falling or staying asleep, exhibiting a general state of irritability). Motor tension (e.g., restlessness, tiredness, shakiness, muscle tension). Problems Addressed  Anxiety Goals 1. Enhance ability to effectively cope with the full variety of life's worries and anxieties. 2. Learn and implement coping skills that result in a reduction of anxiety and worry, and improved daily functioning. Objective Learn to accept limitations in life and commit to tolerating, rather than avoiding, unpleasant  emotions while accomplishing meaningful goals. Target Date: 2023-08-15 Frequency: Biweekly Progress: 30 Modality: individual Related Interventions 1. Use techniques from Acceptance and Commitment Therapy to help client accept uncomfortable realities such as lack of complete control, imperfections, and uncertainty and tolerate unpleasant emotions and thoughts in order to accomplish value-consistent goals. Objective Learn and implement problem-solving strategies for realistically addressing worries. Target Date: 2023-08-15 Frequency: Biweekly Progress: 30 Modality: individual Related Interventions 1. Assign the client a homework exercise in which he/she problem-solves a current problem.  review, reinforce success, and provide corrective feedback toward improvement. 2. Teach the client problem-solving strategies involving specifically defining a problem, generating options for addressing it, evaluating the pros and cons of each option, selecting and implementing an optional action, and reevaluating and refining the action. Objective Learn and implement calming skills to reduce overall anxiety and manage anxiety symptoms. Target Date: 2023-08-15 Frequency: Biweekly Progress: 30 Modality: individual Related Interventions 1. Assign the client to read about progressive muscle relaxation and other calming strategies in relevant books or treatment manuals (e.g., Progressive Relaxation Training by Gwynneth Aliment and Dani Gobble; Mastery of Your Anxiety and Worry: Workbook by Beckie Busing). 2. Assign the client homework each session in which he/she practices relaxation exercises daily, gradually applying them progressively from non-anxiety-provoking to anxiety-provoking situations; review and reinforce success while providing corrective feedback toward improvement. 3. Teach the client calming/relaxation skills (e.g., applied relaxation, progressive muscle relaxation, cue controlled relaxation; mindful  breathing; biofeedback) and how to discriminate better between relaxation and tension; teach the client how to apply these skills to his/her daily life. 3. Reduce overall frequency, intensity, and duration of the anxiety so that daily functioning is not impaired. 4. Resolve the core conflict that is the source of anxiety. 5. Stabilize anxiety level while increasing ability to function on a daily basis. Diagnosis :    F41.1  Generalized Anxiety Disorder  Conditions For Discharge Achievement of treatment goals and objectives.      Pallavi Clifton, LCSW

## 2022-08-24 ENCOUNTER — Ambulatory Visit (INDEPENDENT_AMBULATORY_CARE_PROVIDER_SITE_OTHER): Payer: BC Managed Care – PPO | Admitting: Psychology

## 2022-08-24 DIAGNOSIS — F411 Generalized anxiety disorder: Secondary | ICD-10-CM

## 2022-08-24 NOTE — Progress Notes (Signed)
[facility] Counselor/Therapist Progress Note  Patient ID: Kalliope Riesen, MRN: [id number],    Date: 08/24/2022  Time Spent: 4:00pm - 4:50pm    50 minutes   Treatment Type: Individual Therapy  Reported Symptoms: stress, anxiety  Mental Status Exam: Appearance:  Casual     Behavior: Appropriate  Motor: Normal  Speech/Language:  Normal Rate  Affect: Appropriate  Mood: normal  Thought process: normal  Thought content:   WNL  Sensory/Perceptual disturbances:   WNL  Orientation: oriented to person, place, time/date, and situation  Attention: Good  Concentration: Good  Memory: WNL  Fund of knowledge:  Good  Insight:   Good  Judgment:  Good  Impulse Control: Good   Risk Assessment: Danger to Self:  No Self-injurious Behavior: No Danger to Others: No Duty to Warn:no Physical Aggression / Violence:No  Access to Firearms a concern: No  Gang Involvement:No   Subjective: Pt present for face-to-face individual therapy via video Webex.  Pt consents to telehealth video session due to COVID 19 pandemic. Location of pt: home Location of therapist: home office.  Pt talked about getting a new job.  She starts her job this Wednesday.   Pt is relieved that she will be employed again but is anxious about starting a new job.   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  She is use to working remotely so she needs to get use to working in person again.   Identified that it will be good for pt to be with people and not be so isolated.  Addressed pt's worries and worked on calming strategies.   Pt talked about having a hard year and having trouble letting go of the hurt.  Addressed how pt can work on letting go.  Encouraged her to rely on her faith.   Provided supportive therapy.     Interventions: Cognitive Behavioral Therapy and Insight-Oriented  Diagnosis:No diagnosis found.  Plan:  Plan of Care: Recommend ongoing therapy.   Pt participated in setting treatment  goals.   Plan to meet every couple of weeks.    Treatment Plan  (treatment plan target date:  08/15/2023) Client Abilities/Strengths  Pt is bright, engaging and motivated for therapy.  Client Treatment Preferences  Individual therapy.  Client Statement of Needs  Improve coping skills.  Symptoms  Autonomic hyperactivity (e.g., palpitations, shortness of breath, dry mouth, trouble swallowing, nausea, diarrhea). Excessive and/or unrealistic worry that is difficult to control occurring more days than not for at least 6 months about a number of events or activities. Hypervigilance (e.g., feeling constantly on edge, experiencing concentration difficulties, having trouble falling or staying asleep, exhibiting a general state of irritability). Motor tension (e.g., restlessness, tiredness, shakiness, muscle tension). Problems Addressed  Anxiety Goals 1. Enhance ability to effectively cope with the full variety of life's worries and anxieties. 2. Learn and implement coping skills that result in a reduction of anxiety and worry, and improved daily functioning. Objective Learn to accept limitations in life and commit to tolerating, rather than avoiding, unpleasant emotions while accomplishing meaningful goals. Target Date: 2023-08-15 Frequency: Biweekly Progress: 30 Modality: individual Related Interventions 1. Use techniques from Acceptance and Commitment Therapy to help client accept uncomfortable realities such as lack of complete control, imperfections, and uncertainty and tolerate unpleasant emotions and thoughts in order to accomplish value-consistent goals. Objective Learn and implement problem-solving strategies for realistically addressing worries. Target Date: 2023-08-15 Frequency: Biweekly Progress: 30 Modality: individual Related Interventions 1. Assign the client  a homework exercise in which he/she problem-solves a current problem.  review, reinforce success, and provide corrective  feedback toward improvement. 2. Teach the client problem-solving strategies involving specifically defining a problem, generating options for addressing it, evaluating the pros and cons of each option, selecting and implementing an optional action, and reevaluating and refining the action. Objective Learn and implement calming skills to reduce overall anxiety and manage anxiety symptoms. Target Date: 2023-08-15 Frequency: Biweekly Progress: 30 Modality: individual Related Interventions 1. Assign the client to read about progressive muscle relaxation and other calming strategies in relevant books or treatment manuals (e.g., Progressive Relaxation Training by Gwynneth Aliment and Dani Gobble; Mastery of Your Anxiety and Worry: Workbook by Beckie Busing). 2. Assign the client homework each session in which he/she practices relaxation exercises daily, gradually applying them progressively from non-anxiety-provoking to anxiety-provoking situations; review and reinforce success while providing corrective feedback toward improvement. 3. Teach the client calming/relaxation skills (e.g., applied relaxation, progressive muscle relaxation, cue controlled relaxation; mindful breathing; biofeedback) and how to discriminate better between relaxation and tension; teach the client how to apply these skills to his/her daily life. 3. Reduce overall frequency, intensity, and duration of the anxiety so that daily functioning is not impaired. 4. Resolve the core conflict that is the source of anxiety. 5. Stabilize anxiety level while increasing ability to function on a daily basis. Diagnosis :    F41.1  Generalized Anxiety Disorder  Conditions For Discharge Achievement of treatment goals and objectives.  Lauris Serviss, LCSW

## 2022-11-11 IMAGING — MG MM DIGITAL SCREENING BILAT W/ TOMO AND CAD
6 of 12 series · 6 of 36 positions shown · non-contrast
Comparison: Previous exam(s).

CLINICAL DATA: Screening.

EXAM:
DIGITAL SCREENING BILATERAL MAMMOGRAM WITH TOMOSYNTHESIS AND CAD
TECHNIQUE: Bilateral screening digital craniocaudal and mediolateral oblique
mammograms were obtained. Bilateral screening digital breast
tomosynthesis was performed. The images were evaluated with
computer-aided detection.

[R MLO synth-2D]
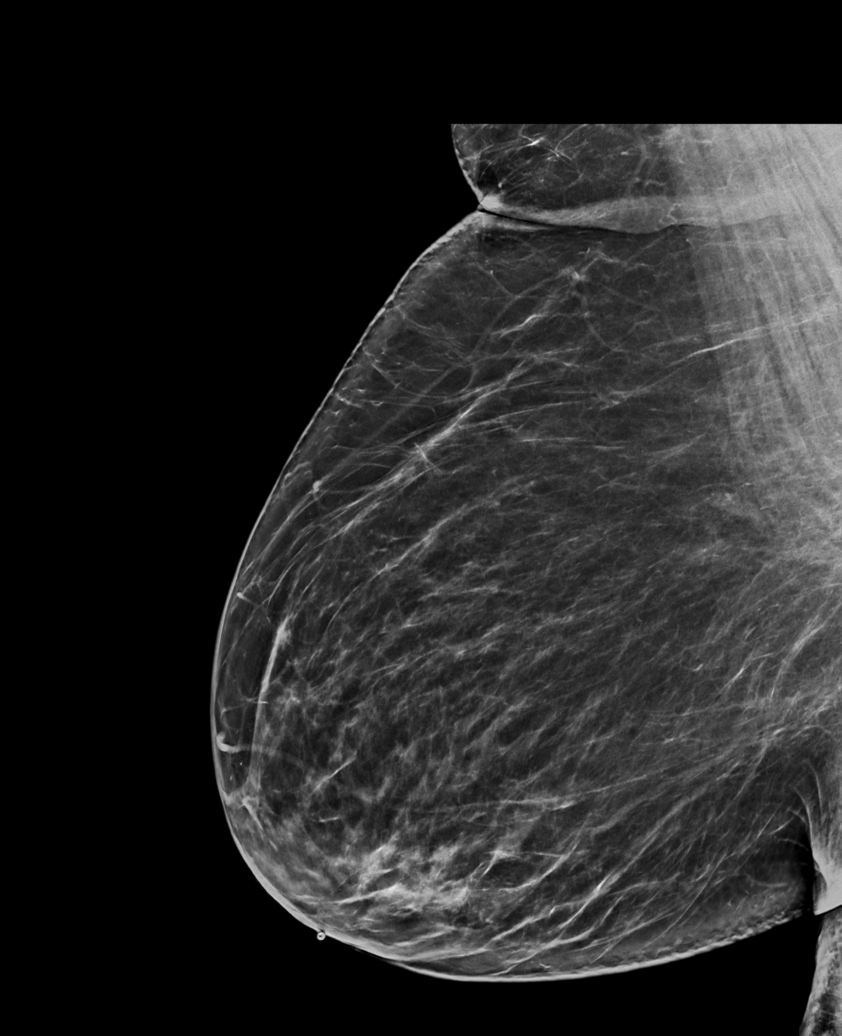

[L MLO synth-2D (1 of 2)]
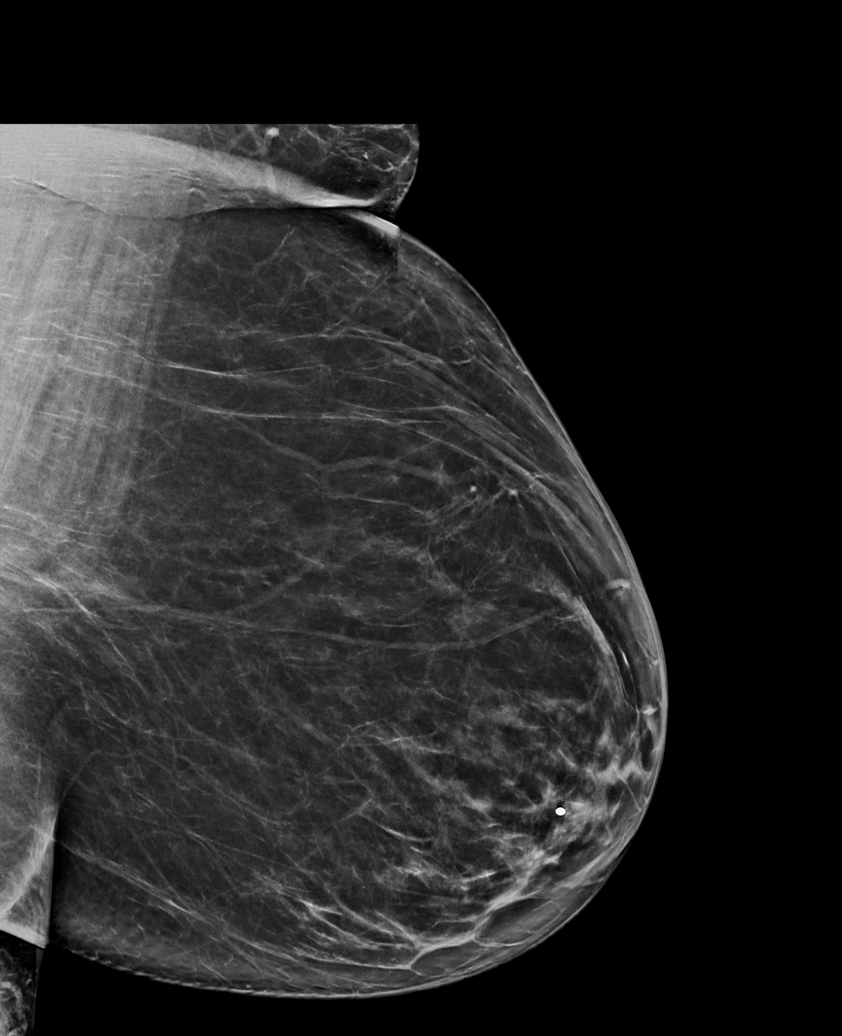

[L CC synth-2D]
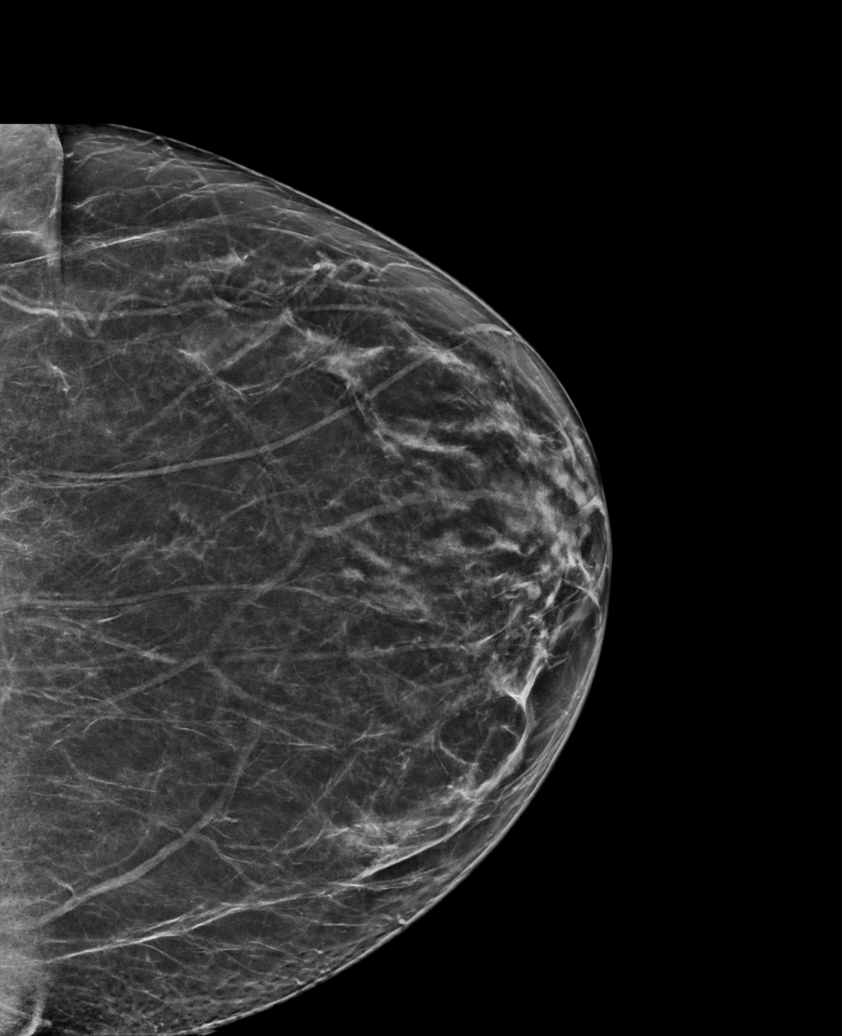

[L MLO synth-2D (2 of 2)]
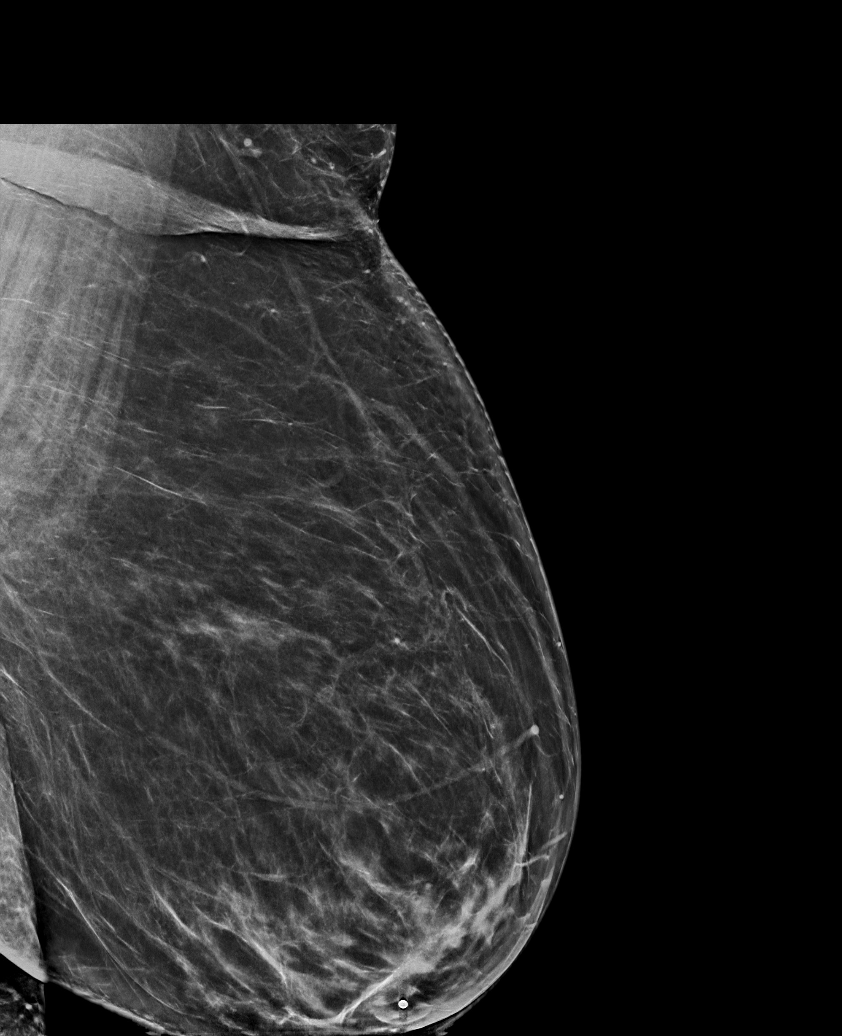

[R CC synth-2D]
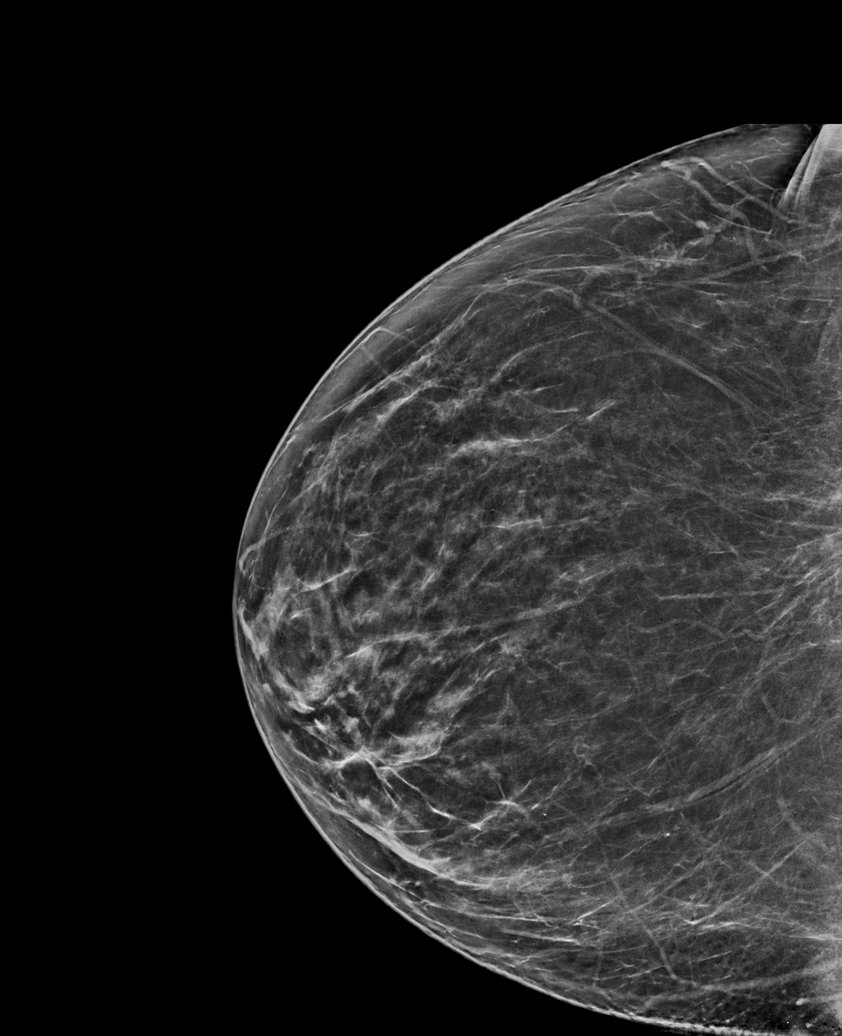

[R CV synth-2D]
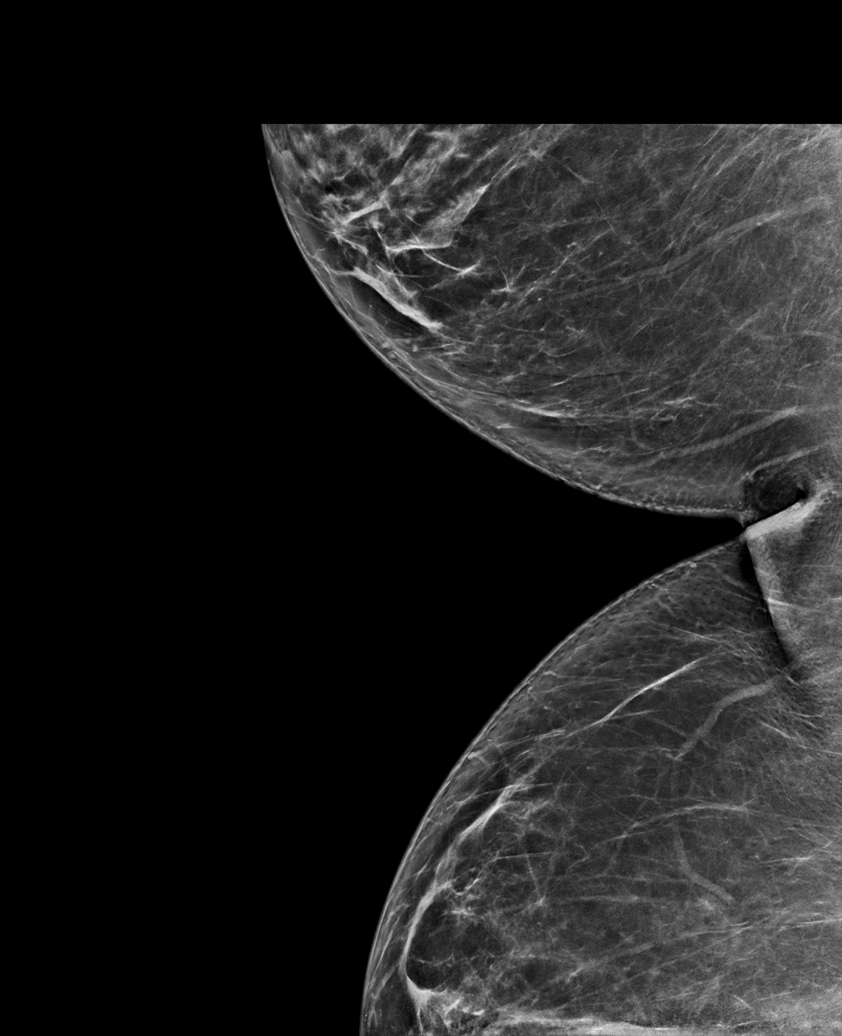

[6 of 36 positions shown; findings below may reference images not displayed]

ACR Breast Density Category b: There are scattered areas of
fibroglandular density.
FINDINGS: There are no findings suspicious for malignancy. The images were
evaluated with computer-aided detection.
IMPRESSION: No mammographic evidence of malignancy. A result letter of this
screening mammogram will be mailed directly to the patient.

RECOMMENDATION:
Screening mammogram at age 41. (Code:[id number]-[id number])

BI-RADS CATEGORY  1: Negative.

## 2022-11-20 ENCOUNTER — Ambulatory Visit: Payer: BC Managed Care – PPO | Admitting: Psychology

## 2022-12-17 ENCOUNTER — Ambulatory Visit: Payer: BC Managed Care – PPO | Admitting: Psychology

## 2023-02-05 ENCOUNTER — Ambulatory Visit: Payer: BC Managed Care – PPO | Admitting: Psychology

## 2023-02-05 DIAGNOSIS — F411 Generalized anxiety disorder: Secondary | ICD-10-CM

## 2023-05-03 ENCOUNTER — Other Ambulatory Visit: Payer: Self-pay | Admitting: Family Medicine

## 2023-05-03 DIAGNOSIS — Z1231 Encounter for screening mammogram for malignant neoplasm of breast: Secondary | ICD-10-CM

## 2023-05-26 ENCOUNTER — Ambulatory Visit
Admission: RE | Admit: 2023-05-26 | Discharge: 2023-05-26 | Disposition: A | Discharge status: Home / Self Care | Payer: 59 | Source: Ambulatory Visit | Attending: Family Medicine | Admitting: Family Medicine

## 2023-05-26 DIAGNOSIS — Z1231 Encounter for screening mammogram for malignant neoplasm of breast: Secondary | ICD-10-CM

## 2023-07-01 ENCOUNTER — Other Ambulatory Visit: Payer: Self-pay | Admitting: Family Medicine

## 2023-07-01 DIAGNOSIS — E890 Postprocedural hypothyroidism: Secondary | ICD-10-CM

## 2023-07-06 ENCOUNTER — Telehealth: Payer: Self-pay | Admitting: Family Medicine

## 2023-07-06 ENCOUNTER — Ambulatory Visit: Payer: 59 | Admitting: Family Medicine

## 2023-07-06 VITALS — BP 132/84 | HR 95 | Temp 97.8°F | Resp 19 | Ht 64.5 in | Wt 320.4 lb

## 2023-07-06 DIAGNOSIS — E1165 Type 2 diabetes mellitus with hyperglycemia: Secondary | ICD-10-CM | POA: Diagnosis not present

## 2023-07-06 DIAGNOSIS — Z7984 Long term (current) use of oral hypoglycemic drugs: Secondary | ICD-10-CM | POA: Diagnosis not present

## 2023-07-06 DIAGNOSIS — Z6841 Body Mass Index (BMI) 40.0 and over, adult: Secondary | ICD-10-CM

## 2023-07-06 DIAGNOSIS — E890 Postprocedural hypothyroidism: Secondary | ICD-10-CM | POA: Diagnosis not present

## 2023-07-06 LAB — BASIC METABOLIC PANEL
BUN: 13 mg/dL (ref 6–23)
CO2: 23 meq/L (ref 19–32)
Calcium: 9.2 mg/dL (ref 8.4–10.5)
Chloride: 97 meq/L (ref 96–112)
Creatinine, Ser: 0.89 mg/dL (ref 0.40–1.20)
GFR: 80.88 mL/min (ref >60.00)
Glucose, Bld: 167 mg/dL — ABNORMAL HIGH (ref 70–99)
Potassium: 3.8 meq/L (ref 3.5–5.1)
Sodium: 134 meq/L — ABNORMAL LOW (ref 135–145)

## 2023-07-06 LAB — HEPATIC FUNCTION PANEL
ALT: 28 U/L (ref 0–35)
AST: 35 U/L (ref 0–37)
Albumin: 4.1 g/dL (ref 3.5–5.2)
Alkaline Phosphatase: 52 U/L (ref 39–117)
Bilirubin, Direct: 0.1 mg/dL (ref 0.0–0.3)
Total Bilirubin: 0.7 mg/dL (ref 0.2–1.2)
Total Protein: 7.4 g/dL (ref 6.0–8.3)

## 2023-07-06 LAB — LIPID PANEL
Cholesterol: 267 mg/dL — ABNORMAL HIGH (ref 0–200)
HDL: 63 mg/dL (ref >39.00)
LDL Cholesterol: 155 mg/dL — ABNORMAL HIGH (ref 0–99)
NonHDL: 203.64
Total CHOL/HDL Ratio: 4
Triglycerides: 242 mg/dL — ABNORMAL HIGH (ref 0.0–149.0)
VLDL: 48.4 mg/dL — ABNORMAL HIGH (ref 0.0–40.0)

## 2023-07-06 LAB — POCT GLYCOSYLATED HEMOGLOBIN (HGB A1C): Hemoglobin A1C: 10.3 % — AB (ref 4.0–5.6)

## 2023-07-06 LAB — VITAMIN D 25 HYDROXY (VIT D DEFICIENCY, FRACTURES): VITD: 14.85 ng/mL — ABNORMAL LOW (ref 30.00–100.00)

## 2023-07-06 LAB — TSH: TSH: 113.03 u[IU]/mL — ABNORMAL HIGH (ref 0.35–5.50)

## 2023-07-06 NOTE — Assessment & Plan Note (Signed)
Deteriorated.  Pt has gained 6 lbs in the last year.  BMI now 54.  Not exercising, not following any particular diet and admits to not eating well.  Stressed need for both regular physical activity and better food choices.  Will follow.

## 2023-07-06 NOTE — Assessment & Plan Note (Signed)
Ongoing issue.  Pt has not been seen in 1 year despite being told to f/u every 3-4 months.  She reports taking Metformin but last prescription was written for 6 months back in 2022.  Due for eye exam- pt to schedule.  Foot exam done today.  Microalbumin and A1C ordered.  Admits she is not following a low carb diet, not exercising, and starting to develop numbness in feet.  Stressed need for glycemic control and routine f/u.  Will adjust meds based on lab results.

## 2023-07-06 NOTE — Assessment & Plan Note (Signed)
Ongoing issue for pt.  She admits she has not been taking her Levothyroxine regularly- despite being on daily.  TSH will likely be very high.  Discussed that this is a large part of her fatigue and she needs to get back on track.

## 2023-07-06 NOTE — Progress Notes (Signed)
   Subjective:    Patient ID: Joanne Harris, female    DOB: June 14, 1981, 42 y.o.   MRN: [id number]  HPI DM- chronic problem, on Metformin 1000mg  BID.  Due for eye exam, microalbumin, A1C.  No CP, SOB, HA's, visual changes, abd pain.  Some nausea, no vomiting.  Some numbness of feet bilaterally.  Hypothyroid- chronic problem, on Levothyroxine daily.  Very low energy.  Not taking levothyroxine regularly.    Obesity- pt has gained 6 lbs since last year.  No regular exercise.  Not following low carb diet.   Review of Systems For ROS see HPI     Objective:   Physical Exam Vitals reviewed.  Constitutional:      General: She is not in acute distress.    Appearance: Normal appearance. She is well-developed. She is obese. She is not ill-appearing.  HENT:     Head: Normocephalic and atraumatic.  Eyes:     Extraocular Movements: Extraocular movements intact.     Conjunctiva/sclera: Conjunctivae normal.     Pupils: Pupils are equal, round, and reactive to light.     Comments: + exophthalmos  Neck:     Thyroid: No thyromegaly.  Cardiovascular:     Rate and Rhythm: Normal rate and regular rhythm.     Pulses: Normal pulses.     Heart sounds: Normal heart sounds. No murmur heard. Pulmonary:     Effort: Pulmonary effort is normal. No respiratory distress.     Breath sounds: Normal breath sounds.  Abdominal:     General: There is no distension.     Palpations: Abdomen is soft.     Tenderness: There is no abdominal tenderness.  Musculoskeletal:     Cervical back: Normal range of motion and neck supple.     Right lower leg: No edema.     Left lower leg: No edema.  Lymphadenopathy:     Cervical: No cervical adenopathy.  Skin:    General: Skin is warm and dry.  Neurological:     General: No focal deficit present.     Mental Status: She is alert and oriented to person, place, and time.  Psychiatric:        Mood and Affect: Mood normal.        Behavior: Behavior normal.         Thought Content: Thought content normal.           Assessment & Plan:

## 2023-07-06 NOTE — Patient Instructions (Signed)
Follow up in 3-4 months to recheck sugars We'll notify you of your lab results and make any changes if needed Call and schedule your eye exam and have them send me a copy of the report Continue to work on low carb/low sugar diet and get regular physical activity Call with any questions or concerns Stay Safe!  Stay Healthy! Happy Fall!!!

## 2023-07-06 NOTE — Telephone Encounter (Signed)
Lab called stating that they could not run the CBC due to a clot. Also stated that they were unable to run the A1C, I told her I didn't see an A1C ordered for this patient, I saw a POCT A1C but not a lab draw.   Would you like this patient to come back in to redraw the CBC?

## 2023-07-07 NOTE — Telephone Encounter (Signed)
No need to repeat CBC.  Thank you for checking

## 2023-07-13 ENCOUNTER — Telehealth: Payer: Self-pay

## 2023-07-13 MED ORDER — TIRZEPATIDE 2.5 MG/0.5ML ~~LOC~~ SOAJ: 2.5000 mg | SUBCUTANEOUS | 0 refills | Status: DC

## 2023-07-13 MED ORDER — TIRZEPATIDE 5 MG/0.5ML ~~LOC~~ SOAJ: 5.0000 mg | SUBCUTANEOUS | 1 refills | Status: DC

## 2023-07-13 NOTE — Telephone Encounter (Signed)
I've been pondering what to do with these results for the past few days.... bc I want to take the next best steps towards getting you healthy and back on track.   Your A1C has jumped from 7.3 --> 10.3%  This indicates very poor sugar control.  Please continue your Metformin twice daily.  We are also going to add Mounjaro 2.5mg  weekly x4 weeks and then increase to 5mg  weekly (I have sent this to your local pharmacy)  Please be very mindful of your carb and sugar intake and try and get daily physical activity.   Your total cholesterol, triglycerides (fatty part of blood), and LDL (bad cholesterol) have all increased.  Based on this, we are going to need to start a cholesterol medication but I don't like to start 2 new meds at the same time.  So we will wait until after you've started the [facility] for the sugars.   Your TSH is again VERY high meaning that your thyroid is underfunctioning.  We will restart Levothyroxine daily (#30, 3 refills).  This needs to be taken on an empty stomach- 30 minutes before food or other medications.  We will repeat your TSH at a lab only visit in 1 month to determine if we need to adjust your dose (dx hypothyroid)   Your Vit D level is low.  Based on this, we need to start 50,000 units weekly x12 weeks in addition to daily OTC supplement of at least 2000 units.

## 2023-07-13 NOTE — Addendum Note (Signed)
Addended by: [name] on: 07/13/2023 07:35 AM   Modules accepted: Orders

## 2023-07-13 NOTE — Telephone Encounter (Addendum)
Tried calling patient with the results per Dr. Beverely Low. Could not leave a voice message. I will try calling patient again.   ----- Message from Neena Rhymes sent at 07/13/2023  7:34 AM EDT ----- Peggye Form been pondering what to do with these results for the past few days.... bc I want to take the next best steps towards getting you healthy and back on track.  Your A1C has jumped from 7.3 --> 10.3%  This indicates very poor sugar control.  Please continue your Metformin twice daily.  We are also going to add Mounjaro 2.5mg  weekly x4 weeks and then increase to 5mg  weekly (I have sent this to your local pharmacy)  Please be very mindful of your carb and sugar intake and try and get daily physical activity.  Your total cholesterol, triglycerides (fatty part of blood), and LDL (bad cholesterol) have all increased.  Based on this, we are going to need to start a cholesterol medication but I don't like to start 2 new meds at the same time.  So we will wait until after you've started the [facility] for the sugars.  Your TSH is again VERY high meaning that your thyroid is underfunctioning.  We will restart Levothyroxine daily (#30, 3 refills).  This needs to be taken on an empty stomach- 30 minutes before food or other medications.  We will repeat your TSH at a lab only visit in 1 month to determine if we need to adjust your dose (dx hypothyroid)  Your Vit D level is low.  Based on this, we need to start 50,000 units weekly x12 weeks in addition to daily OTC supplement of at least 2000 units.

## 2023-07-14 ENCOUNTER — Other Ambulatory Visit: Payer: Self-pay | Admitting: Family Medicine

## 2023-07-14 NOTE — Telephone Encounter (Signed)
Called this morning to discuss labs, unfortunately I was unable to leave a voicemail at this time

## 2023-07-15 ENCOUNTER — Encounter: Payer: Self-pay | Admitting: Family Medicine

## 2023-07-15 ENCOUNTER — Other Ambulatory Visit (HOSPITAL_COMMUNITY): Payer: Self-pay

## 2023-07-15 ENCOUNTER — Telehealth: Payer: Self-pay

## 2023-07-15 MED ORDER — LEVOTHYROXINE SODIUM 100 MCG PO TABS: 100.0000 ug | ORAL_TABLET | Freq: Every day | ORAL | 3 refills | Status: DC

## 2023-07-15 NOTE — Addendum Note (Signed)
Addended by: [name] on: 07/15/2023 04:19 PM   Modules accepted: Orders

## 2023-07-15 NOTE — Telephone Encounter (Signed)
Pharmacy Patient Advocate Encounter   Received notification from CoverMyMeds that prior authorization for Mounjaro 2.5MG /0.5ML pen-injectors is required/requested.   Insurance verification completed.   The patient is insured through [employer] .   Per test claim: APPROVED from 06/15/23 to 07/14/24. Ran test claim, Copay is $25. This test claim was processed through [facility]- copay amounts may vary at other pharmacies due to pharmacy/plan contracts, or as the patient moves through the different stages of their insurance plan.   KeyLowry Ram PA Case ID #: [id number]

## 2023-07-15 NOTE — Telephone Encounter (Signed)
Unable to contact letter has been mailed

## 2023-07-15 NOTE — Telephone Encounter (Signed)
Again unable to leave a VM

## 2023-07-16 ENCOUNTER — Other Ambulatory Visit: Payer: Self-pay

## 2023-07-16 ENCOUNTER — Inpatient Hospital Stay (HOSPITAL_BASED_OUTPATIENT_CLINIC_OR_DEPARTMENT_OTHER)
Admission: EM | Admit: 2023-07-16 | Discharge: 2023-07-21 | DRG: 287 | Disposition: A | Discharge status: Home / Self Care | Payer: 59 | Attending: Internal Medicine | Admitting: Internal Medicine

## 2023-07-16 ENCOUNTER — Telehealth: Payer: Self-pay | Admitting: Family Medicine

## 2023-07-16 ENCOUNTER — Emergency Department (HOSPITAL_BASED_OUTPATIENT_CLINIC_OR_DEPARTMENT_OTHER): Payer: 59

## 2023-07-16 ENCOUNTER — Encounter (HOSPITAL_BASED_OUTPATIENT_CLINIC_OR_DEPARTMENT_OTHER): Payer: Self-pay

## 2023-07-16 DIAGNOSIS — R0789 Other chest pain: Secondary | ICD-10-CM | POA: Diagnosis present

## 2023-07-16 DIAGNOSIS — Z8249 Family history of ischemic heart disease and other diseases of the circulatory system: Secondary | ICD-10-CM | POA: Diagnosis not present

## 2023-07-16 DIAGNOSIS — E039 Hypothyroidism, unspecified: Secondary | ICD-10-CM | POA: Diagnosis present

## 2023-07-16 DIAGNOSIS — Z79890 Hormone replacement therapy (postmenopausal): Secondary | ICD-10-CM

## 2023-07-16 DIAGNOSIS — I472 Ventricular tachycardia, unspecified: Secondary | ICD-10-CM | POA: Diagnosis not present

## 2023-07-16 DIAGNOSIS — Z6841 Body Mass Index (BMI) 40.0 and over, adult: Secondary | ICD-10-CM | POA: Diagnosis not present

## 2023-07-16 DIAGNOSIS — F32A Depression, unspecified: Secondary | ICD-10-CM | POA: Diagnosis not present

## 2023-07-16 DIAGNOSIS — I493 Ventricular premature depolarization: Secondary | ICD-10-CM | POA: Diagnosis not present

## 2023-07-16 DIAGNOSIS — E890 Postprocedural hypothyroidism: Secondary | ICD-10-CM | POA: Diagnosis present

## 2023-07-16 DIAGNOSIS — I428 Other cardiomyopathies: Secondary | ICD-10-CM | POA: Diagnosis not present

## 2023-07-16 DIAGNOSIS — E1165 Type 2 diabetes mellitus with hyperglycemia: Secondary | ICD-10-CM | POA: Diagnosis not present

## 2023-07-16 DIAGNOSIS — T381X6A Underdosing of thyroid hormones and substitutes, initial encounter: Secondary | ICD-10-CM | POA: Diagnosis not present

## 2023-07-16 DIAGNOSIS — F419 Anxiety disorder, unspecified: Secondary | ICD-10-CM | POA: Diagnosis not present

## 2023-07-16 DIAGNOSIS — Z7984 Long term (current) use of oral hypoglycemic drugs: Secondary | ICD-10-CM

## 2023-07-16 DIAGNOSIS — R059 Cough, unspecified: Secondary | ICD-10-CM | POA: Diagnosis present

## 2023-07-16 DIAGNOSIS — Z833 Family history of diabetes mellitus: Secondary | ICD-10-CM

## 2023-07-16 DIAGNOSIS — Z91148 Patient's other noncompliance with medication regimen for other reason: Secondary | ICD-10-CM

## 2023-07-16 DIAGNOSIS — R002 Palpitations: Secondary | ICD-10-CM | POA: Diagnosis present

## 2023-07-16 DIAGNOSIS — Z79899 Other long term (current) drug therapy: Secondary | ICD-10-CM | POA: Diagnosis not present

## 2023-07-16 DIAGNOSIS — I3139 Other pericardial effusion (noninflammatory): Secondary | ICD-10-CM | POA: Insufficient documentation

## 2023-07-16 DIAGNOSIS — Z5986 Financial insecurity: Secondary | ICD-10-CM

## 2023-07-16 DIAGNOSIS — Z91138 Patient's unintentional underdosing of medication regimen for other reason: Secondary | ICD-10-CM

## 2023-07-16 DIAGNOSIS — I471 Supraventricular tachycardia, unspecified: Secondary | ICD-10-CM | POA: Diagnosis not present

## 2023-07-16 DIAGNOSIS — I2510 Atherosclerotic heart disease of native coronary artery without angina pectoris: Secondary | ICD-10-CM | POA: Diagnosis not present

## 2023-07-16 DIAGNOSIS — F341 Dysthymic disorder: Secondary | ICD-10-CM | POA: Diagnosis not present

## 2023-07-16 DIAGNOSIS — Z91199 Patient's noncompliance with other medical treatment and regimen due to unspecified reason: Secondary | ICD-10-CM

## 2023-07-16 DIAGNOSIS — Z7985 Long-term (current) use of injectable non-insulin antidiabetic drugs: Secondary | ICD-10-CM

## 2023-07-16 DIAGNOSIS — R000 Tachycardia, unspecified: Principal | ICD-10-CM | POA: Diagnosis present

## 2023-07-16 DIAGNOSIS — I110 Hypertensive heart disease with heart failure: Secondary | ICD-10-CM | POA: Diagnosis present

## 2023-07-16 DIAGNOSIS — R7989 Other specified abnormal findings of blood chemistry: Secondary | ICD-10-CM

## 2023-07-16 DIAGNOSIS — I10 Essential (primary) hypertension: Secondary | ICD-10-CM | POA: Diagnosis present

## 2023-07-16 LAB — BASIC METABOLIC PANEL
Anion gap: 15 (ref 5–15)
BUN: 15 mg/dL (ref 6–20)
CO2: 19 mmol/L — ABNORMAL LOW (ref 22–32)
Calcium: 9 mg/dL (ref 8.9–10.3)
Chloride: 101 mmol/L (ref 98–111)
Creatinine, Ser: 1.1 mg/dL — ABNORMAL HIGH (ref 0.44–1.00)
GFR, Estimated: >60 mL/min (ref >60)
Glucose, Bld: 248 mg/dL — ABNORMAL HIGH (ref 70–99)
Potassium: 3.5 mmol/L (ref 3.5–5.1)
Sodium: 135 mmol/L (ref 135–145)

## 2023-07-16 LAB — TROPONIN I (HIGH SENSITIVITY)
Troponin I (High Sensitivity): 61 ng/L — ABNORMAL HIGH (ref <18)
Troponin I (High Sensitivity): 60 ng/L — ABNORMAL HIGH (ref <18)

## 2023-07-16 LAB — CBC
HCT: 38 % (ref 36.0–46.0)
HCT: 36.4 % (ref 36.0–46.0)
Hemoglobin: 12.9 g/dL (ref 12.0–15.0)
Hemoglobin: 12.1 g/dL (ref 12.0–15.0)
MCH: 32.9 pg (ref 26.0–34.0)
MCH: 32.5 pg (ref 26.0–34.0)
MCHC: 33.9 g/dL (ref 30.0–36.0)
MCHC: 33.2 g/dL (ref 30.0–36.0)
MCV: 96.9 fL (ref 80.0–100.0)
MCV: 97.8 fL (ref 80.0–100.0)
Platelets: 395 10*3/uL (ref 150–400)
Platelets: 373 10*3/uL (ref 150–400)
RBC: 3.92 MIL/uL (ref 3.87–5.11)
RBC: 3.72 MIL/uL — ABNORMAL LOW (ref 3.87–5.11)
RDW: 13.2 % (ref 11.5–15.5)
RDW: 13.2 % (ref 11.5–15.5)
WBC: 12.9 10*3/uL — ABNORMAL HIGH (ref 4.0–10.5)
WBC: 10.4 10*3/uL (ref 4.0–10.5)
nRBC: 0 % (ref 0.0–0.2)
nRBC: 0 % (ref 0.0–0.2)

## 2023-07-16 LAB — CREATININE, SERUM
Creatinine, Ser: 0.96 mg/dL (ref 0.44–1.00)
GFR, Estimated: >60 mL/min (ref >60)

## 2023-07-16 LAB — BRAIN NATRIURETIC PEPTIDE: B Natriuretic Peptide: 428 pg/mL — ABNORMAL HIGH (ref 0.0–100.0)

## 2023-07-16 LAB — T4, FREE: Free T4: <0.25 ng/dL — ABNORMAL LOW (ref 0.61–1.12)

## 2023-07-16 LAB — D-DIMER, QUANTITATIVE: D-Dimer, Quant: 0.28 ug/mL-FEU (ref 0.00–0.50)

## 2023-07-16 LAB — HIV ANTIBODY (ROUTINE TESTING W REFLEX): HIV Screen 4th Generation wRfx: NONREACTIVE

## 2023-07-16 LAB — MAGNESIUM: Magnesium: 2 mg/dL (ref 1.7–2.4)

## 2023-07-16 LAB — TSH: TSH: 121.225 u[IU]/mL — ABNORMAL HIGH (ref 0.350–4.500)

## 2023-07-16 MED ORDER — LEVALBUTEROL HCL 0.63 MG/3ML IN NEBU: 0.6300 mg | INHALATION_SOLUTION | Freq: Four times a day (QID) | RESPIRATORY_TRACT | Status: DC | PRN

## 2023-07-16 MED ORDER — SODIUM CHLORIDE 0.9% FLUSH
3.0000 mL | Freq: Two times a day (BID) | INTRAVENOUS | Status: DC
  Administered 2023-07-16 – 2023-07-20 (×6): 3 mL via INTRAVENOUS

## 2023-07-16 MED ORDER — POLYETHYLENE GLYCOL 3350 17 G PO PACK: 17.0000 g | PACK | Freq: Every day | ORAL | Status: DC | PRN

## 2023-07-16 MED ORDER — SODIUM CHLORIDE 0.9% FLUSH: 3.0000 mL | INTRAVENOUS | Status: DC | PRN

## 2023-07-16 MED ORDER — ACETAMINOPHEN 325 MG PO TABS: 650.0000 mg | ORAL_TABLET | Freq: Four times a day (QID) | ORAL | Status: DC | PRN

## 2023-07-16 MED ORDER — ETOMIDATE 2 MG/ML IV SOLN
10.0000 mg | Freq: Once | INTRAVENOUS | Status: AC
  Administered 2023-07-16: 10 mg via INTRAVENOUS
  Filled 2023-07-16: qty 10

## 2023-07-16 MED ORDER — ENOXAPARIN SODIUM 40 MG/0.4ML IJ SOSY: 40.0000 mg | PREFILLED_SYRINGE | INTRAMUSCULAR | Status: DC

## 2023-07-16 MED ORDER — ENOXAPARIN SODIUM 80 MG/0.8ML IJ SOSY
70.0000 mg | PREFILLED_SYRINGE | INTRAMUSCULAR | Status: DC
  Administered 2023-07-16 – 2023-07-20 (×4): 70 mg via SUBCUTANEOUS
  Filled 2023-07-16 (×5): qty 0.8

## 2023-07-16 MED ORDER — HYDRALAZINE HCL 20 MG/ML IJ SOLN
10.0000 mg | INTRAMUSCULAR | Status: DC | PRN
  Administered 2023-07-17: 10 mg via INTRAVENOUS
  Filled 2023-07-16: qty 1

## 2023-07-16 MED ORDER — POTASSIUM CHLORIDE CRYS ER 20 MEQ PO TBCR
40.0000 meq | EXTENDED_RELEASE_TABLET | Freq: Once | ORAL | Status: AC
  Administered 2023-07-16: 40 meq via ORAL
  Filled 2023-07-16: qty 2

## 2023-07-16 MED ORDER — ACETAMINOPHEN 650 MG RE SUPP: 650.0000 mg | Freq: Four times a day (QID) | RECTAL | Status: DC | PRN

## 2023-07-16 MED ORDER — SODIUM CHLORIDE 0.9 % IV SOLN: 250.0000 mL | INTRAVENOUS | Status: DC | PRN

## 2023-07-16 MED ORDER — LEVOTHYROXINE SODIUM 50 MCG PO TABS
50.0000 ug | ORAL_TABLET | Freq: Every day | ORAL | Status: DC
  Administered 2023-07-17 – 2023-07-21 (×5): 50 ug via ORAL
  Filled 2023-07-16 (×5): qty 1

## 2023-07-16 MED ORDER — ONDANSETRON HCL 4 MG/2ML IJ SOLN
4.0000 mg | Freq: Once | INTRAMUSCULAR | Status: AC
  Administered 2023-07-16: 4 mg via INTRAVENOUS
  Filled 2023-07-16: qty 2

## 2023-07-16 MED ORDER — INSULIN ASPART 100 UNIT/ML IJ SOLN
0.0000 [IU] | Freq: Three times a day (TID) | INTRAMUSCULAR | Status: DC
  Administered 2023-07-18 (×2): 1 [IU] via SUBCUTANEOUS
  Administered 2023-07-18 – 2023-07-19 (×2): 2 [IU] via SUBCUTANEOUS
  Administered 2023-07-20 (×3): 1 [IU] via SUBCUTANEOUS
  Administered 2023-07-21: 2 [IU] via SUBCUTANEOUS

## 2023-07-16 MED ORDER — MELATONIN 5 MG PO TABS
5.0000 mg | ORAL_TABLET | Freq: Every evening | ORAL | Status: DC | PRN
  Administered 2023-07-16 – 2023-07-20 (×5): 5 mg via ORAL
  Filled 2023-07-16 (×5): qty 1

## 2023-07-16 NOTE — H&P (Signed)
History and Physical    Patient: Joanne Harris ZOX:[id number] DOB: 07-14-1981 DOA: 07/16/2023 DOS: the patient was seen and examined on 07/16/2023 PCP: [name], MD  Patient coming from: Home  Chief Complaint:  Chief Complaint  Patient presents with   Shortness of Breath   HPI: Joanne Harris is a 42 y.o. female with medical history significant of DM-2, post ablative hypothyroidism, anxiety-who started having palpitations yesterday around 3 PM-presented to med [facility] for further evaluation.  Per patient-she has been in her usual state of health for the past several days-no major issues-she has had some dry cough-has had intermittent diarrhea (claims to have IBS).  Due to insurance issues-she was not taking any of her thyroid/diabetes medications for approximately 4 months-and had just gotten them filled yesterday, in fact her first dose of her levothyroxine was this morning.  Per patient-while at work around 3 PM-she walked to the bathroom-and then was noted to have palpitations.  This is associated with mild shortness of breath and some intermittent chest pain.  Chest pain was in the left side, without any aggravating or relieving factors-there is no radiation of the pain-pain was described as sharp.  She noted that her heart rate varied between 40s to 150s in her Apple Watch.  She thought that her symptoms would resolve spontaneously-however when she woke up this morning-she continued to have persistent symptoms-hence she presented to med [facility].  At [facility] she was found to have wide-complex tachycardia-given the fact that she was having shortness of breath/chest pain-she underwent synchronized cardioversion with restoration of sinus rhythm.  Patient was then referred to [facility] hospitalist service for further evaluation and treatment.  Cardiology was also consulted by emergency department physician.  No fever No headache No abdominal  pain No nausea, vomiting No dysuria or hematuria  + Dry cough  Review of Systems: As mentioned in the history of present illness. All other systems reviewed and are negative. Past Medical History:  Diagnosis Date   Depression    Diabetes mellitus    Hypothyroid    Leukocytosis 01/25/2020   Obesity    Thrombocytosis 01/25/2020   Past Surgical History:  Procedure Laterality Date   CHOLECYSTECTOMY     Social History:  reports that she has never smoked. She has never used smokeless tobacco. She reports current alcohol use. She reports that she does not use drugs.  No Known Allergies  Family History  Problem Relation Age of Onset   Anemia Mother    Breast cancer Mother    Arthritis Mother    Hypertension Other        grandmother   Diabetes Other        grandmother   Stroke Other        grandfather   Breast cancer Other        grandmother, late 44s   Diabetes Maternal Grandmother    Breast cancer Maternal Grandmother     Prior to Admission medications   Medication Sig Start Date End Date Taking? Authorizing Provider  levothyroxine (SYNTHROID) 100 MCG tablet Take 1 tablet (100 mcg total) by mouth daily. 07/15/23  Yes [name], MD  sertraline (ZOLOFT) 100 MG tablet TAKE 1 TABLET(100 MG) BY MOUTH DAILY 07/14/23  Yes [name], MD  tirzepatide [facility]) 2.5 MG/0.5ML Pen Inject 2.5 mg into the skin once a week. 07/13/23  Yes [name], MD  glucose blood (ONETOUCH VERIO) test strip Use to  test blood sugar 2 times daily as instructed. 11/05/14   Carlus Pavlov, MD  [facility] DELICA LANCETS FINE MISC Use to test blood sugar 2 times daily as instructed. 11/05/14   Carlus Pavlov, MD    Physical Exam: Vitals:   07/16/23 1415 07/16/23 1430 07/16/23 1443 07/16/23 1623  BP:   (!) 158/129 (!) 142/97  Pulse: 81 85 88 88  Resp: 12 14 16 16   Temp:   98.3 F (36.8 C) 98.6 F (37 C)  TempSrc:   Oral Oral  SpO2: (!) 87% 98% 100%   Weight:    (!) 145.2 kg   Height:    5' 3.5" (1.613 m)   Gen Exam:Alert awake-not in any distress HEENT:atraumatic, normocephalic Chest: B/L clear to auscultation anteriorly CVS:S1S2 regular Abdomen:soft non tender, non distended Extremities:no edema Neurology: Non focal Skin: no rash  Data Reviewed:    Latest Ref Rng & Units 07/16/2023   10:12 AM 06/01/2022    9:29 AM 05/20/2021    9:01 AM  CBC  WBC 4.0 - 10.5 K/uL 12.9  12.2  10.5   Hemoglobin 12.0 - 15.0 g/dL 47.8  29.5  62.1   Hematocrit 36.0 - 46.0 % 38.0  38.0  39.9   Platelets 150 - 400 K/uL 395  415.0  512.0        Latest Ref Rng & Units 07/16/2023   10:12 AM 07/06/2023    9:27 AM 06/01/2022    9:29 AM  BMP  Glucose 70 - 99 mg/dL 308  657  846   BUN 6 - 20 mg/dL 15  13  11    Creatinine 0.44 - 1.00 mg/dL 9.62  9.52  8.41   Sodium 135 - 145 mmol/L 135  134  136   Potassium 3.5 - 5.1 mmol/L 3.5  3.8  3.4   Chloride 98 - 111 mmol/L 101  97  96   CO2 22 - 32 mmol/L 19  23  25    Calcium 8.9 - 10.3 mg/dL 9.0  9.2  9.7       Assessment and Plan: Wide-complex tachycardia Likely SVT with aberrancy or VT. 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 Will be restarting her thyroid medications at a lower dose given wide-complex tachycardia today Cardiology consulted-will await further recommendations No family history of sudden death/CAD-however brother was recently diagnosed with A-fib. Monitor in telemetry.  DM-2 Just restarted taking metformin yesterday-after a gap of 3 to 4 months Check A1c with a.m. labs SSI for now Follow CBG trend  Hypothyroidism Noncompliant to Synthroid due to financial issues-in fact just restarted  Synthroid 100 mcg this morning after a gap of 3-4 months. Given the fact that she has had wide-complex tachycardia-will reduce Synthroid to 50 mcg-to be started from tomorrow. Unlikely-wide-complex tachycardia was related to reinitiation of Synthroid but concerned that this may be a manifestation of apathetic hypothyroidism-although she is relatively young.   Advance Care Planning:   Code Status: Full Code   Consults: Cards  Family Communication: None at bedside  Severity of Illness: The appropriate patient status for this patient is OBSERVATION. Observation status is judged to be reasonable and necessary in order to provide the required intensity of service to  ensure the patient's safety. The patient's presenting symptoms, physical exam findings, and initial radiographic and laboratory data in the context of their medical condition is felt to place them at decreased risk for further clinical deterioration. Furthermore, it is anticipated that the patient will be medically stable for discharge from the hospital within 2 midnights of admission.   Author: Jeoffrey Massed, MD 07/16/2023 5:12 PM  For on call review [email or web address].

## 2023-07-16 NOTE — ED Notes (Signed)
Attempted to call report to RN on floor. RN is at lunch. Charge RN will have nurse call this Clinical research associate back for report

## 2023-07-16 NOTE — ED Notes (Signed)
ED TO INPATIENT HANDOFF REPORT  ED Nurse Name and Phone #: Madysen Faircloth [place] [phone]  S Name/Age/Gender Joanne Harris 42 y.o. female Room/Bed: MHOTF/OTF  Code Status   Code Status: Not on file  Home/SNF/Other Home Patient oriented to: self, place, time, and situation Is this baseline? Yes   Triage Complete: Triage complete  Chief Complaint Tachyarrhythmia [R00.0]  Triage Note Patient here POV from Home.  Endorses worsening SOB since 1500 yesterday. Upper Back and Chest Pain. Present mostly constantly but worse with exertion.   Some Cough. No Fevers.   NAD Noted during Triage. A&Ox4. CGS 15. Ambulatory.   Allergies No Known Allergies  Level of Care/Admitting Diagnosis ED Disposition     ED Disposition  Admit   Condition  --   Comment  Hospital Area: [facility] [[id number]]  Level of Care: Telemetry Cardiac [103]  Interfacility transfer: Yes  May place patient in observation at [facility] or [facility] if equivalent level of care is available:: No  Covid Evaluation: Confirmed COVID Negative  Diagnosis: Tachyarrhythmia [109323]  Admitting Physician: Burnadette Pop [[id number]]  Attending Physician: Loetta Rough [[id number]]          B Medical/Surgery History Past Medical History:  Diagnosis Date   Depression    Diabetes mellitus    Hypothyroid    Leukocytosis 01/25/2020   Obesity    Thrombocytosis 01/25/2020   Past Surgical History:  Procedure Laterality Date   CHOLECYSTECTOMY       A IV Location/Drains/Wounds Patient Lines/Drains/Airways Status     Active Line/Drains/Airways     Name Placement date Placement time Site Days   Peripheral IV 07/16/23 18 G 1.16" Right;Anterior;Proximal Arm 07/16/23  1019  Arm  less than 1   Peripheral IV 07/16/23 18 G 1.88" Anterior;Left;Proximal Forearm 07/16/23  1026  Forearm  less than 1            Intake/Output Last 24 hours No intake or output data in the 24 hours ending 07/16/23  1524  Labs/Imaging Results for orders placed or performed during the hospital encounter of 07/16/23 (from the past 48 hour(s))  Basic metabolic panel     Status: Abnormal   Collection Time: 07/16/23 10:12 AM  Result Value Ref Range   Sodium 135 135 - 145 mmol/L   Potassium 3.5 3.5 - 5.1 mmol/L   Chloride 101 98 - 111 mmol/L   CO2 19 (L) 22 - 32 mmol/L   Glucose, Bld 248 (H) 70 - 99 mg/dL    Comment: Glucose reference range applies only to samples taken after fasting for at least 8 hours.   BUN 15 6 - 20 mg/dL   Creatinine, Ser 2.37 (H) 0.44 - 1.00 mg/dL   Calcium 9.0 8.9 - 62.8 mg/dL   GFR, Estimated >31 >51 mL/min    Comment: (NOTE) Calculated using the CKD-EPI Creatinine Equation (2021)    Anion gap 15 5 - 15    Comment: Performed at [facility], [address] [place], [place] [address]  CBC     Status: Abnormal   Collection Time: 07/16/23 10:12 AM  Result Value Ref Range   WBC 12.9 (H) 4.0 - 10.5 K/uL   RBC 3.92 3.87 - 5.11 MIL/uL   Hemoglobin 12.9 12.0 - 15.0 g/dL   HCT 73.7 10.6 - 26.9 %   MCV 96.9 80.0 - 100.0 fL   MCH 32.9 26.0 - 34.0 pg   MCHC 33.9 30.0 - 36.0 g/dL  RDW 13.2 11.5 - 15.5 %   Platelets 395 150 - 400 K/uL   nRBC 0.0 0.0 - 0.2 %    Comment: Performed at [facility], [address] [place], [place] [address]  Magnesium     Status: None   Collection Time: 07/16/23 10:12 AM  Result Value Ref Range   Magnesium 2.0 1.7 - 2.4 mg/dL    Comment: Performed at [facility], [address] [place], [place] [address]  Brain natriuretic peptide     Status: Abnormal   Collection Time: 07/16/23 10:12 AM  Result Value Ref Range   B Natriuretic Peptide 428.0 (H) 0.0 - 100.0 pg/mL    Comment: Performed at [facility], [address] [place], [place] [address]  D-dimer, quantitative     Status: None   Collection Time: 07/16/23 10:12 AM  Result Value Ref Range   D-Dimer, Quant 0.28 0.00 - 0.50 ug/mL-FEU     Comment: (NOTE) At the manufacturer cut-off value of 0.5 g/mL FEU, this assay has a negative predictive value of 95-100%.This assay is intended for use in conjunction with a clinical pretest probability (PTP) assessment model to exclude pulmonary embolism (PE) and deep venous thrombosis (DVT) in outpatients suspected of PE or DVT. Results should be correlated with clinical presentation. Performed at [facility], [address], [place], [place] [address]    DG Chest Portable 1 View  Result Date: 07/16/2023 CLINICAL DATA:  Chest pain.  Shortness of breath. EXAM: PORTABLE CHEST 1 VIEW COMPARISON:  None Available. FINDINGS: Low lung volume. Bilateral lung fields are clear. Bilateral lateral costophrenic angles are clear. Mildly enlarged cardio-mediastinal silhouette, even considering portable AP technique. No acute osseous abnormalities. The soft tissues are within normal limits. IMPRESSION: 1. Mild cardiomegaly. Electronically Signed   By: Jules Schick M.D.   On: 07/16/2023 11:38    Pending Labs Unresulted Labs (From admission, onward)     Start     Ordered   07/16/23 1257  T3, free  Add-on,   AD        07/16/23 1257   07/16/23 1005  TSH  Once,   URGENT        07/16/23 1004   07/16/23 1005  T4, free  Once,   URGENT        07/16/23 1004   07/16/23 1003  Pregnancy, urine  Once,   URGENT       Comments: If UA unable to be obtained, obtain hcg serum qualitative lab test    07/16/23 1002            Vitals/Pain Today's Vitals   07/16/23 1415 07/16/23 1430 07/16/23 1443 07/16/23 1446  BP:   (!) 158/129   Pulse: 81 85 88   Resp: 12 14 16    Temp:   98.3 F (36.8 C)   TempSrc:   Oral   SpO2: (!) 87% 98% 100%   Weight:      Height:      PainSc:   0-No pain 0-No pain    Isolation Precautions No active isolations  Medications Medications  ondansetron (ZOFRAN) injection 4 mg (4 mg Intravenous Given 07/16/23 1054)  etomidate (AMIDATE) injection 10 mg (10 mg  Intravenous Given 07/16/23 1102)    Mobility walks     Focused Assessments Cardiac Assessment Handoff:  Cardiac Rhythm: Normal sinus rhythm No results found for: "CKTOTAL", "CKMB", "CKMBINDEX", "TROPONINI" Lab Results  Component Value Date  DDIMER 0.28 07/16/2023   Does the Patient currently have chest pain? No    R Recommendations: See Admitting Provider Note  Report given to: Thayer Ohm, RN  Additional Notes:  Pt reported to ED with c/o SOB since yesterday. Pt very weak upon arrival. Pt HR noted to be 140. Pt alert and oriented x 4. Pt was sedated in the ER for a cardioversion. After cardioversion pt noted to be NSR. No complaints of pain. Pt is on room air. VS stable. Pt has IV access x 2.

## 2023-07-16 NOTE — Telephone Encounter (Signed)
Patient Name First: Joanne Males Last: Harris Gender: Female DOB: 02-Oct-1981 Age: 42 Y 10 M Return Phone Number: [phone] (Primary) Address: City/ State/ Zip: [place]  [address] Client [facility] - Cli Client Site [facility] - Day Provider Lezlie Octave- MD Contact Type Call Who Is Calling Patient / Member / Family / Caregiver Call Type Triage / Clinical Relationship To Patient Self Return Phone Number [phone] (Primary) Chief Complaint BREATHING - shortness of breath or sounds breathless Reason for Call Symptomatic / Request for Health Information Initial Comment Caller states that she is short of breath and she cannot walk far. This started around 3pm yesterday. Translation No Nurse Assessment Nurse: D'Heur Ezzard Standing, RN, Adrienne Date/Time (Eastern Time): 07/16/2023 8:55:58 AM Confirm and document reason for call. If symptomatic, describe symptoms. ---Caller states that she is short of breath and she cannot walk far. This started around 3 p.m. yesterday. She has been coughing lately. (dry cough). Her thyroid level was low (results last Tuesday) and she re-started it yesterday (Levothyroxine 100 mcg). It was after she started the medication that she noticed symptoms beginning. No fever. She feels some upper back pain. Chest pain in the sternal area in the middle when she walks, goes away when she is at rest. Does the patient have any new or worsening symptoms? ---Yes Will a triage be completed? ---Yes Related visit to physician within the last 2 weeks? ---No Does the PT have any chronic conditions? (i.e. diabetes, asthma, this includes High risk factors for pregnancy, etc.) ---Yes List chronic conditions. ---hypothyroid, diabetes Is the patient pregnant or possibly pregnant? (Ask all females between the ages of 46-55) ---No Is this a behavioral health or substance abuse call? ---No PLEASE NOTE:  All timestamps contained within this report are represented as [place] Standard Time. CONFIDENTIALTY NOTICE: This fax transmission is intended only for the addressee. It contains information that is legally privileged, confidential or otherwise protected from use or disclosure. If you are not the intended recipient, you are strictly prohibited from reviewing, disclosing, copying using or disseminating any of this information or taking any action in reliance on or regarding this information. If you have received this fax in error, please notify us immediately by telephone so that we can arrange for its return to [place]. Phone: [phone], Toll-Free: [phone], Fax: [phone] Page: 2 of 2 Call Id: [id number]  Try to call before end of day

## 2023-07-16 NOTE — Consult Note (Signed)
Cardiology Consultation   Patient ID: Joanne Harris MRN: [id number]; DOB: 1980-12-18  Admit date: 07/16/2023 Date of Consult: 07/16/2023  PCP:  [name], MD   [facility] Providers Cardiologist:  None }     History of Present Illness:   [name] is a 42 yo female with history of DM, hypothyroidism and obesity who presented to the [facility] ED with c/w dyspnea and upper back pain for 24 hours. She reported not taking Synthroid for months until yesterday. Echo in 2021 with normal LV function, moderate LVH. When she presented to the ED she was found to have a wide complex tachycardia. She was cardioverted to sinus rhythm. She was found to have TSH of 121 and T4 less than 0.25. BNP 428. She is now being transferred to [facility] to the Medicine service for further evaluation of her profound hypothyroidism. Cardiology is consulted for evaluation of her arrhythmia.   She tells me that she feels much better now. No dyspnea or chest pain at rest.    Past Medical History:  Diagnosis Date   Depression    Diabetes mellitus    Hypothyroid    Leukocytosis 01/25/2020   Obesity    Thrombocytosis 01/25/2020    Past Surgical History:  Procedure Laterality Date   CHOLECYSTECTOMY       Home Medications:  Prior to Admission medications   Medication Sig Start Date End Date Taking? Authorizing Provider  levothyroxine (SYNTHROID) 100 MCG tablet Take 1 tablet (100 mcg total) by mouth daily. 07/15/23  Yes [name], MD  sertraline (ZOLOFT) 100 MG tablet TAKE 1 TABLET(100 MG) BY MOUTH DAILY 07/14/23  Yes [name], MD  tirzepatide [facility]) 2.5 MG/0.5ML Pen Inject 2.5 mg into the skin once a week. 07/13/23  Yes [name], MD  glucose blood (ONETOUCH VERIO) test strip Use to test blood sugar 2 times daily as instructed. 11/05/14   Carlus Pavlov, MD  [facility] DELICA LANCETS FINE MISC Use to test blood sugar 2 times daily as instructed.  11/05/14   Carlus Pavlov, MD    Inpatient Medications: Scheduled Meds:  enoxaparin (LOVENOX) injection  40 mg Subcutaneous Q24H   [START ON 07/17/2023] insulin aspart  0-9 Units Subcutaneous TID WC   [START ON 07/17/2023] levothyroxine  50 mcg Oral Daily   potassium chloride  40 mEq Oral Once   sodium chloride flush  3 mL Intravenous Q12H   Continuous Infusions:  sodium chloride     PRN Meds: sodium chloride, acetaminophen **OR** acetaminophen, hydrALAZINE, levalbuterol, polyethylene glycol, sodium chloride flush  Allergies:   No Known Allergies  Social History:   Social History   Socioeconomic History   Marital status: Single    Spouse name: Not on file   Number of children: Not on file   Years of education: Not on file   Highest education level: Master's degree (e.g., MA, MS, MEng, MEd, MSW, MBA)  Occupational History   Not on file  Tobacco Use   Smoking status: Never   Smokeless tobacco: Never  Vaping Use   Vaping status: Never Used  Substance and Sexual Activity   Alcohol use: Yes    Comment: occ.   Drug use: No   Sexual activity: Not on file  Other Topics Concern   Not on file  Social History Narrative   Not on file   Social Determinants of Health   Financial Resource Strain: Low Risk  (07/06/2023)   Overall Financial Resource  Strain (CARDIA)    Difficulty of Paying Living Expenses: Not very hard  Food Insecurity: No Food Insecurity (07/06/2023)   Hunger Vital Sign    Worried About Running Out of Food in the Last Year: Never true    Ran Out of Food in the Last Year: Never true  Transportation Needs: No Transportation Needs (07/06/2023)   PRAPARE - Administrator, Civil Service (Medical): No    Lack of Transportation (Non-Medical): No  Physical Activity: Insufficiently Active (07/06/2023)   Exercise Vital Sign    Days of Exercise per Week: 2 days    Minutes of Exercise per Session: 30 min  Stress: Stress Concern Present (07/06/2023)   [employer] of Occupational Health - Occupational Stress Questionnaire    Feeling of Stress : Very much  Social Connections: Moderately Integrated (07/06/2023)   Social Connection and Isolation Panel [NHANES]    Frequency of Communication with Friends and Family: More than three times a week    Frequency of Social Gatherings with Friends and Family: Once a week    Attends Religious Services: More than 4 times per year    Active Member of [employer] or Organizations: Yes    Attends Engineer, structural: More than 4 times per year    Marital Status: Never married  Catering manager Violence: Not on file    Family History:    Family History  Problem Relation Age of Onset   Anemia Mother    Breast cancer Mother    Arthritis Mother    Hypertension Other        grandmother   Diabetes Other        grandmother   Stroke Other        grandfather   Breast cancer Other        grandmother, late 42s   Diabetes Maternal Grandmother    Breast cancer Maternal Grandmother      ROS:  Please see the history of present illness.  All other ROS reviewed and negative.     Physical Exam/Data:   Vitals:   07/16/23 1415 07/16/23 1430 07/16/23 1443 07/16/23 1623  BP:   (!) 158/129 (!) 142/97  Pulse: 81 85 88 88  Resp: 12 14 16 16   Temp:   98.3 F (36.8 C) 98.6 F (37 C)  TempSrc:   Oral Oral  SpO2: (!) 87% 98% 100%   Weight:    (!) 145.2 kg  Height:    5' 3.5" (1.613 m)   No intake or output data in the 24 hours ending 07/16/23 1731    07/16/2023    4:23 PM 07/16/2023    9:55 AM 07/06/2023    9:11 AM  Last 3 Weights  Weight (lbs) 320 lb 320 lb 320 lb 6 oz  Weight (kg) 145.151 kg 145.151 kg 145.321 kg     Body mass index is 55.8 kg/m.  General:  Well nourished, well developed, in no acute distress HEENT: normal Neck: no JVD Vascular: No carotid bruits; Distal pulses 2+ bilaterally Cardiac:  normal S1, S2; RRR; no murmur  Lungs:  clear to auscultation bilaterally, no wheezing,  rhonchi or rales  Abd: soft, nontender, no hepatomegaly  Ext: no edema Musculoskeletal:  No deformities, BUE and BLE strength normal and equal Skin: warm and dry  Neuro:  CNs 2-12 intact, no focal abnormalities noted Psych:  Normal affect   EKG:  The EKG was personally reviewed and demonstrates:  Latest EKG with Sinus with  PVCs, QTc 517 Telemetry:  Telemetry was personally reviewed and demonstrates:  Sinus  Relevant CV Studies:   Laboratory Data:  High Sensitivity Troponin:  No results for input(s): "TROPONINIHS" in the last 720 hours.   Chemistry Recent Labs  Lab 07/16/23 1012  NA 135  K 3.5  CL 101  CO2 19*  GLUCOSE 248*  BUN 15  CREATININE 1.10*  CALCIUM 9.0  MG 2.0  GFRNONAA >60  ANIONGAP 15    No results for input(s): "PROT", "ALBUMIN", "AST", "ALT", "ALKPHOS", "BILITOT" in the last 168 hours. Lipids No results for input(s): "CHOL", "TRIG", "HDL", "LABVLDL", "LDLCALC", "CHOLHDL" in the last 168 hours.  Hematology Recent Labs  Lab 07/16/23 1012  WBC 12.9*  RBC 3.92  HGB 12.9  HCT 38.0  MCV 96.9  MCH 32.9  MCHC 33.9  RDW 13.2  PLT 395   Thyroid  Recent Labs  Lab 07/16/23 1012  TSH 121.225*  FREET4 <0.25*    BNP Recent Labs  Lab 07/16/23 1012  BNP 428.0*    DDimer  Recent Labs  Lab 07/16/23 1012  DDIMER 0.28     Radiology/Studies:  DG Chest Portable 1 View  Result Date: 07/16/2023 CLINICAL DATA:  Chest pain.  Shortness of breath. EXAM: PORTABLE CHEST 1 VIEW COMPARISON:  None Available. FINDINGS: Low lung volume. Bilateral lung fields are clear. Bilateral lateral costophrenic angles are clear. Mildly enlarged cardio-mediastinal silhouette, even considering portable AP technique. No acute osseous abnormalities. The soft tissues are within normal limits. IMPRESSION: 1. Mild cardiomegaly. Electronically Signed   By: Jules Schick M.D.   On: 07/16/2023 11:38     Assessment and Plan:   Wide complex tachycardia: She was cardioverted to sinus.  Her rhythm could represent atrial flutter with aberrancy vs VT. Her arrhythmia is likely driven by her acute thyroid issues. If she has recurrent arrhythmias, would start amiodarone but I would not start right now. Primary team will manage her thyroid disease. Echo is pending. Monitor on telemetry. We will follow along with you. Will have EP review  her strips tomorrow.    For questions or updates, please contact  [facility] Please consult [email or web address] for contact info under    Signed, Verne Carrow, MD  07/16/2023 5:31 PM

## 2023-07-16 NOTE — ED Provider Notes (Signed)
[place] [facility] DEPARTMENT AT [facility] Provider Note   CSN: [id number] Arrival date & time: 07/16/23  1478     History  Chief Complaint  Patient presents with   Shortness of Breath    Joanne Harris is a 42 y.o. female with PMH as listed below who presents with worsening SOB since 1500 yesterday.  Associated with constant upper back and chest pain.  No history of similar.  She denies any recent fevers chills, flulike symptoms, cough, abdominal pain, nausea vomiting diarrhea constipation, symptoms are worse with exertion.  Per chart review patient has history of thyroid disease as a teenager and had an ablation, unclear exactly which thyroid disease she had.  She states she had been taking Synthroid for years until she was between insurance providers and stopped going to the doctor, stopped picking up her medicines for approximately 4 months.  Just restarted her synthroid yesterday at 100 mcg PO qd.   Per chart review, echocardiogram in 04/14/2020 demonstrated moderate LVH, normal right and left ventricular function, trivial MR.  Past Medical History:  Diagnosis Date   Depression    Diabetes mellitus    Hypothyroid    Leukocytosis 01/25/2020   Obesity    Thrombocytosis 01/25/2020       Home Medications Prior to Admission medications   Medication Sig Start Date End Date Taking? Authorizing Provider  albuterol (VENTOLIN HFA) 108 (90 Base) MCG/ACT inhaler Inhale 2 puffs into the lungs every 6 (six) hours as needed for wheezing or shortness of breath. 10/07/21   [name], MD  glucose blood (ONETOUCH VERIO) test strip Use to test blood sugar 2 times daily as instructed. 11/05/14   Carlus Pavlov, MD  levothyroxine (SYNTHROID) 100 MCG tablet Take 1 tablet (100 mcg total) by mouth daily. 07/15/23   [name], MD  metFORMIN (GLUCOPHAGE) 1000 MG tablet TAKE 1 TABLET(1000 MG) BY MOUTH TWICE DAILY WITH A MEAL 09/04/21   [name], MD   [facility] DELICA LANCETS FINE MISC Use to test blood sugar 2 times daily as instructed. 11/05/14   Carlus Pavlov, MD  sertraline (ZOLOFT) 100 MG tablet TAKE 1 TABLET(100 MG) BY MOUTH DAILY 07/14/23   [name], MD  tirzepatide [facility]) 2.5 MG/0.5ML Pen Inject 2.5 mg into the skin once a week. 07/13/23   [name], MD  tirzepatide [facility]) 5 MG/0.5ML Pen Inject 5 mg into the skin once a week. 07/13/23   [name], MD  Vitamin D, Cholecalciferol, 1000 UNITS CAPS Take 2 capsules by mouth daily. Reported on 12/02/2015    [provider]      Allergies    Patient has no known allergies.    Review of Systems   Review of Systems A 10 point review of systems was performed and is negative unless otherwise reported in HPI.  Physical Exam Updated Vital Signs BP 125/82   Pulse 82   Temp 98.2 F (36.8 C) (Oral)   Resp 10   Ht 5\' 4"  (1.626 m)   Wt (!) 145.2 kg   SpO2 95%   BMI 54.93 kg/m  Physical Exam General: Uncomfortable appearing obese female, lying in bed.  HEENT: PERRLA, Significant proptosis. Sclera anicteric, MMM, trachea midline. Mildly enlarged thyroid palpable in neck.  Cardiology: Regular tachycardic rate, no murmurs/rubs/gallops. BL radial and DP pulses equal bilaterally.  Resp: Normal respiratory rate and effort. CTAB, no wheezes, rhonchi, crackles.  Abd: Soft, non-tender, non-distended. No rebound tenderness or guarding.  GU: Deferred. MSK: Mild nonpitting symmetric bilateral lower extremity edema. No calf TTP or erythema.  Skin: warm, dry.  Neuro: A&Ox4, CNs II-XII grossly intact. MAEs. Sensation grossly intact.  Psych: Normal mood and affect.   ED Results / Procedures / Treatments   Labs (all labs ordered are listed, but only abnormal results are displayed) Labs Reviewed  BASIC METABOLIC PANEL - Abnormal; Notable for the following components:      Result Value   CO2 19 (*)    Glucose, Bld 248 (*)    Creatinine, Ser 1.10 (*)     All other components within normal limits  CBC - Abnormal; Notable for the following components:   WBC 12.9 (*)    All other components within normal limits  BRAIN NATRIURETIC PEPTIDE - Abnormal; Notable for the following components:   B Natriuretic Peptide 428.0 (*)    All other components within normal limits  MAGNESIUM  D-DIMER, QUANTITATIVE  PREGNANCY, URINE  TSH  T4, FREE  T3, FREE    EKG EKG Interpretation Date/Time:  Friday July 16 2023 10:02:10 EDT Ventricular Rate:  151 PR Interval:  58 QRS Duration:  142 QT Interval:  360 QTC Calculation: 571 R Axis:   194  Text Interpretation: wide complex regular tachyarrhythmia Consider dextrocardia Confirmed by Vivi Barrack [id number]) on 07/16/2023 10:07:12 AM  Radiology DG Chest Portable 1 View  Result Date: 07/16/2023 CLINICAL DATA:  Chest pain.  Shortness of breath. EXAM: PORTABLE CHEST 1 VIEW COMPARISON:  None Available. FINDINGS: Low lung volume. Bilateral lung fields are clear. Bilateral lateral costophrenic angles are clear. Mildly enlarged cardio-mediastinal silhouette, even considering portable AP technique. No acute osseous abnormalities. The soft tissues are within normal limits. IMPRESSION: 1. Mild cardiomegaly. Electronically Signed   By: Jules Schick M.D.   On: 07/16/2023 11:38    Procedures .Critical Care  Performed by: Loetta Rough, MD Authorized by: Loetta Rough, MD   Critical care provider statement:    Critical care time (minutes):  30   Critical care was necessary to treat or prevent imminent or life-threatening deterioration of the following conditions:  Cardiac failure and circulatory failure   Critical care was time spent personally by me on the following activities:  Development of treatment plan with patient or surrogate, discussions with consultants, evaluation of patient's response to treatment, examination of patient, ordering and review of laboratory studies, ordering and review of  radiographic studies, ordering and performing treatments and interventions, pulse oximetry, re-evaluation of patient's condition, review of old charts and obtaining history from patient or surrogate   Care discussed with: admitting provider   .Sedation  Date/Time: 07/16/2023 1:44 PM  Performed by: Loetta Rough, MD Authorized by: Loetta Rough, MD   Consent:    Consent obtained:  Verbal and written   Consent given by:  Patient   Risks discussed:  Allergic reaction, dysrhythmia, vomiting, respiratory compromise necessitating ventilatory assistance and intubation, prolonged hypoxia resulting in organ damage, nausea and inadequate sedation Universal protocol:    Immediately prior to procedure, a time out was called: yes     Patient identity confirmed:  Verbally with patient Indications:    Procedure performed:  Cardioversion   Procedure necessitating sedation performed by:  Physician performing sedation Pre-sedation assessment:    Time since last food or drink:  5 hours   ASA classification: class 2 - patient with mild systemic disease     Mouth opening:  2 finger widths   Thyromental distance:  2  finger widths   Mallampati score:  IV - only hard palate visible   Neck mobility: normal     Pre-sedation assessments completed and reviewed: pre-procedure airway patency not reviewed, pre-procedure cardiovascular function not reviewed, pre-procedure hydration status not reviewed, pre-procedure mental status not reviewed, pre-procedure nausea and vomiting status not reviewed, pre-procedure pain level not reviewed, pre-procedure respiratory function not reviewed and pre-procedure temperature not reviewed   Immediate pre-procedure details:    Reassessment: Patient reassessed immediately prior to procedure     Reviewed: vital signs, relevant labs/tests and NPO status     Verified: bag valve mask available, emergency equipment available, intubation equipment available, IV patency confirmed, oxygen  available and suction available   Procedure details (see MAR for exact dosages):    Preoxygenation:  Nasal cannula   Sedation:  Etomidate   Intended level of sedation: moderate (conscious sedation)   Intra-procedure monitoring:  Blood pressure monitoring, cardiac monitor, continuous pulse oximetry, continuous capnometry, frequent LOC assessments and frequent vital sign checks   Intra-procedure events: none     Intra-procedure management:  Airway repositioning   Total Provider sedation time (minutes):  12 Post-procedure details:    Attendance: Constant attendance by certified staff until patient recovered     Recovery: Patient returned to pre-procedure baseline     Post-sedation assessments completed and reviewed: post-procedure airway patency not reviewed, post-procedure cardiovascular function not reviewed, post-procedure hydration status not reviewed, post-procedure mental status not reviewed, post-procedure nausea and vomiting status not reviewed, pain score not reviewed, post-procedure respiratory function not reviewed and post-procedure temperature not reviewed     Procedure completion:  Tolerated well, no immediate complications .Cardioversion  Date/Time: 07/16/2023 1:45 PM  Performed by: Loetta Rough, MD Authorized by: Loetta Rough, MD   Consent:    Consent obtained:  Verbal and written   Consent given by:  Patient   Risks discussed:  Cutaneous burn, death, induced arrhythmia and pain   Alternatives discussed:  No treatment Pre-procedure details:    Cardioversion basis:  Emergent   Pre-procedure rhythm: wide complex tachyarrhythmia.   Electrode placement:  Anterior-posterior Patient sedated: Yes. Refer to sedation procedure documentation for details of sedation.  Attempt one:    Cardioversion mode:  Synchronous   Waveform:  Monophasic   Shock (Joules):  200   Shock outcome:  Conversion to normal sinus rhythm Post-procedure details:    Patient status:  Awake   Patient  tolerance of procedure:  Tolerated well, no immediate complications     Medications Ordered in ED Medications  ondansetron (ZOFRAN) injection 4 mg (4 mg Intravenous Given 07/16/23 1054)  etomidate (AMIDATE) injection 10 mg (10 mg Intravenous Given 07/16/23 1102)    ED Course/ Medical Decision Making/ A&P                          Medical Decision Making Amount and/or Complexity of Data Reviewed Labs: ordered. Decision-making details documented in ED Course. Radiology: ordered. Decision-making details documented in ED Course.  Risk Prescription drug management. Decision regarding hospitalization.    This patient presents to the ED for concern of dyspnea, CP; this involves an extensive number of treatment options, and is a complaint that carries with it a high risk of complications and morbidity.  I considered the following differential and admission for this acute, potentially life threatening condition.   MDM:    Patient with shortness of breath since yesterday and upper back and chest pain.  EKG on  arrival demonstrates wide-complex tachyarrhythmia with rates at 150.  Appears atrial on EKG.  Differential includes atrial flutter 2-1 with aberrancy, PSVT with aberrancy, accelerated idioventricular rhythm, or of course monomorphic V. Tach. No extreme axis, extreme QRS widening, capture or fusion beats. BP in 190s. Patient appears uncomfortable. Will consent for sedation/sync cardioversion, as onset was yesterday at 3 pm within the last 48 hours.   She is sedated and cardioverted with return to NSR. Post-cardioversion EKGs do not demonstrate any BBB or delta wave. Occasional PVCs/ectopy but stays NSR on telemetry.  As for etiology, patient with history of thyroid disease, consider thyrotoxicosis or thyroid storm as well. She is afebrile but has exophthalmos suggesting symptomatic hyperthyroidism. This would not be traditionally consistent with her elevated TSH recently. She's been off of  synthroid x 4 months, restarted yesterday, possibly contributing to her tachyarrhythmia. She had recent TSH of 113, but did not have T4/T3 levels drawn. Could consider a TSH producing adenoma if T3/T4 levels are high. Could consider side effect from synthroid or too high a dose of synthroid. No pain on TTP of thyroid to suggest de quervain's thyroiditis, could consider subacute painless thyroiditis. Also considered PE but no risk factors for PE and dimer is negative. She does have mild cardiomegaly and elevated BNP to 400s on labs, which could be pre-existing or in response to arrhythmia, could also be cardiomyopathy in s/o thyroid disease, likely will need updated Echo, as last one was in 2021 and showed only trivial MR. No pulm edema or pleural effusion on CXR, no hypoxia/tachypnea/resp distress. She has no h/o cardiac disease, and her electrolytes on labs are also reassuring.    Clinical Course as of 07/16/23 1402  Fri Jul 16, 2023  1115 Cardioverted successfully with etomidate, 200 J [HN]  1115 Basic metabolic panel(!) No electrolyte derangements [HN]  1115 Magnesium: 2.0 wnl [HN]  1141 DG Chest Portable 1 View 1. Mild cardiomegaly. [HN]  1245 D-Dimer, Quant: 0.28 Neg [HN]  1246 B Natriuretic Peptide(!): 428.0 +Mildly elevated [HN]  1257 D/w Dr. Cristal Deer with cardiology who will follow patient along. Consulted to hospitalist for admission. [HN]    Clinical Course User Index [HN] Loetta Rough, MD    Labs: I Ordered, and personally interpreted labs.  The pertinent results include:  those listed above  Imaging Studies ordered: I ordered imaging studies including CXR I independently visualized and interpreted imaging. I agree with the radiologist interpretation  Additional history obtained from chart review.    Cardiac Monitoring: The patient was maintained on a cardiac monitor.  I personally viewed and interpreted the cardiac monitored which showed an underlying rhythm of:  initially wide complex tachyarrhythmia then NSR with intermittent PVCs, no BBB  Reevaluation: After the interventions noted above, I reevaluated the patient and found that they have :improved  Social Determinants of Health: Lives independently  Disposition:  Admit  Co morbidities that complicate the patient evaluation  Past Medical History:  Diagnosis Date   Depression    Diabetes mellitus    Hypothyroid    Leukocytosis 01/25/2020   Obesity    Thrombocytosis 01/25/2020     Medicines Meds ordered this encounter  Medications   ondansetron (ZOFRAN) injection 4 mg   etomidate (AMIDATE) injection 10 mg    I have reviewed the patients home medicines and have made adjustments as needed  Problem List / ED Course: Problem List Items Addressed This Visit       Cardiovascular and Mediastinum   * (Principal)  Tachyarrhythmia - Primary   Other Visit Diagnoses     Elevated TSH                       This note was created using dictation software, which may contain spelling or grammatical errors.    Loetta Rough, MD 07/16/23 [phone]

## 2023-07-16 NOTE — Telephone Encounter (Signed)
Pt is in the ED for SOB FYI

## 2023-07-16 NOTE — Sedation Documentation (Signed)
Patient denies pain.

## 2023-07-16 NOTE — ED Notes (Signed)
Synchronized cardioversion at 200 jules.

## 2023-07-16 NOTE — Plan of Care (Signed)

## 2023-07-16 NOTE — ED Triage Notes (Signed)
Patient here POV from Home.  Endorses worsening SOB since 1500 yesterday. Upper Back and Chest Pain. Present mostly constantly but worse with exertion.   Some Cough. No Fevers.   NAD Noted during Triage. A&Ox4. CGS 15. Ambulatory.

## 2023-07-16 NOTE — Sedation Documentation (Addendum)
No signs of pain. Unable to rate pain

## 2023-07-16 NOTE — ED Notes (Signed)
ED Provider at bedside. 

## 2023-07-16 NOTE — Plan of Care (Signed)
Patient is a 42 year old female with past medical history of hypothyroidism, history of thyroid ablation, uncontrolled diabetes type 2, depression who presented to [facility] with complaint of shortness of breath.  She was also having some upper back and chest pain.  EKG on arrival showed wide-complex tachyarrhythmia with heart rate in the range of 150.  Blood pressure was stable.  Patient was given synchronized cardioversion under sedation after that she converted to normal sinus rhythm.  Patient was recently started on levothyroxine by her PCP because of  high TSH.  Patient is morbidly obese, also noticed to have proptosis.  Suspected thyroid etiology for the underlying cause of arrhythmia. Free T3, T4 sent.  ED physician discussed with cardiology who recommended admission, cardiology have been consulted, case discussed with Dr. Cristal Deer (better to let cardiology know again when patient arrives ). echo done on 04/14/2020 showed moderate LVH, normal left ventricular function, trivial MR.

## 2023-07-17 ENCOUNTER — Observation Stay (HOSPITAL_COMMUNITY): Payer: 59

## 2023-07-17 DIAGNOSIS — I493 Ventricular premature depolarization: Secondary | ICD-10-CM | POA: Diagnosis present

## 2023-07-17 DIAGNOSIS — E039 Hypothyroidism, unspecified: Secondary | ICD-10-CM | POA: Diagnosis not present

## 2023-07-17 DIAGNOSIS — I2510 Atherosclerotic heart disease of native coronary artery without angina pectoris: Secondary | ICD-10-CM | POA: Diagnosis present

## 2023-07-17 DIAGNOSIS — Z79890 Hormone replacement therapy (postmenopausal): Secondary | ICD-10-CM | POA: Diagnosis not present

## 2023-07-17 DIAGNOSIS — I110 Hypertensive heart disease with heart failure: Secondary | ICD-10-CM | POA: Diagnosis present

## 2023-07-17 DIAGNOSIS — Z833 Family history of diabetes mellitus: Secondary | ICD-10-CM | POA: Diagnosis not present

## 2023-07-17 DIAGNOSIS — R0602 Shortness of breath: Secondary | ICD-10-CM

## 2023-07-17 DIAGNOSIS — R0789 Other chest pain: Secondary | ICD-10-CM | POA: Diagnosis present

## 2023-07-17 DIAGNOSIS — Z8249 Family history of ischemic heart disease and other diseases of the circulatory system: Secondary | ICD-10-CM | POA: Diagnosis not present

## 2023-07-17 DIAGNOSIS — R002 Palpitations: Secondary | ICD-10-CM | POA: Diagnosis present

## 2023-07-17 DIAGNOSIS — E890 Postprocedural hypothyroidism: Secondary | ICD-10-CM | POA: Diagnosis present

## 2023-07-17 DIAGNOSIS — Z6841 Body Mass Index (BMI) 40.0 and over, adult: Secondary | ICD-10-CM | POA: Diagnosis not present

## 2023-07-17 DIAGNOSIS — Z79899 Other long term (current) drug therapy: Secondary | ICD-10-CM | POA: Diagnosis not present

## 2023-07-17 DIAGNOSIS — Z7984 Long term (current) use of oral hypoglycemic drugs: Secondary | ICD-10-CM | POA: Diagnosis not present

## 2023-07-17 DIAGNOSIS — Z91199 Patient's noncompliance with other medical treatment and regimen due to unspecified reason: Secondary | ICD-10-CM | POA: Diagnosis not present

## 2023-07-17 DIAGNOSIS — I471 Supraventricular tachycardia, unspecified: Secondary | ICD-10-CM | POA: Diagnosis present

## 2023-07-17 DIAGNOSIS — I3139 Other pericardial effusion (noninflammatory): Secondary | ICD-10-CM | POA: Insufficient documentation

## 2023-07-17 DIAGNOSIS — R059 Cough, unspecified: Secondary | ICD-10-CM | POA: Diagnosis present

## 2023-07-17 DIAGNOSIS — Z5986 Financial insecurity: Secondary | ICD-10-CM | POA: Diagnosis not present

## 2023-07-17 DIAGNOSIS — I32 Pericarditis in diseases classified elsewhere: Secondary | ICD-10-CM | POA: Diagnosis not present

## 2023-07-17 DIAGNOSIS — I10 Essential (primary) hypertension: Secondary | ICD-10-CM | POA: Diagnosis present

## 2023-07-17 DIAGNOSIS — F32A Depression, unspecified: Secondary | ICD-10-CM | POA: Diagnosis present

## 2023-07-17 DIAGNOSIS — I4729 Other ventricular tachycardia: Secondary | ICD-10-CM

## 2023-07-17 DIAGNOSIS — I472 Ventricular tachycardia, unspecified: Secondary | ICD-10-CM | POA: Diagnosis present

## 2023-07-17 DIAGNOSIS — T381X6A Underdosing of thyroid hormones and substitutes, initial encounter: Secondary | ICD-10-CM | POA: Diagnosis present

## 2023-07-17 DIAGNOSIS — R000 Tachycardia, unspecified: Secondary | ICD-10-CM | POA: Diagnosis present

## 2023-07-17 DIAGNOSIS — I428 Other cardiomyopathies: Secondary | ICD-10-CM | POA: Diagnosis present

## 2023-07-17 DIAGNOSIS — Z91138 Patient's unintentional underdosing of medication regimen for other reason: Secondary | ICD-10-CM | POA: Diagnosis not present

## 2023-07-17 DIAGNOSIS — F419 Anxiety disorder, unspecified: Secondary | ICD-10-CM | POA: Diagnosis present

## 2023-07-17 DIAGNOSIS — Z7985 Long-term (current) use of injectable non-insulin antidiabetic drugs: Secondary | ICD-10-CM | POA: Diagnosis not present

## 2023-07-17 DIAGNOSIS — E1165 Type 2 diabetes mellitus with hyperglycemia: Secondary | ICD-10-CM | POA: Diagnosis present

## 2023-07-17 LAB — GLUCOSE, CAPILLARY
Glucose-Capillary: 176 mg/dL — ABNORMAL HIGH (ref 70–99)
Glucose-Capillary: 150 mg/dL — ABNORMAL HIGH (ref 70–99)
Glucose-Capillary: 133 mg/dL — ABNORMAL HIGH (ref 70–99)
Glucose-Capillary: 126 mg/dL — ABNORMAL HIGH (ref 70–99)

## 2023-07-17 LAB — ECHOCARDIOGRAM COMPLETE
Area-P 1/2: 4.41 cm2
Height: 63.5 in
MV M vel: 0.99 m/s
MV Peak grad: 3.9 mmHg
S' Lateral: 2.9 cm
Weight: 5120 oz

## 2023-07-17 LAB — BASIC METABOLIC PANEL
Anion gap: 9 (ref 5–15)
BUN: 13 mg/dL (ref 6–20)
CO2: 20 mmol/L — ABNORMAL LOW (ref 22–32)
Calcium: 8.8 mg/dL — ABNORMAL LOW (ref 8.9–10.3)
Chloride: 105 mmol/L (ref 98–111)
Creatinine, Ser: 0.95 mg/dL (ref 0.44–1.00)
GFR, Estimated: >60 mL/min (ref >60)
Glucose, Bld: 155 mg/dL — ABNORMAL HIGH (ref 70–99)
Potassium: 3.6 mmol/L (ref 3.5–5.1)
Sodium: 134 mmol/L — ABNORMAL LOW (ref 135–145)

## 2023-07-17 LAB — CBC
HCT: 31.3 % — ABNORMAL LOW (ref 36.0–46.0)
Hemoglobin: 10.4 g/dL — ABNORMAL LOW (ref 12.0–15.0)
MCH: 32.7 pg (ref 26.0–34.0)
MCHC: 33.2 g/dL (ref 30.0–36.0)
MCV: 98.4 fL (ref 80.0–100.0)
Platelets: 202 10*3/uL (ref 150–400)
RBC: 3.18 MIL/uL — ABNORMAL LOW (ref 3.87–5.11)
RDW: 13.3 % (ref 11.5–15.5)
WBC: 9.5 10*3/uL (ref 4.0–10.5)
nRBC: 0 % (ref 0.0–0.2)

## 2023-07-17 LAB — T3, FREE: T3, Free: <0.3 pg/mL — ABNORMAL LOW (ref 2.0–4.4)

## 2023-07-17 MED ORDER — LOPERAMIDE HCL 2 MG PO CAPS
2.0000 mg | ORAL_CAPSULE | ORAL | Status: DC | PRN
  Administered 2023-07-17 – 2023-07-20 (×3): 2 mg via ORAL
  Filled 2023-07-17 (×3): qty 1

## 2023-07-17 NOTE — Progress Notes (Signed)
  Echocardiogram 2D Echocardiogram has been performed.  Joanne Harris 07/17/2023, 9:54 AM

## 2023-07-17 NOTE — Progress Notes (Signed)
DAILY PROGRESS NOTE   Patient Name: Joanne Harris Date of Encounter: 07/17/2023 Cardiologist: None  Chief Complaint   Feels fine  Patient Profile   42 yo female with DM2, hypothyroidism status post thyroid ablation, obesity, presented to [facility] with shortness of breath and back pain, non-complaint with Synthroid, presented with WCT and underwent cardioversion - found to have TSH of 121  Subjective   No complaints overnight.  It does not appear she has had any further ventricular tachycardia.  I reviewed her EKG with Dr. Nelly Laurence and he feels that is monomorphic VT.  The etiology of this is not clear.  An echocardiogram was performed and I interpreted that today.  This shows normal LVEF however she has a large pericardial effusion.  There is some RA and RV collapse with probable moderate RV systolic dysfunction.  The IVC is not dilated but poorly visualized and I cannot exclude tamponade physiology although she has no evidence of hypotension or other symptoms at this point making clinical tamponade unlikely.  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  There are few case reports of ventricular tachycardia in the setting of profound hypothyroidism.  This may have to do with impairment of intracardiac conduction.  Objective   Vitals:   07/17/23 0559 07/17/23 0809 07/17/23 0948 07/17/23 1124  BP:  (!) 125/94 134/82 (!) 132/91  Pulse: 83 86  88  Resp: 18 18  18   Temp: 98.2 F (36.8 C) 98 F (36.7 C) 99 F (37.2 C) 98.3 F (36.8 C)  TempSrc: Oral Oral Oral Oral  SpO2: 97%   96%  Weight:      Height:       No intake or output data in the 24 hours ending 07/17/23 1212 Filed Weights   07/16/23 0955 07/16/23 1623  Weight: (!) 145.2 kg (!) 145.2 kg    Physical Exam   General appearance: alert, no distress, and morbidly obese Neck: no carotid bruit, no JVD, and thyroid not enlarged, symmetric, no tenderness/mass/nodules Lungs: diminished breath  sounds bilaterally Heart: regular rate and rhythm and distant heart sounds Abdomen: Morbidly obese, soft Extremities: extremities normal, atraumatic, no cyanosis or edema Pulses: 2+ and symmetric Skin: Skin color, texture, turgor normal. No rashes or lesions Neurologic: Grossly normal Psych: Pleasant  Inpatient Medications    Scheduled Meds:  enoxaparin (LOVENOX) injection  70 mg Subcutaneous Q24H   insulin aspart  0-9 Units Subcutaneous TID WC   levothyroxine  50 mcg Oral Q0600   sodium chloride flush  3 mL Intravenous Q12H    Continuous Infusions:  sodium chloride      PRN Meds: sodium chloride, acetaminophen **OR** acetaminophen, hydrALAZINE, levalbuterol, melatonin, polyethylene glycol, sodium chloride flush   Labs   Results for orders placed or performed during the hospital encounter of 07/16/23 (from the past 48 hour(s))  Basic metabolic panel     Status: Abnormal   Collection Time: 07/16/23 10:12 AM  Result Value Ref Range   Sodium 135 135 - 145 mmol/L   Potassium 3.5 3.5 - 5.1 mmol/L   Chloride 101 98 - 111 mmol/L   CO2 19 (L) 22 - 32 mmol/L   Glucose, Bld 248 (H) 70 - 99 mg/dL    Comment: Glucose reference range applies only to samples taken after fasting for at least 8 hours.   BUN 15 6 - 20 mg/dL   Creatinine, Ser 4.09 (H) 0.44 - 1.00 mg/dL   Calcium 9.0 8.9 - 81.1 mg/dL  GFR, Estimated >60 >60 mL/min    Comment: (NOTE) Calculated using the CKD-EPI Creatinine Equation (2021)    Anion gap 15 5 - 15    Comment: Performed at [facility], [address] [place], [place] [address]  CBC     Status: Abnormal   Collection Time: 07/16/23 10:12 AM  Result Value Ref Range   WBC 12.9 (H) 4.0 - 10.5 K/uL   RBC 3.92 3.87 - 5.11 MIL/uL   Hemoglobin 12.9 12.0 - 15.0 g/dL   HCT 84.1 66.0 - 63.0 %   MCV 96.9 80.0 - 100.0 fL   MCH 32.9 26.0 - 34.0 pg   MCHC 33.9 30.0 - 36.0 g/dL   RDW 16.0 10.9 - 32.3 %   Platelets 395 150 - 400 K/uL   nRBC 0.0 0.0 -  0.2 %    Comment: Performed at [facility], [address] [place], [place] [address]  Magnesium     Status: None   Collection Time: 07/16/23 10:12 AM  Result Value Ref Range   Magnesium 2.0 1.7 - 2.4 mg/dL    Comment: Performed at [facility], [address] [place], [place] [address]  TSH     Status: Abnormal   Collection Time: 07/16/23 10:12 AM  Result Value Ref Range   TSH 121.225 (H) 0.350 - 4.500 uIU/mL    Comment: Performed by a 3rd Generation assay with a functional sensitivity of <=0.01 uIU/mL. Performed at [facility], [address], [place], [place] [address]   T4, free     Status: Abnormal   Collection Time: 07/16/23 10:12 AM  Result Value Ref Range   Free T4 <0.25 (L) 0.61 - 1.12 ng/dL    Comment: (NOTE) Biotin ingestion may interfere with free T4 tests. If the results are inconsistent with the TSH level, previous test results, or the clinical presentation, then consider biotin interference. If needed, order repeat testing after stopping biotin. Performed at [facility], [address] [place], [place] [address]   Brain natriuretic peptide     Status: Abnormal   Collection Time: 07/16/23 10:12 AM  Result Value Ref Range   B Natriuretic Peptide 428.0 (H) 0.0 - 100.0 pg/mL    Comment: Performed at [facility], [address] [place], [place] [address]  D-dimer, quantitative     Status: None   Collection Time: 07/16/23 10:12 AM  Result Value Ref Range   D-Dimer, Quant 0.28 0.00 - 0.50 ug/mL-FEU    Comment: (NOTE) At the manufacturer cut-off value of 0.5 g/mL FEU, this assay has a negative predictive value of 95-100%.This assay is intended for use in conjunction with a clinical pretest probability (PTP) assessment model to exclude pulmonary embolism (PE) and deep venous thrombosis (DVT) in outpatients suspected of PE or DVT. Results should be correlated with clinical presentation. Performed at [facility], [address] [place], [place] [address]   HIV Antibody (routine testing w rflx)     Status: None   Collection Time: 07/16/23  6:02 PM  Result Value Ref Range   HIV Screen 4th Generation wRfx Non Reactive Non Reactive    Comment: Performed at [facility], [address] [place], [place] [address]  CBC     Status: Abnormal   Collection Time: 07/16/23  6:02 PM  Result Value Ref Range   WBC 10.4 4.0 - 10.5 K/uL  RBC 3.72 (L) 3.87 - 5.11 MIL/uL   Hemoglobin 12.1 12.0 - 15.0 g/dL   HCT 16.1 09.6 - 04.5 %   MCV 97.8 80.0 - 100.0 fL   MCH 32.5 26.0 - 34.0 pg   MCHC 33.2 30.0 - 36.0 g/dL   RDW 40.9 81.1 - 91.4 %   Platelets 373 150 - 400 K/uL   nRBC 0.0 0.0 - 0.2 %    Comment: Performed at [facility], [address], [place], [place] [address]  Creatinine, serum     Status: None   Collection Time: 07/16/23  6:02 PM  Result Value Ref Range   Creatinine, Ser 0.96 0.44 - 1.00 mg/dL   GFR, Estimated >62 >13 mL/min    Comment: (NOTE) Calculated using the CKD-EPI Creatinine Equation (2021) Performed at [facility], [address] [place], [place] [address]   Troponin I (High Sensitivity)     Status: Abnormal   Collection Time: 07/16/23  6:02 PM  Result Value Ref Range   Troponin I (High Sensitivity) 61 (H) <18 ng/L    Comment: (NOTE) Elevated high sensitivity troponin I (hsTnI) values and significant  changes across serial measurements may suggest ACS but many other  chronic and acute conditions are known to elevate hsTnI results.  Refer to the "Links" section for chest pain algorithms and additional  guidance. Performed at [facility], [address] [place], [place] [address]   Troponin I (High Sensitivity)     Status: Abnormal   Collection Time: 07/16/23  8:26 PM  Result Value Ref Range   Troponin I (High Sensitivity) 60 (H) <18 ng/L    Comment: (NOTE) Elevated high sensitivity troponin I (hsTnI) values and significant  changes  across serial measurements may suggest ACS but many other  chronic and acute conditions are known to elevate hsTnI results.  Refer to the "Links" section for chest pain algorithms and additional  guidance. Performed at [facility], [address] [place], [place] [address]   Basic metabolic panel     Status: Abnormal   Collection Time: 07/17/23  4:31 AM  Result Value Ref Range   Sodium 134 (L) 135 - 145 mmol/L   Potassium 3.6 3.5 - 5.1 mmol/L   Chloride 105 98 - 111 mmol/L   CO2 20 (L) 22 - 32 mmol/L   Glucose, Bld 155 (H) 70 - 99 mg/dL    Comment: Glucose reference range applies only to samples taken after fasting for at least 8 hours.   BUN 13 6 - 20 mg/dL   Creatinine, Ser 4.13 0.44 - 1.00 mg/dL   Calcium 8.8 (L) 8.9 - 10.3 mg/dL   GFR, Estimated >24 >40 mL/min    Comment: (NOTE) Calculated using the CKD-EPI Creatinine Equation (2021)    Anion gap 9 5 - 15    Comment: Performed at [facility], [address] [place], [place] [address]  CBC     Status: Abnormal   Collection Time: 07/17/23  4:31 AM  Result Value Ref Range   WBC 9.5 4.0 - 10.5 K/uL   RBC 3.18 (L) 3.87 - 5.11 MIL/uL   Hemoglobin 10.4 (L) 12.0 - 15.0 g/dL   HCT 53.6 (L) 64.4 - 03.4 %   MCV 98.4 80.0 - 100.0 fL   MCH 32.7 26.0 - 34.0 pg   MCHC 33.2 30.0 - 36.0 g/dL   RDW 74.2 59.5 - 63.8 %   Platelets 202 150 -  400 K/uL   nRBC 0.0 0.0 - 0.2 %    Comment: Performed at [facility], [address], [place], [place] [address]  Glucose, capillary     Status: Abnormal   Collection Time: 07/17/23  7:28 AM  Result Value Ref Range   Glucose-Capillary 176 (H) 70 - 99 mg/dL    Comment: Glucose reference range applies only to samples taken after fasting for at least 8 hours.  Glucose, capillary     Status: Abnormal   Collection Time: 07/17/23 11:21 AM  Result Value Ref Range   Glucose-Capillary 150 (H) 70 - 99 mg/dL    Comment: Glucose reference range applies only to samples taken after fasting  for at least 8 hours.    ECG   Normal sinus rhythm with low voltage- Personally Reviewed  Telemetry   Normal sinus rhythm- Personally Reviewed  Radiology    ECHOCARDIOGRAM COMPLETE  Result Date: 07/17/2023    ECHOCARDIOGRAM REPORT   Patient Name:   Joanne Harris Date of Exam: 07/17/2023 Medical Rec #:  [id number]           Height:       63.5 in Accession #:    [id number]          Weight:       320.0 lb Date of Birth:  1981-10-22          BSA:          2.375 m Patient Age:    41 years            BP:           125/94 mmHg Patient Gender: F                   HR:           84 bpm. Exam Location:  Inpatient Procedure: 2D Echo, Cardiac Doppler and Color Doppler Indications:    Ventricular Tachycardia I47.2  History:        Patient has prior history of Echocardiogram examinations, most                 recent 04/16/2020. Arrythmias:Tachycardia; Risk Factors:Diabetes                 and Non-Smoker.  Sonographer:    Aron Baba Referring Phys: Sharla Kidney [facility]  Sonographer Comments: Patient is obese and Technically difficult study due to poor echo windows. IMPRESSIONS  1. Limited echo windows- Definity contrast was not administered. Left ventricular ejection fraction, by estimation, is 60 to 65%. The left ventricle has normal function. The left ventricle has no regional wall motion abnormalities. There is moderate left ventricular hypertrophy. Left ventricular diastolic parameters are consistent with Grade I diastolic dysfunction (impaired relaxation).  2. Poorly visualized, appears underfilled. RA and RV collapse noted. RVEF is reduced. Right ventricular systolic function is moderately reduced. The right ventricular size is small. There is normal pulmonary artery systolic pressure. The estimated right  ventricular systolic pressure is 11.7 mmHg.  3. Cannot rule-out tamponade physiology. Large pericardial effusion. The pericardial effusion is circumferential.  4. The mitral valve is grossly normal.  Trivial mitral valve regurgitation.  5. The aortic valve was not well visualized. Aortic valve regurgitation is not visualized. No aortic stenosis is present.  6. IVC is poorly visualized, but does not appear dilated. Comparison(s): Changes from prior study are noted. 04/16/2020: LVEF 65-70%, moderate LVH, normal RV function, trivial pericardial effusion. Conclusion(s)/Recommendation(s): Large pericardial effusion -  cannot exclude tamponade physiology. See clinical evalutions and recommendations in my progress note. FINDINGS  Left Ventricle: Limited echo windows- Definity contrast was not administered. Left ventricular ejection fraction, by estimation, is 60 to 65%. The left ventricle has normal function. The left ventricle has no regional wall motion abnormalities. The left  ventricular internal cavity size was normal in size. There is moderate left ventricular hypertrophy. Left ventricular diastolic parameters are consistent with Grade I diastolic dysfunction (impaired relaxation). Indeterminate filling pressures. Right Ventricle: Poorly visualized, appears underfilled. RA and RV collapse noted. RVEF is reduced. The right ventricular size is small. Right vetricular wall thickness was not well visualized. Right ventricular systolic function is moderately reduced. There is normal pulmonary artery systolic pressure. The tricuspid regurgitant velocity is 0.96 m/s, and with an assumed right atrial pressure of 8 mmHg, the estimated right ventricular systolic pressure is 11.7 mmHg. Left Atrium: Left atrial size was normal in size. Right Atrium: Right atrial size was normal in size. Pericardium: Cannot rule-out tamponade physiology. A large pericardial effusion is present. The pericardial effusion is circumferential. There is diastolic collapse of the right ventricular free wall and diastolic collapse of the right atrial wall. Mitral Valve: The mitral valve is grossly normal. Trivial mitral valve regurgitation. Tricuspid  Valve: The tricuspid valve is not well visualized. Tricuspid valve regurgitation is trivial. Aortic Valve: The aortic valve was not well visualized. Aortic valve regurgitation is not visualized. No aortic stenosis is present. Pulmonic Valve: The pulmonic valve was not well visualized. Pulmonic valve regurgitation is not visualized. Aorta: The aortic root and ascending aorta are structurally normal, with no evidence of dilitation. Venous: IVC is poorly visualized, but does not appear dilated. The inferior vena cava was not well visualized. IAS/Shunts: The interatrial septum was not well visualized.  LEFT VENTRICLE PLAX 2D LVIDd:         3.90 cm   Diastology LVIDs:         2.90 cm   LV e' medial:    5.98 cm/s LV PW:         1.10 cm   LV E/e' medial:  15.1 LV IVS:        0.80 cm   LV e' lateral:   7.62 cm/s LVOT diam:     1.80 cm   LV E/e' lateral: 11.9 LV SV:         54 LV SV Index:   23 LVOT Area:     2.54 cm  RIGHT VENTRICLE RV S prime:     4.57 cm/s TAPSE (M-mode): 1.3 cm LEFT ATRIUM             Index        RIGHT ATRIUM           Index LA diam:        3.20 cm 1.35 cm/m   RA Area:     24.10 cm LA Vol (A2C):   30.8 ml 12.97 ml/m  RA Volume:   77.50 ml  32.63 ml/m LA Vol (A4C):   74.9 ml 31.54 ml/m LA Biplane Vol: 51.8 ml 21.81 ml/m  AORTIC VALVE LVOT Vmax:   121.00 cm/s LVOT Vmean:  79.300 cm/s LVOT VTI:    0.213 m  AORTA Ao Root diam: 3.20 cm Ao Asc diam:  3.20 cm MITRAL VALVE               TRICUSPID VALVE MV Area (PHT): 4.41 cm    TR Peak grad:   3.7 mmHg  MV Decel Time: 172 msec    TR Vmax:        95.60 cm/s MR Peak grad: 3.9 mmHg MR Vmax:      99.10 cm/s   SHUNTS MV E velocity: 90.40 cm/s  Systemic VTI:  0.21 m MV A velocity: 85.30 cm/s  Systemic Diam: 1.80 cm MV E/A ratio:  1.06 Zoila Shutter MD Electronically signed by Zoila Shutter MD Signature Date/Time: 07/17/2023/11:05:48 AM    Final    DG Chest Portable 1 View  Result Date: 07/16/2023 CLINICAL DATA:  Chest pain.  Shortness of breath. EXAM:  PORTABLE CHEST 1 VIEW COMPARISON:  None Available. FINDINGS: Low lung volume. Bilateral lung fields are clear. Bilateral lateral costophrenic angles are clear. Mildly enlarged cardio-mediastinal silhouette, even considering portable AP technique. No acute osseous abnormalities. The soft tissues are within normal limits. IMPRESSION: 1. Mild cardiomegaly. Electronically Signed   By: Jules Schick M.D.   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 Wible was noted to have a presentation of chest pain and shortness of breath and found to be in stable ventricular tachycardia.  She was appropriately cardioverted and has not had recurrence.  She was subsequently found to have severe hypothyroidism due to noncompliance with her medication.  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  Today on echo she has been found to have a large pericardial effusion with some RA and RV collapse but no clear mitral tricuspid inflow variation and the IVC is not dilated but was not clearly visualized.  Clinically she does not have tamponade features at this time but will require pericardiocentesis.  She should also have a coronary evaluation although it seems that this would be less likely of a cause of her VT, especially monomorphic VT.  Will monitor closely with a plan to undergo pericardiocentesis on Monday.  Will defer to medicine service regarding reestablishment of her thyroid medications.  Time Spent Directly with Patient:  I have spent a total of 35 minutes with the patient reviewing hospital notes, telemetry, EKGs, labs and examining the patient as well as establishing an assessment and plan that was discussed personally with the  patient.  > 50% of time was spent in direct patient care.  Length of Stay:  LOS: 0 days   Chrystie Nose, MD, [facility], FACP  [place]  [facility]  Medical Director of the [facility] Lipid [facility]  Cardiovascular Risk Reduction Clinic Diplomate of the American Board of Clinical Lipidology Attending Cardiologist  Direct Dial: [phone]  Fax: [phone]  Website:  [email or web address]  Lisette Abu Betsie Peckman 07/17/2023, 12:12 PM

## 2023-07-17 NOTE — Progress Notes (Signed)
PROGRESS NOTE    Joanne Harris  ZOX:[id number] DOB: [birth year] DOA: 07/16/2023 PCP: [name], MD   Brief Narrative:  Joanne Harris is a 42 y.o. female with medical history significant of DM-2, post ablative hypothyroidism, anxiety-who started having palpitations prior to hospitalization, patient admits profound noncompliance with home thyroid supplementation, TSH is markedly elevated subsequently noted to have palpitations.  Outside facility noted wide-complex tachycardia with worsening shortness of breath and questionable chest pain undergoing synchronized cardioversion currently under sinus rhythm admitted to [facility] with cardiology consult.   Assessment & Plan:   Principal Problem:   Tachyarrhythmia Active Problems:   DEPRESSION/ANXIETY   Type 2 diabetes mellitus with hyperglycemia (HCC)   Postablative hypothyroidism  Acute symptomatic hypothyroidism secondary to noncompliance, POA -Patient admits to noncompliance at least 3 to 4 months, TSH per chart review remains markedly elevated over the last year -Tachydysrhythmia resolved with cardioversion -Continue thyroid supplementation  Wide-complex tachycardia Likely atrial flutter with aberrancy vs VT per cardiology Synchronized cardioversion at outside facility, remains in sinus rhythm Follow electrolytes  Large pericardial effusion -Known to be associated with longstanding hypothyroidism, cardiology recommending pericardiocentesis -this can be done nonurgently given no signs or symptoms of tamponade at this time  Non-insulin-dependent DM-2 -uncontrolled with hyperglycemia -Again secondary to noncompliance with medication and lifestyle -A1c 10.3 confirming profoundly uncontrolled hyperglycemia -Recently restarted metformin -Will likely transition to long-acting insulin based on sliding scale needs given her profound hyperglycemia - patient continues to refuse sliding scale insulin  Morbid  Obesity  Body mass index is 55.8 kg/m.   DVT prophylaxis: Lovenox Code Status:   Code Status: Full Code Family Communication: None present  Status is: Inpatient  Dispo: The patient is from: Home              Anticipated d/c is to: Home              Anticipated d/c date is: 48 to 72 hours pending clinical course and cardiology procedure              Patient currently not medically stable for discharge  Consultants:  Cardiology  Procedures:  Planned pericardiocentesis 07/19/2023  Antimicrobials:  None indicated  Subjective: No acute issues or events overnight patient denies nausea vomiting diarrhea constipation headache fevers chills chest pain shortness of breath.  No further episodes of palpitations or chest discomfort.  Objective: Vitals:   07/17/23 0000 07/17/23 0400 07/17/23 0559 07/17/23 0809  BP:    (!) 125/94  Pulse:   83 86  Resp:   18 18  Temp:   98.2 F (36.8 C) 98 F (36.7 C)  TempSrc:   Oral Oral  SpO2: 98% 99% 97%   Weight:      Height:       No intake or output data in the 24 hours ending 07/17/23 0825 Filed Weights   07/16/23 0955 07/16/23 1623  Weight: (!) 145.2 kg (!) 145.2 kg    Examination:  General:  Pleasantly resting in bed, No acute distress. HEENT:  Normocephalic atraumatic.  Sclerae nonicteric, noninjected.  Extraocular movements intact bilaterally. Neck:  Without mass or deformity.  Trachea is midline. Lungs:  Clear to auscultate bilaterally without rhonchi, wheeze, or rales. Heart:  Regular rate and rhythm.  Without murmurs, rubs, or gallops. Abdomen:  Soft, obese, nontender, nondistended.  Without guarding or rebound. Extremities: Without cyanosis, clubbing, edema, or obvious deformity. Skin:  Warm and dry, no erythema.  Data Reviewed: I have personally reviewed  following labs and imaging studies  CBC: Recent Labs  Lab 07/16/23 1012 07/16/23 1802 07/17/23 0431  WBC 12.9* 10.4 9.5  HGB 12.9 12.1 10.4*  HCT 38.0 36.4 31.3*   MCV 96.9 97.8 98.4  PLT 395 373 202   Basic Metabolic Panel: Recent Labs  Lab 07/16/23 1012 07/16/23 1802 07/17/23 0431  NA 135  --  134*  K 3.5  --  3.6  CL 101  --  105  CO2 19*  --  20*  GLUCOSE 248*  --  155*  BUN 15  --  13  CREATININE 1.10* 0.96 0.95  CALCIUM 9.0  --  8.8*  MG 2.0  --   --    GFR: Estimated Creatinine Clearance: 112.1 mL/min (by C-G formula based on SCr of 0.95 mg/dL).  CBG: Recent Labs  Lab 07/17/23 0728  GLUCAP 176*   Thyroid Function Tests: Recent Labs    07/16/23 1012  TSH 121.225*  FREET4 <0.25*    No results found for this or any previous visit (from the past 240 hour(s)).   Radiology Studies: DG Chest Portable 1 View  Result Date: 07/16/2023 CLINICAL DATA:  Chest pain.  Shortness of breath. EXAM: PORTABLE CHEST 1 VIEW COMPARISON:  None Available. FINDINGS: Low lung volume. Bilateral lung fields are clear. Bilateral lateral costophrenic angles are clear. Mildly enlarged cardio-mediastinal silhouette, even considering portable AP technique. No acute osseous abnormalities. The soft tissues are within normal limits. IMPRESSION: 1. Mild cardiomegaly. Electronically Signed   By: Jules Schick M.D.   On: 07/16/2023 11:38        Scheduled Meds:  enoxaparin (LOVENOX) injection  70 mg Subcutaneous Q24H   insulin aspart  0-9 Units Subcutaneous TID WC   levothyroxine  50 mcg Oral Q0600   sodium chloride flush  3 mL Intravenous Q12H   Continuous Infusions:  sodium chloride       LOS: 0 days   Time spent:  Azucena Fallen, DO [facility]  If 7PM-7AM, please contact night-coverage [email or web address]  07/17/2023, 8:25 AM

## 2023-07-17 NOTE — Plan of Care (Signed)

## 2023-07-18 DIAGNOSIS — I472 Ventricular tachycardia, unspecified: Secondary | ICD-10-CM

## 2023-07-18 DIAGNOSIS — I32 Pericarditis in diseases classified elsewhere: Secondary | ICD-10-CM | POA: Diagnosis not present

## 2023-07-18 DIAGNOSIS — I3139 Other pericardial effusion (noninflammatory): Secondary | ICD-10-CM | POA: Diagnosis not present

## 2023-07-18 DIAGNOSIS — E890 Postprocedural hypothyroidism: Secondary | ICD-10-CM

## 2023-07-18 DIAGNOSIS — E039 Hypothyroidism, unspecified: Secondary | ICD-10-CM | POA: Diagnosis not present

## 2023-07-18 LAB — GLUCOSE, CAPILLARY
Glucose-Capillary: 156 mg/dL — ABNORMAL HIGH (ref 70–99)
Glucose-Capillary: 132 mg/dL — ABNORMAL HIGH (ref 70–99)
Glucose-Capillary: 135 mg/dL — ABNORMAL HIGH (ref 70–99)
Glucose-Capillary: 133 mg/dL — ABNORMAL HIGH (ref 70–99)

## 2023-07-18 MED ORDER — SODIUM CHLORIDE 0.9 % WEIGHT BASED INFUSION: 3.0000 mL/kg/h | INTRAVENOUS | Status: DC

## 2023-07-18 MED ORDER — CHLORHEXIDINE GLUCONATE 4 % EX SOLN
60.0000 mL | Freq: Once | CUTANEOUS | Status: AC
  Administered 2023-07-18: 4 via TOPICAL

## 2023-07-18 MED ORDER — SODIUM CHLORIDE 0.9 % WEIGHT BASED INFUSION: 1.0000 mL/kg/h | INTRAVENOUS | Status: DC

## 2023-07-18 MED ORDER — ASPIRIN 81 MG PO CHEW
81.0000 mg | CHEWABLE_TABLET | ORAL | Status: AC
  Administered 2023-07-19: 81 mg via ORAL
  Filled 2023-07-18: qty 1

## 2023-07-18 MED ORDER — CHLORHEXIDINE GLUCONATE 4 % EX SOLN
60.0000 mL | Freq: Once | CUTANEOUS | Status: DC
  Filled 2023-07-18: qty 60

## 2023-07-18 NOTE — Plan of Care (Signed)
Problem: Education: Goal: Knowledge of General Education information will improve Description: Including pain rating scale, medication(s)/side effects and non-pharmacologic comfort measures Outcome: Progressing   Problem: Clinical Measurements: Goal: Ability to maintain clinical measurements within normal limits will improve Outcome: Progressing Goal: Will remain free from infection Outcome: Progressing Goal: Cardiovascular complication will be avoided Outcome: Progressing   Problem: Activity: Goal: Risk for activity intolerance will decrease Outcome: Progressing   Problem: Nutrition: Goal: Adequate nutrition will be maintained Outcome: Progressing   Problem: Coping: Goal: Level of anxiety will decrease Outcome: Progressing   Problem: Pain Managment: Goal: General experience of comfort will improve Outcome: Progressing   Problem: Safety: Goal: Ability to remain free from injury will improve Outcome: Progressing   Problem: Skin Integrity: Goal: Risk for impaired skin integrity will decrease Outcome: Progressing

## 2023-07-18 NOTE — Progress Notes (Signed)
DAILY PROGRESS NOTE   Patient Name: Joanne Harris Date of Encounter: 07/18/2023 Cardiologist: None  Chief Complaint   No complaints  Patient Profile   42 yo female with DM2, hypothyroidism status post thyroid ablation, obesity, presented to [facility] with shortness of breath and back pain, non-complaint with Synthroid, presented with WCT and underwent cardioversion - found to have TSH of 121  Subjective   No issues overnight- discussed pericardiocentesis for diagnostic and therapeutic benefit - suspect this is related to profound hypothyroidism.  Objective   Vitals:   07/17/23 2136 07/18/23 0000 07/18/23 0400 07/18/23 0415  BP: (!) 147/89   126/83  Pulse: 90   85  Resp:    18  Temp:    98.5 F (36.9 C)  TempSrc:    Oral  SpO2:  98% 98% 94%  Weight:      Height:       No intake or output data in the 24 hours ending 07/18/23 1010 Filed Weights   07/16/23 0955 07/16/23 1623  Weight: (!) 145.2 kg (!) 145.2 kg    Physical Exam   General appearance: alert, no distress, and morbidly obese Neck: no carotid bruit, no JVD, and thyroid not enlarged, symmetric, no tenderness/mass/nodules Lungs: diminished breath sounds bilaterally Heart: regular rate and rhythm and distant heart sounds Abdomen: Morbidly obese, soft Extremities: extremities normal, atraumatic, no cyanosis or edema Pulses: 2+ and symmetric Skin: Skin color, texture, turgor normal. No rashes or lesions Neurologic: Grossly normal Psych: Pleasant  Inpatient Medications    Scheduled Meds:  enoxaparin (LOVENOX) injection  70 mg Subcutaneous Q24H   insulin aspart  0-9 Units Subcutaneous TID WC   levothyroxine  50 mcg Oral Q0600   sodium chloride flush  3 mL Intravenous Q12H    Continuous Infusions:  sodium chloride      PRN Meds: sodium chloride, acetaminophen **OR** acetaminophen, hydrALAZINE, levalbuterol, loperamide, melatonin, polyethylene glycol, sodium chloride flush   Labs   Results for  orders placed or performed during the hospital encounter of 07/16/23 (from the past 48 hour(s))  Basic metabolic panel     Status: Abnormal   Collection Time: 07/16/23 10:12 AM  Result Value Ref Range   Sodium 135 135 - 145 mmol/L   Potassium 3.5 3.5 - 5.1 mmol/L   Chloride 101 98 - 111 mmol/L   CO2 19 (L) 22 - 32 mmol/L   Glucose, Bld 248 (H) 70 - 99 mg/dL    Comment: Glucose reference range applies only to samples taken after fasting for at least 8 hours.   BUN 15 6 - 20 mg/dL   Creatinine, Ser 6.64 (H) 0.44 - 1.00 mg/dL   Calcium 9.0 8.9 - 40.3 mg/dL   GFR, Estimated >47 >42 mL/min    Comment: (NOTE) Calculated using the CKD-EPI Creatinine Equation (2021)    Anion gap 15 5 - 15    Comment: Performed at [facility], [address] [place], [place] [address]  CBC     Status: Abnormal   Collection Time: 07/16/23 10:12 AM  Result Value Ref Range   WBC 12.9 (H) 4.0 - 10.5 K/uL   RBC 3.92 3.87 - 5.11 MIL/uL   Hemoglobin 12.9 12.0 - 15.0 g/dL   HCT 87.5 64.3 - 32.9 %   MCV 96.9 80.0 - 100.0 fL   MCH 32.9 26.0 - 34.0 pg   MCHC 33.9 30.0 - 36.0 g/dL   RDW 51.8 84.1 - 66.0 %   Platelets  395 150 - 400 K/uL   nRBC 0.0 0.0 - 0.2 %    Comment: Performed at [facility], [address] [place], [place] [address]  Magnesium     Status: None   Collection Time: 07/16/23 10:12 AM  Result Value Ref Range   Magnesium 2.0 1.7 - 2.4 mg/dL    Comment: Performed at [facility], [address] [place], [place] [address]  TSH     Status: Abnormal   Collection Time: 07/16/23 10:12 AM  Result Value Ref Range   TSH 121.225 (H) 0.350 - 4.500 uIU/mL    Comment: Performed by a 3rd Generation assay with a functional sensitivity of <=0.01 uIU/mL. Performed at [facility], [address], [place], [place] [address]   T4, free     Status: Abnormal   Collection Time: 07/16/23 10:12 AM  Result Value Ref Range   Free T4 <0.25 (L) 0.61 - 1.12 ng/dL    Comment:  (NOTE) Biotin ingestion may interfere with free T4 tests. If the results are inconsistent with the TSH level, previous test results, or the clinical presentation, then consider biotin interference. If needed, order repeat testing after stopping biotin. Performed at [facility], [address] [place], [place] [address]   Brain natriuretic peptide     Status: Abnormal   Collection Time: 07/16/23 10:12 AM  Result Value Ref Range   B Natriuretic Peptide 428.0 (H) 0.0 - 100.0 pg/mL    Comment: Performed at [facility], [address] [place], [place] [address]  D-dimer, quantitative     Status: None   Collection Time: 07/16/23 10:12 AM  Result Value Ref Range   D-Dimer, Quant 0.28 0.00 - 0.50 ug/mL-FEU    Comment: (NOTE) At the manufacturer cut-off value of 0.5 g/mL FEU, this assay has a negative predictive value of 95-100%.This assay is intended for use in conjunction with a clinical pretest probability (PTP) assessment model to exclude pulmonary embolism (PE) and deep venous thrombosis (DVT) in outpatients suspected of PE or DVT. Results should be correlated with clinical presentation. Performed at [facility], [address] [place], [place] [address]   T3, free     Status: Abnormal   Collection Time: 07/16/23  1:20 PM  Result Value Ref Range   T3, Free <0.3 (L) 2.0 - 4.4 pg/mL    Comment: (NOTE) Performed At: [facility] [address] [place], [place] [id number] Jolene Schimke MD [id number]   HIV Antibody (routine testing w rflx)     Status: None   Collection Time: 07/16/23  6:02 PM  Result Value Ref Range   HIV Screen 4th Generation wRfx Non Reactive Non Reactive    Comment: Performed at [facility], [address] [place], [place] [address]  CBC     Status: Abnormal   Collection Time: 07/16/23  6:02 PM  Result Value Ref Range   WBC 10.4 4.0 - 10.5 K/uL   RBC 3.72 (L) 3.87 - 5.11 MIL/uL   Hemoglobin 12.1 12.0 -  15.0 g/dL   HCT 66.4 40.3 - 47.4 %   MCV 97.8 80.0 - 100.0 fL   MCH 32.5 26.0 - 34.0 pg   MCHC 33.2 30.0 - 36.0 g/dL   RDW 25.9 56.3 - 87.5 %   Platelets 373 150 - 400 K/uL   nRBC 0.0 0.0 - 0.2 %    Comment: Performed at [facility], [address] [place], [place] [address]  Creatinine, serum     Status: None   Collection Time: 07/16/23  6:02 PM  Result Value Ref Range   Creatinine, Ser 0.96 0.44 - 1.00 mg/dL   GFR, Estimated >60 >45 mL/min    Comment: (NOTE) Calculated using the CKD-EPI Creatinine Equation (2021) Performed at [facility], [address] [place], [place] [address]   Troponin I (High Sensitivity)     Status: Abnormal   Collection Time: 07/16/23  6:02 PM  Result Value Ref Range   Troponin I (High Sensitivity) 61 (H) <18 ng/L    Comment: (NOTE) Elevated high sensitivity troponin I (hsTnI) values and significant  changes across serial measurements may suggest ACS but many other  chronic and acute conditions are known to elevate hsTnI results.  Refer to the "Links" section for chest pain algorithms and additional  guidance. Performed at [facility], [address], [place], [place] [address]   Troponin I (High Sensitivity)     Status: Abnormal   Collection Time: 07/16/23  8:26 PM  Result Value Ref Range   Troponin I (High Sensitivity) 60 (H) <18 ng/L    Comment: (NOTE) Elevated high sensitivity troponin I (hsTnI) values and significant  changes across serial measurements may suggest ACS but many other  chronic and acute conditions are known to elevate hsTnI results.  Refer to the "Links" section for chest pain algorithms and additional  guidance. Performed at [facility], [address] [place], [place] [address]   Basic metabolic panel     Status: Abnormal   Collection Time: 07/17/23  4:31 AM  Result Value Ref Range   Sodium 134 (L) 135 - 145 mmol/L   Potassium 3.6 3.5 - 5.1 mmol/L   Chloride 105 98 - 111 mmol/L   CO2 20  (L) 22 - 32 mmol/L   Glucose, Bld 155 (H) 70 - 99 mg/dL    Comment: Glucose reference range applies only to samples taken after fasting for at least 8 hours.   BUN 13 6 - 20 mg/dL   Creatinine, Ser 2.13 0.44 - 1.00 mg/dL   Calcium 8.8 (L) 8.9 - 10.3 mg/dL   GFR, Estimated >08 >65 mL/min    Comment: (NOTE) Calculated using the CKD-EPI Creatinine Equation (2021)    Anion gap 9 5 - 15    Comment: Performed at [facility], [address], [place], [place] [address]  CBC     Status: Abnormal   Collection Time: 07/17/23  4:31 AM  Result Value Ref Range   WBC 9.5 4.0 - 10.5 K/uL   RBC 3.18 (L) 3.87 - 5.11 MIL/uL   Hemoglobin 10.4 (L) 12.0 - 15.0 g/dL   HCT 62.9 (L) 52.8 - 41.3 %   MCV 98.4 80.0 - 100.0 fL   MCH 32.7 26.0 - 34.0 pg   MCHC 33.2 30.0 - 36.0 g/dL   RDW 24.4 01.0 - 27.2 %   Platelets 202 150 - 400 K/uL   nRBC 0.0 0.0 - 0.2 %    Comment: Performed at [facility], [address], [place], [place] [address]  Glucose, capillary     Status: Abnormal   Collection Time: 07/17/23  7:28 AM  Result Value Ref Range   Glucose-Capillary 176 (H) 70 - 99 mg/dL    Comment: Glucose reference range applies only to samples taken after fasting for at least 8 hours.  Glucose,  capillary     Status: Abnormal   Collection Time: 07/17/23 11:21 AM  Result Value Ref Range   Glucose-Capillary 150 (H) 70 - 99 mg/dL    Comment: Glucose reference range applies only to samples taken after fasting for at least 8 hours.  Glucose, capillary     Status: Abnormal   Collection Time: 07/17/23  4:14 PM  Result Value Ref Range   Glucose-Capillary 133 (H) 70 - 99 mg/dL    Comment: Glucose reference range applies only to samples taken after fasting for at least 8 hours.  Glucose, capillary     Status: Abnormal   Collection Time: 07/17/23  9:07 PM  Result Value Ref Range   Glucose-Capillary 126 (H) 70 - 99 mg/dL    Comment: Glucose reference range applies only to samples taken after fasting for  at least 8 hours.  Glucose, capillary     Status: Abnormal   Collection Time: 07/18/23  8:01 AM  Result Value Ref Range   Glucose-Capillary 156 (H) 70 - 99 mg/dL    Comment: Glucose reference range applies only to samples taken after fasting for at least 8 hours.    ECG   Normal sinus rhythm with low voltage- Personally Reviewed  Telemetry   Normal sinus rhythm- Personally Reviewed  Radiology    ECHOCARDIOGRAM COMPLETE  Result Date: 07/17/2023    ECHOCARDIOGRAM REPORT   Patient Name:   Windell Moment Date of Exam: 07/17/2023 Medical Rec #:  [id number]           Height:       63.5 in Accession #:    [id number]          Weight:       320.0 lb Date of Birth:  1981-04-27          BSA:          2.375 m Patient Age:    41 years            BP:           125/94 mmHg Patient Gender: F                   HR:           84 bpm. Exam Location:  Inpatient Procedure: 2D Echo, Cardiac Doppler and Color Doppler Indications:    Ventricular Tachycardia I47.2  History:        Patient has prior history of Echocardiogram examinations, most                 recent 04/16/2020. Arrythmias:Tachycardia; Risk Factors:Diabetes                 and Non-Smoker.  Sonographer:    Aron Baba Referring Phys: Sharla Kidney [facility]  Sonographer Comments: Patient is obese and Technically difficult study due to poor echo windows. IMPRESSIONS  1. Limited echo windows- Definity contrast was not administered. Left ventricular ejection fraction, by estimation, is 60 to 65%. The left ventricle has normal function. The left ventricle has no regional wall motion abnormalities. There is moderate left ventricular hypertrophy. Left ventricular diastolic parameters are consistent with Grade I diastolic dysfunction (impaired relaxation).  2. Poorly visualized, appears underfilled. RA and RV collapse noted. RVEF is reduced. Right ventricular systolic function is moderately reduced. The right ventricular size is small. There is normal pulmonary  artery systolic pressure. The estimated right  ventricular systolic pressure is 11.7 mmHg.  3. Cannot rule-out tamponade physiology. Large pericardial  effusion. The pericardial effusion is circumferential.  4. The mitral valve is grossly normal. Trivial mitral valve regurgitation.  5. The aortic valve was not well visualized. Aortic valve regurgitation is not visualized. No aortic stenosis is present.  6. IVC is poorly visualized, but does not appear dilated. Comparison(s): Changes from prior study are noted. 04/16/2020: LVEF 65-70%, moderate LVH, normal RV function, trivial pericardial effusion. Conclusion(s)/Recommendation(s): Large pericardial effusion - cannot exclude tamponade physiology. See clinical evalutions and recommendations in my progress note. FINDINGS  Left Ventricle: Limited echo windows- Definity contrast was not administered. Left ventricular ejection fraction, by estimation, is 60 to 65%. The left ventricle has normal function. The left ventricle has no regional wall motion abnormalities. The left  ventricular internal cavity size was normal in size. There is moderate left ventricular hypertrophy. Left ventricular diastolic parameters are consistent with Grade I diastolic dysfunction (impaired relaxation). Indeterminate filling pressures. Right Ventricle: Poorly visualized, appears underfilled. RA and RV collapse noted. RVEF is reduced. The right ventricular size is small. Right vetricular wall thickness was not well visualized. Right ventricular systolic function is moderately reduced. There is normal pulmonary artery systolic pressure. The tricuspid regurgitant velocity is 0.96 m/s, and with an assumed right atrial pressure of 8 mmHg, the estimated right ventricular systolic pressure is 11.7 mmHg. Left Atrium: Left atrial size was normal in size. Right Atrium: Right atrial size was normal in size. Pericardium: Cannot rule-out tamponade physiology. A large pericardial effusion is present. The  pericardial effusion is circumferential. There is diastolic collapse of the right ventricular free wall and diastolic collapse of the right atrial wall. Mitral Valve: The mitral valve is grossly normal. Trivial mitral valve regurgitation. Tricuspid Valve: The tricuspid valve is not well visualized. Tricuspid valve regurgitation is trivial. Aortic Valve: The aortic valve was not well visualized. Aortic valve regurgitation is not visualized. No aortic stenosis is present. Pulmonic Valve: The pulmonic valve was not well visualized. Pulmonic valve regurgitation is not visualized. Aorta: The aortic root and ascending aorta are structurally normal, with no evidence of dilitation. Venous: IVC is poorly visualized, but does not appear dilated. The inferior vena cava was not well visualized. IAS/Shunts: The interatrial septum was not well visualized.  LEFT VENTRICLE PLAX 2D LVIDd:         3.90 cm   Diastology LVIDs:         2.90 cm   LV e' medial:    5.98 cm/s LV PW:         1.10 cm   LV E/e' medial:  15.1 LV IVS:        0.80 cm   LV e' lateral:   7.62 cm/s LVOT diam:     1.80 cm   LV E/e' lateral: 11.9 LV SV:         54 LV SV Index:   23 LVOT Area:     2.54 cm  RIGHT VENTRICLE RV S prime:     4.57 cm/s TAPSE (M-mode): 1.3 cm LEFT ATRIUM             Index        RIGHT ATRIUM           Index LA diam:        3.20 cm 1.35 cm/m   RA Area:     24.10 cm LA Vol (A2C):   30.8 ml 12.97 ml/m  RA Volume:   77.50 ml  32.63 ml/m LA Vol (A4C):   74.9 ml 31.54 ml/m LA Biplane  Vol: 51.8 ml 21.81 ml/m  AORTIC VALVE LVOT Vmax:   121.00 cm/s LVOT Vmean:  79.300 cm/s LVOT VTI:    0.213 m  AORTA Ao Root diam: 3.20 cm Ao Asc diam:  3.20 cm MITRAL VALVE               TRICUSPID VALVE MV Area (PHT): 4.41 cm    TR Peak grad:   3.7 mmHg MV Decel Time: 172 msec    TR Vmax:        95.60 cm/s MR Peak grad: 3.9 mmHg MR Vmax:      99.10 cm/s   SHUNTS MV E velocity: 90.40 cm/s  Systemic VTI:  0.21 m MV A velocity: 85.30 cm/s  Systemic Diam: 1.80 cm  MV E/A ratio:  1.06 Zoila Shutter MD Electronically signed by Zoila Shutter MD Signature Date/Time: 07/17/2023/11:05:48 AM    Final    DG Chest Portable 1 View  Result Date: 07/16/2023 CLINICAL DATA:  Chest pain.  Shortness of breath. EXAM: PORTABLE CHEST 1 VIEW COMPARISON:  None Available. FINDINGS: Low lung volume. Bilateral lung fields are clear. Bilateral lateral costophrenic angles are clear. Mildly enlarged cardio-mediastinal silhouette, even considering portable AP technique. No acute osseous abnormalities. The soft tissues are within normal limits. IMPRESSION: 1. Mild cardiomegaly. Electronically Signed   By: Jules Schick M.D.   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 Giffen will need pericardiocentesis for large pericardial effusion - likely tomorrow. Not clear how this is related to VT, however, there are case reports of VT in the setting of profound hypothyroidism. May need a concomitant coronary evaluation as well. Will keep NPO p MN and post for pericardiocentesis tomorrow.  I have discussed the risks, benefit and alternatives of this procedure, including the risks, benefits and alternatives of cardiac catheterization and she understands those risks and is agreeable to proceed.  Informed Consent   Shared Decision Making/Informed Consent The risks [stroke (1 in 1000), death (1 in 1000), kidney failure [usually temporary] (1 in 500), bleeding (1 in 200), allergic reaction [possibly serious] (1 in 200)], benefits (diagnostic support and management of coronary artery disease) and alternatives of a cardiac catheterization were discussed in detail with Ms. Buchannon and she is willing to proceed.   The risks  [bleeding, infection, cardiac arrest, damage to the heart or blood vessels requiring surgery, pneumothorax requiring chest tube placement, arrhythmias, changes in blood pressure, organ injury, and rarely death], benefits [therapeutic relief of fluid collection around the heart, diagnostic support], and alternatives of a pericardiocentesis were discussed in detail with Ms. Ocejo and she agrees to proceed.    Time Spent Directly with Patient:  I have spent a total of 25 minutes with the patient reviewing hospital notes, telemetry, EKGs, labs and examining the patient as well as establishing an assessment and plan that was discussed personally with the patient.  > 50% of time was spent in direct patient care.  Length of Stay:  LOS: 1 day   Chrystie Nose, MD, [facility], FACP  [place]  [facility]  Medical Director of the [facility] Lipid [facility] &  Cardiovascular Risk Reduction Clinic Diplomate of the American Board of Clinical Lipidology Attending Cardiologist  Direct Dial: [phone]  Fax: [phone]  Website:  [email or web address] Nicely Landra Howze 07/18/2023, 10:10 AM

## 2023-07-18 NOTE — Progress Notes (Signed)
PROGRESS NOTE    Joanne Harris  GNF:[id number] DOB: 11/08/1980 DOA: 07/16/2023 PCP: [name], MD   Brief Narrative:  Joanne Harris is a 42 y.o. female with medical history significant of DM-2, post ablative hypothyroidism, anxiety-who started having palpitations prior to hospitalization, patient admits profound noncompliance with home thyroid supplementation, TSH is markedly elevated subsequently noted to have palpitations.  Outside facility noted wide-complex tachycardia with worsening shortness of breath and questionable chest pain undergoing synchronized cardioversion currently under sinus rhythm admitted to [facility] with cardiology consult.   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   DEPRESSION/ANXIETY   Type 2 diabetes mellitus with hyperglycemia (HCC)   Postablative hypothyroidism   Pericardial effusion  Large pericardial effusion -Known to be associated with longstanding hypothyroidism, cardiology recommending pericardiocentesis -this can be done non-urgently given no signs or symptoms of tamponade  Acute symptomatic hypothyroidism secondary to noncompliance, POA -Patient admits to noncompliance at least 3 to 4 months, TSH per chart review remains markedly elevated over the last year -Tachydysrhythmia resolved with cardioversion -Continue thyroid supplementation  Wide-complex tachycardia Likely atrial flutter with aberrancy vs VT per cardiology Synchronized cardioversion at outside facility, remains in sinus rhythm Follow electrolytes  Non-insulin-dependent DM-2 -uncontrolled with hyperglycemia -Again secondary to noncompliance with medication and lifestyle -A1c 10.3 confirming profoundly uncontrolled hyperglycemia -Recently restarted metformin -Will likely transition to long-acting insulin based on sliding scale needs given her profound hyperglycemia -  patient continues to refuse sliding scale insulin  Morbid Obesity  Body mass index is 55.8 kg/m.   DVT prophylaxis: Lovenox *(Of note patient has been intermittently declining this medication administration) -recommend increased activity Code Status:   Code Status: Full Code Family Communication: None present  Status is: Inpatient  Dispo: The patient is from: Home              Anticipated d/c is to: Home              Anticipated d/c date is: 72+ hours pending clinical course and cardiology procedure              Patient currently not medically stable for discharge  Consultants:  Cardiology  Procedures:  Planned pericardiocentesis 07/19/2023  Antimicrobials:  None indicated  Subjective: No acute issues or events overnight patient denies nausea vomiting diarrhea constipation headache fevers chills chest pain shortness of breath.  No further episodes of palpitations or chest discomfort.  Objective: Vitals:   09/Joanne/24 2136 07/18/23 0000 07/18/23 0400 07/18/23 0415  BP: (!) 147/89   126/83  Pulse: 90   85  Resp:    18  Temp:    98.5 F (36.9 C)  TempSrc:    Oral  SpO2:  98% 98% 94%  Weight:      Height:       No intake or output data in the 24 hours ending 07/18/23 0759 Filed Weights   07/16/23 0955 07/16/23 1623  Weight: (!) 145.2 kg (!) 145.2 kg    Examination:  General:  Pleasantly resting in bed, No acute distress. HEENT:  Normocephalic atraumatic.  Sclerae nonicteric, noninjected.  Extraocular movements intact bilaterally. Neck:  Without mass or deformity.  Trachea is midline. Lungs:  Clear to auscultate bilaterally without rhonchi, wheeze, or rales. Heart:  Regular rate and rhythm.  Without murmurs, rubs, or gallops. Abdomen:  Soft, obese, nontender, nondistended.  Without guarding or rebound. Extremities: Without cyanosis, clubbing, edema,  or obvious deformity. Skin:  Warm and dry, no erythema.  Data Reviewed: I have personally reviewed following labs and  imaging studies  CBC: Recent Labs  Lab 07/16/23 1012 07/16/23 1802 09/Joanne/24 0431  WBC 12.9* 10.4 9.5  HGB 12.9 12.1 10.4*  HCT 38.0 36.4 31.3*  MCV 96.9 97.8 98.4  PLT 395 373 202   Basic Metabolic Panel: Recent Labs  Lab 07/16/23 1012 07/16/23 1802 09/Joanne/24 0431  NA 135  --  134*  K 3.5  --  3.6  CL 101  --  105  CO2 19*  --  20*  GLUCOSE 248*  --  155*  BUN 15  --  13  CREATININE 1.10* 0.96 0.95  CALCIUM 9.0  --  8.8*  MG 2.0  --   --    GFR: Estimated Creatinine Clearance: 112.1 mL/min (by C-G formula based on SCr of 0.95 mg/dL).  CBG: Recent Labs  Lab 09/Joanne/24 0728 09/Joanne/24 1121 09/Joanne/24 1614 09/Joanne/24 2107  GLUCAP 176* 150* 133* 126*   Thyroid Function Tests: Recent Labs    07/16/23 1012 07/16/23 1320  TSH 121.225*  --   FREET4 <0.25*  --   T3FREE  --  <0.3*    No results found for this or any previous visit (from the past 240 hour(s)).   Radiology Studies: ECHOCARDIOGRAM COMPLETE  Result Date: 9/Joanne/2024    ECHOCARDIOGRAM REPORT   Patient Name:   Joanne Harris Date of Exam: 9/Joanne/2024 Medical Rec #:  [id number]           Height:       63.5 in Accession #:    [id number]          Weight:       320.0 lb Date of Birth:  1981-05-12          BSA:          2.375 m Patient Age:    41 years            BP:           125/94 mmHg Patient Gender: F                   HR:           84 bpm. Exam Location:  Inpatient Procedure: 2D Echo, Cardiac Doppler and Color Doppler Indications:    Ventricular Tachycardia I47.2  History:        Patient has prior history of Echocardiogram examinations, most                 recent 04/16/2020. Arrythmias:Tachycardia; Risk Factors:Diabetes                 and Non-Smoker.  Sonographer:    Aron Baba Referring Phys: [facility]  Sonographer Comments: Patient is obese and Technically difficult study due to poor echo windows. IMPRESSIONS  1. Limited echo windows- Definity contrast was not administered. Left ventricular  ejection fraction, by estimation, is 60 to 65%. The left ventricle has normal function. The left ventricle has no regional wall motion abnormalities. There is moderate left ventricular hypertrophy. Left ventricular diastolic parameters are consistent with Grade I diastolic dysfunction (impaired relaxation).  2. Poorly visualized, appears underfilled. RA and RV collapse noted. RVEF is reduced. Right ventricular systolic function is moderately reduced. The right ventricular size is small. There is normal pulmonary artery systolic pressure. The estimated right  ventricular systolic pressure is 11.7 mmHg.  3. Cannot rule-out tamponade physiology.  or obvious deformity. Skin:  Warm and dry, no erythema.  Data Reviewed: I have personally reviewed following labs and  imaging studies  CBC: Recent Labs  Lab 07/16/23 1012 07/16/23 1802 09/Joanne/24 0431  WBC 12.9* 10.4 9.5  HGB 12.9 12.1 10.4*  HCT 38.0 36.4 31.3*  MCV 96.9 97.8 98.4  PLT 395 373 202   Basic Metabolic Panel: Recent Labs  Lab 07/16/23 1012 07/16/23 1802 09/Joanne/24 0431  NA 135  --  134*  K 3.5  --  3.6  CL 101  --  105  CO2 19*  --  20*  GLUCOSE 248*  --  155*  BUN 15  --  13  CREATININE 1.10* 0.96 0.95  CALCIUM 9.0  --  8.8*  MG 2.0  --   --    GFR: Estimated Creatinine Clearance: 112.1 mL/min (by C-G formula based on SCr of 0.95 mg/dL).  CBG: Recent Labs  Lab 09/Joanne/24 0728 09/Joanne/24 1121 09/Joanne/24 1614 09/Joanne/24 2107  GLUCAP 176* 150* 133* 126*   Thyroid Function Tests: Recent Labs    07/16/23 1012 07/16/23 1320  TSH 121.225*  --   FREET4 <0.25*  --   T3FREE  --  <0.3*    No results found for this or any previous visit (from the past 240 hour(s)).   Radiology Studies: ECHOCARDIOGRAM COMPLETE  Result Date: 9/Joanne/2024    ECHOCARDIOGRAM REPORT   Patient Name:   Joanne Harris Date of Exam: 9/Joanne/2024 Medical Rec #:  [id number]           Height:       63.5 in Accession #:    [id number]          Weight:       320.0 lb Date of Birth:  1981-05-12          BSA:          2.375 m Patient Age:    41 years            BP:           125/94 mmHg Patient Gender: F                   HR:           84 bpm. Exam Location:  Inpatient Procedure: 2D Echo, Cardiac Doppler and Color Doppler Indications:    Ventricular Tachycardia I47.2  History:        Patient has prior history of Echocardiogram examinations, most                 recent 04/16/2020. Arrythmias:Tachycardia; Risk Factors:Diabetes                 and Non-Smoker.  Sonographer:    Aron Baba Referring Phys: [facility]  Sonographer Comments: Patient is obese and Technically difficult study due to poor echo windows. IMPRESSIONS  1. Limited echo windows- Definity contrast was not administered. Left ventricular  ejection fraction, by estimation, is 60 to 65%. The left ventricle has normal function. The left ventricle has no regional wall motion abnormalities. There is moderate left ventricular hypertrophy. Left ventricular diastolic parameters are consistent with Grade I diastolic dysfunction (impaired relaxation).  2. Poorly visualized, appears underfilled. RA and RV collapse noted. RVEF is reduced. Right ventricular systolic function is moderately reduced. The right ventricular size is small. There is normal pulmonary artery systolic pressure. The estimated right  ventricular systolic pressure is 11.7 mmHg.  3. Cannot rule-out tamponade physiology.  or obvious deformity. Skin:  Warm and dry, no erythema.  Data Reviewed: I have personally reviewed following labs and  imaging studies  CBC: Recent Labs  Lab 07/16/23 1012 07/16/23 1802 09/Joanne/24 0431  WBC 12.9* 10.4 9.5  HGB 12.9 12.1 10.4*  HCT 38.0 36.4 31.3*  MCV 96.9 97.8 98.4  PLT 395 373 202   Basic Metabolic Panel: Recent Labs  Lab 07/16/23 1012 07/16/23 1802 09/Joanne/24 0431  NA 135  --  134*  K 3.5  --  3.6  CL 101  --  105  CO2 19*  --  20*  GLUCOSE 248*  --  155*  BUN 15  --  13  CREATININE 1.10* 0.96 0.95  CALCIUM 9.0  --  8.8*  MG 2.0  --   --    GFR: Estimated Creatinine Clearance: 112.1 mL/min (by C-G formula based on SCr of 0.95 mg/dL).  CBG: Recent Labs  Lab 09/Joanne/24 0728 09/Joanne/24 1121 09/Joanne/24 1614 09/Joanne/24 2107  GLUCAP 176* 150* 133* 126*   Thyroid Function Tests: Recent Labs    07/16/23 1012 07/16/23 1320  TSH 121.225*  --   FREET4 <0.25*  --   T3FREE  --  <0.3*    No results found for this or any previous visit (from the past 240 hour(s)).   Radiology Studies: ECHOCARDIOGRAM COMPLETE  Result Date: 9/Joanne/2024    ECHOCARDIOGRAM REPORT   Patient Name:   Joanne Harris Date of Exam: 9/Joanne/2024 Medical Rec #:  [id number]           Height:       63.5 in Accession #:    [id number]          Weight:       320.0 lb Date of Birth:  1981-05-12          BSA:          2.375 m Patient Age:    41 years            BP:           125/94 mmHg Patient Gender: F                   HR:           84 bpm. Exam Location:  Inpatient Procedure: 2D Echo, Cardiac Doppler and Color Doppler Indications:    Ventricular Tachycardia I47.2  History:        Patient has prior history of Echocardiogram examinations, most                 recent 04/16/2020. Arrythmias:Tachycardia; Risk Factors:Diabetes                 and Non-Smoker.  Sonographer:    Aron Baba Referring Phys: [facility]  Sonographer Comments: Patient is obese and Technically difficult study due to poor echo windows. IMPRESSIONS  1. Limited echo windows- Definity contrast was not administered. Left ventricular  ejection fraction, by estimation, is 60 to 65%. The left ventricle has normal function. The left ventricle has no regional wall motion abnormalities. There is moderate left ventricular hypertrophy. Left ventricular diastolic parameters are consistent with Grade I diastolic dysfunction (impaired relaxation).  2. Poorly visualized, appears underfilled. RA and RV collapse noted. RVEF is reduced. Right ventricular systolic function is moderately reduced. The right ventricular size is small. There is normal pulmonary artery systolic pressure. The estimated right  ventricular systolic pressure is 11.7 mmHg.  3. Cannot rule-out tamponade physiology.

## 2023-07-19 ENCOUNTER — Inpatient Hospital Stay (HOSPITAL_COMMUNITY)
Admission: EM | Disposition: A | Discharge status: Home / Self Care | Payer: Self-pay | Source: Home / Self Care | Attending: Internal Medicine

## 2023-07-19 DIAGNOSIS — I472 Ventricular tachycardia, unspecified: Secondary | ICD-10-CM | POA: Diagnosis not present

## 2023-07-19 DIAGNOSIS — I3139 Other pericardial effusion (noninflammatory): Secondary | ICD-10-CM | POA: Diagnosis not present

## 2023-07-19 HISTORY — PX: RIGHT/LEFT HEART CATH AND CORONARY ANGIOGRAPHY: CATH118266

## 2023-07-19 LAB — POCT I-STAT EG7
Acid-Base Excess: 0 mmol/L (ref 0.0–2.0)
Acid-Base Excess: 0 mmol/L (ref 0.0–2.0)
Bicarbonate: 25.1 mmol/L (ref 20.0–28.0)
Bicarbonate: 24.8 mmol/L (ref 20.0–28.0)
Calcium, Ion: 1.18 mmol/L (ref 1.15–1.40)
Calcium, Ion: 1.19 mmol/L (ref 1.15–1.40)
HCT: 33 % — ABNORMAL LOW (ref 36.0–46.0)
HCT: 34 % — ABNORMAL LOW (ref 36.0–46.0)
Hemoglobin: 11.2 g/dL — ABNORMAL LOW (ref 12.0–15.0)
Hemoglobin: 11.6 g/dL — ABNORMAL LOW (ref 12.0–15.0)
O2 Saturation: 56 %
O2 Saturation: 58 %
Potassium: 3.5 mmol/L (ref 3.5–5.1)
Potassium: 3.5 mmol/L (ref 3.5–5.1)
Sodium: 138 mmol/L (ref 135–145)
Sodium: 137 mmol/L (ref 135–145)
TCO2: 26 mmol/L (ref 22–32)
TCO2: 26 mmol/L (ref 22–32)
pCO2, Ven: 40.6 mmHg — ABNORMAL LOW (ref 44–60)
pCO2, Ven: 40.4 mmHg — ABNORMAL LOW (ref 44–60)
pH, Ven: 7.399 (ref 7.25–7.43)
pH, Ven: 7.397 (ref 7.25–7.43)
pO2, Ven: 29 mmHg — CL (ref 32–45)
pO2, Ven: 30 mmHg — CL (ref 32–45)

## 2023-07-19 LAB — POCT I-STAT 7, (LYTES, BLD GAS, ICA,H+H)
Acid-base deficit: 1 mmol/L (ref 0.0–2.0)
Bicarbonate: 23.1 mmol/L (ref 20.0–28.0)
Calcium, Ion: 1.19 mmol/L (ref 1.15–1.40)
HCT: 32 % — ABNORMAL LOW (ref 36.0–46.0)
Hemoglobin: 10.9 g/dL — ABNORMAL LOW (ref 12.0–15.0)
O2 Saturation: 91 %
Potassium: 3.5 mmol/L (ref 3.5–5.1)
Sodium: 137 mmol/L (ref 135–145)
TCO2: 24 mmol/L (ref 22–32)
pCO2 arterial: 35.4 mmHg (ref 32–48)
pH, Arterial: 7.423 (ref 7.35–7.45)
pO2, Arterial: 59 mmHg — ABNORMAL LOW (ref 83–108)

## 2023-07-19 LAB — BASIC METABOLIC PANEL
Anion gap: 8 (ref 5–15)
BUN: 8 mg/dL (ref 6–20)
CO2: 26 mmol/L (ref 22–32)
Calcium: 9.1 mg/dL (ref 8.9–10.3)
Chloride: 101 mmol/L (ref 98–111)
Creatinine, Ser: 1.03 mg/dL — ABNORMAL HIGH (ref 0.44–1.00)
GFR, Estimated: >60 mL/min (ref >60)
Glucose, Bld: 123 mg/dL — ABNORMAL HIGH (ref 70–99)
Potassium: 3.2 mmol/L — ABNORMAL LOW (ref 3.5–5.1)
Sodium: 135 mmol/L (ref 135–145)

## 2023-07-19 LAB — HEMOGLOBIN A1C
Hgb A1c MFr Bld: 8.6 % — ABNORMAL HIGH (ref 4.8–5.6)
Mean Plasma Glucose: 200 mg/dL

## 2023-07-19 LAB — GLUCOSE, CAPILLARY
Glucose-Capillary: 154 mg/dL — ABNORMAL HIGH (ref 70–99)
Glucose-Capillary: 116 mg/dL — ABNORMAL HIGH (ref 70–99)
Glucose-Capillary: 123 mg/dL — ABNORMAL HIGH (ref 70–99)

## 2023-07-19 LAB — PREGNANCY, URINE: Preg Test, Ur: NEGATIVE

## 2023-07-19 LAB — MAGNESIUM: Magnesium: 2 mg/dL (ref 1.7–2.4)

## 2023-07-19 SURGERY — RIGHT/LEFT HEART CATH AND CORONARY ANGIOGRAPHY
Anesthesia: LOCAL

## 2023-07-19 MED ORDER — HYDRALAZINE HCL 20 MG/ML IJ SOLN: 10.0000 mg | INTRAMUSCULAR | Status: AC | PRN

## 2023-07-19 MED ORDER — HEPARIN (PORCINE) IN NACL 1000-0.9 UT/500ML-% IV SOLN
INTRAVENOUS | Status: DC | PRN
  Administered 2023-07-19 (×2): 500 mL

## 2023-07-19 MED ORDER — SODIUM CHLORIDE 0.9 % IV SOLN: INTRAVENOUS | Status: DC

## 2023-07-19 MED ORDER — FENTANYL CITRATE (PF) 100 MCG/2ML IJ SOLN
INTRAMUSCULAR | Status: AC
  Filled 2023-07-19: qty 2

## 2023-07-19 MED ORDER — METOPROLOL SUCCINATE ER 25 MG PO TB24
25.0000 mg | ORAL_TABLET | Freq: Every day | ORAL | Status: DC
  Administered 2023-07-19 – 2023-07-21 (×3): 25 mg via ORAL
  Filled 2023-07-19 (×3): qty 1

## 2023-07-19 MED ORDER — SODIUM CHLORIDE 0.9 % IV SOLN: 250.0000 mL | INTRAVENOUS | Status: DC | PRN

## 2023-07-19 MED ORDER — POTASSIUM CHLORIDE CRYS ER 20 MEQ PO TBCR
40.0000 meq | EXTENDED_RELEASE_TABLET | ORAL | Status: AC
  Administered 2023-07-19 (×2): 40 meq via ORAL
  Filled 2023-07-19 (×2): qty 2

## 2023-07-19 MED ORDER — LIDOCAINE HCL (PF) 1 % IJ SOLN
INTRAMUSCULAR | Status: DC | PRN
  Administered 2023-07-19: 2 mL via INTRADERMAL
  Administered 2023-07-19: 5 mL via INTRADERMAL
  Administered 2023-07-19: 2 mL via INTRADERMAL

## 2023-07-19 MED ORDER — IOHEXOL 350 MG/ML SOLN
INTRAVENOUS | Status: DC | PRN
  Administered 2023-07-19: 27 mL

## 2023-07-19 MED ORDER — LIDOCAINE HCL (PF) 1 % IJ SOLN
INTRAMUSCULAR | Status: AC
  Filled 2023-07-19: qty 30

## 2023-07-19 MED ORDER — POTASSIUM CHLORIDE CRYS ER 20 MEQ PO TBCR: 40.0000 meq | EXTENDED_RELEASE_TABLET | Freq: Two times a day (BID) | ORAL | Status: DC

## 2023-07-19 MED ORDER — SODIUM CHLORIDE 0.9% FLUSH: 3.0000 mL | INTRAVENOUS | Status: DC | PRN

## 2023-07-19 MED ORDER — VERAPAMIL HCL 2.5 MG/ML IV SOLN
INTRAVENOUS | Status: AC
  Filled 2023-07-19: qty 2

## 2023-07-19 MED ORDER — FUROSEMIDE 10 MG/ML IJ SOLN
20.0000 mg | Freq: Once | INTRAMUSCULAR | Status: AC
  Administered 2023-07-20: 20 mg via INTRAVENOUS
  Filled 2023-07-19: qty 2

## 2023-07-19 MED ORDER — MIDAZOLAM HCL 2 MG/2ML IJ SOLN
INTRAMUSCULAR | Status: AC
  Filled 2023-07-19: qty 2

## 2023-07-19 MED ORDER — FENTANYL CITRATE (PF) 100 MCG/2ML IJ SOLN
INTRAMUSCULAR | Status: DC | PRN
  Administered 2023-07-19: 25 ug via INTRAVENOUS

## 2023-07-19 MED ORDER — MIDAZOLAM HCL 2 MG/2ML IJ SOLN
INTRAMUSCULAR | Status: DC | PRN
  Administered 2023-07-19: 1 mg via INTRAVENOUS

## 2023-07-19 MED ORDER — LABETALOL HCL 5 MG/ML IV SOLN: 10.0000 mg | INTRAVENOUS | Status: AC | PRN

## 2023-07-19 MED ORDER — SODIUM CHLORIDE 0.9% FLUSH
3.0000 mL | Freq: Two times a day (BID) | INTRAVENOUS | Status: DC
  Administered 2023-07-19 – 2023-07-21 (×3): 3 mL via INTRAVENOUS

## 2023-07-19 SURGICAL SUPPLY — 15 items
CATH BALLN WEDGE 5F 110CM (CATHETERS) IMPLANT
CATH INFINITI 5FR MULTPACK ANG (CATHETERS) IMPLANT
ELECT DEFIB PAD ADLT CADENCE (PAD) IMPLANT
GLIDESHEATH SLEND SS 6F .021 (SHEATH) IMPLANT
GUIDEWIRE INQWIRE 1.5J.035X260 (WIRE) IMPLANT
INQWIRE 1.5J .035X260CM (WIRE) ×1
KIT MICROPUNCTURE NIT STIFF (SHEATH) IMPLANT
PACK CARDIAC CATHETERIZATION (CUSTOM PROCEDURE TRAY) ×1 IMPLANT
PROTECTION STATION PRESSURIZED (MISCELLANEOUS) ×1
SET ATX-X65L (MISCELLANEOUS) IMPLANT
SHEATH GLIDE SLENDER 4/5FR (SHEATH) IMPLANT
SHEATH PINNACLE 5F 10CM (SHEATH) IMPLANT
SHEATH PROBE COVER 6X72 (BAG) IMPLANT
STATION PROTECTION PRESSURIZED (MISCELLANEOUS) IMPLANT
WIRE EMERALD 3MM-J .035X150CM (WIRE) IMPLANT

## 2023-07-19 NOTE — Progress Notes (Addendum)
PROGRESS NOTE    Joanne Harris  ZOX:[id number] DOB: [birth year] DOA: 07/16/2023 PCP: [name], MD   Brief Narrative:  Joanne Harris is a 42 y.o. female with medical history significant of DM-2, post ablative hypothyroidism, anxiety-who started having palpitations prior to hospitalization, patient admits profound noncompliance with home thyroid supplementation, TSH is markedly elevated subsequently noted to have palpitations.  Outside facility noted wide-complex tachycardia with worsening shortness of breath and questionable chest pain undergoing synchronized cardioversion currently under sinus rhythm admitted to [facility] with cardiology consult.   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   DEPRESSION/ANXIETY   Type 2 diabetes mellitus with hyperglycemia (HCC)   Postablative hypothyroidism   Pericardial effusion  Wide-complex tachycardia Likely atrial flutter with aberrancy vs VT per cardiology EP to follow-up with cardiology, cardiac catheterization today Cardiac MRI pending Synchronized cardioversion at outside facility, remains in sinus rhythm Follow electrolytes  Large pericardial effusion -Known to be associated with longstanding hypothyroidism, cardiology recommending pericardiocentesis -unfortunately no good window for pericardiocentesis at this time  Acute symptomatic hypothyroidism secondary to noncompliance, POA -Patient admits to noncompliance at least 3 to 4 months, TSH per chart review remains markedly elevated over the last year -Tachydysrhythmia resolved with cardioversion -Continue thyroid supplementation  Non-insulin-dependent DM-2 -uncontrolled with hyperglycemia -Again secondary to noncompliance with medication and lifestyle -A1c 10.3 confirming profoundly uncontrolled hyperglycemia -Recently restarted metformin -Will likely transition to  long-acting insulin based on sliding scale needs given her profound hyperglycemia  Morbid Obesity  Body mass index is 55.62 kg/m.   DVT prophylaxis: Lovenox *(Of note patient has been intermittently declining this medication administration) -recommend increased activity/ambulation ad lib Code Status:   Code Status: Full Code Family Communication: None present  Status is: Inpatient  Dispo: The patient is from: Home              Anticipated d/c is to: Home              Anticipated d/c date is: 72+ hours pending clinical course and cardiology procedure              Patient currently not medically stable for discharge  Consultants:  Cardiology  Procedures:  Planned pericardiocentesis 07/19/2023  Antimicrobials:  None indicated  Subjective: No acute issues or events overnight patient denies nausea vomiting diarrhea constipation headache fevers chills chest pain shortness of breath.  No further episodes of palpitations or chest discomfort.  Objective: Vitals:   07/18/23 1243 07/18/23 1953 07/19/23 0419 07/19/23 0549  BP: 104/82 133/77 (!) 133/93   Pulse: 80 86 79   Resp: 16 18 18    Temp: 98.8 F (37.1 C) 98.8 F (37.1 C) 97.8 F (36.6 C)   TempSrc: Oral Oral Oral   SpO2: 95%  93%   Weight:    (!) 144.7 kg  Height:        Intake/Output Summary (Last 24 hours) at 07/19/2023 0757 Last data filed at 07/18/2023 2200 Gross per 24 hour  Intake 240 ml  Output --  Net 240 ml   Filed Weights   07/16/23 0955 07/16/23 1623 07/19/23 0549  Weight: (!) 145.2 kg (!) 145.2 kg (!) 144.7 kg    Examination:  General:  Pleasantly resting in bed, No acute distress. HEENT:  Normocephalic atraumatic.  Sclerae nonicteric, noninjected.  Extraocular movements intact bilaterally. Neck:  Without mass or deformity.  Trachea is midline. Lungs:  Clear to  auscultate bilaterally without rhonchi, wheeze, or rales. Heart:  Regular rate and rhythm.  Without murmurs, rubs, or gallops. Abdomen:   Soft, obese, nontender, nondistended.  Without guarding or rebound. Extremities: Without cyanosis, clubbing, edema, or obvious deformity. Skin:  Warm and dry, no erythema.  Data Reviewed: I have personally reviewed following labs and imaging studies  CBC: Recent Labs  Lab 07/16/23 1012 07/16/23 1802 07/17/23 0431  WBC 12.9* 10.4 9.5  HGB 12.9 12.1 10.4*  HCT 38.0 36.4 31.3*  MCV 96.9 97.8 98.4  PLT 395 373 202   Basic Metabolic Panel: Recent Labs  Lab 07/16/23 1012 07/16/23 1802 07/17/23 0431  NA 135  --  134*  K 3.5  --  3.6  CL 101  --  105  CO2 19*  --  20*  GLUCOSE 248*  --  155*  BUN 15  --  13  CREATININE 1.10* 0.96 0.95  CALCIUM 9.0  --  8.8*  MG 2.0  --   --    GFR: Estimated Creatinine Clearance: 111.8 mL/min (by C-G formula based on SCr of 0.95 mg/dL).  CBG: Recent Labs  Lab 07/17/23 2107 07/18/23 0801 07/18/23 1247 07/18/23 1705 07/18/23 2151  GLUCAP 126* 156* 132* 135* 133*   Thyroid Function Tests: Recent Labs    07/16/23 1012 07/16/23 1320  TSH 121.225*  --   FREET4 <0.25*  --   T3FREE  --  <0.3*    No results found for this or any previous visit (from the past 240 hour(s)).   Radiology Studies: ECHOCARDIOGRAM COMPLETE  Result Date: 07/17/2023    ECHOCARDIOGRAM REPORT   Patient Name:   [facility] Date of Exam: 07/17/2023 Medical Rec #:  [id number]           Height:       63.5 in Accession #:    [id number]          Weight:       320.0 lb Date of Birth:  1980/12/10          BSA:          2.375 m Patient Age:    41 years            BP:           125/94 mmHg Patient Gender: F                   HR:           84 bpm. Exam Location:  Inpatient Procedure: 2D Echo, Cardiac Doppler and Color Doppler Indications:    Ventricular Tachycardia I47.2  History:        Patient has prior history of Echocardiogram examinations, most                 recent 04/16/2020. Arrythmias:Tachycardia; Risk Factors:Diabetes                 and Non-Smoker.   Sonographer:    Aron Baba Referring Phys: [facility]  Sonographer Comments: Patient is obese and Technically difficult study due to poor echo windows. IMPRESSIONS  1. Limited echo windows- Definity contrast was not administered. Left ventricular ejection fraction, by estimation, is 60 to 65%. The left ventricle has normal function. The left ventricle has no regional wall motion abnormalities. There is moderate left ventricular hypertrophy. Left ventricular diastolic parameters are consistent with Grade I diastolic dysfunction (impaired relaxation).  2. Poorly visualized, appears underfilled. RA and RV collapse noted. RVEF  is reduced. Right ventricular systolic function is moderately reduced. The right ventricular size is small. There is normal pulmonary artery systolic pressure. The estimated right  ventricular systolic pressure is 11.7 mmHg.  3. Cannot rule-out tamponade physiology. Large pericardial effusion. The pericardial effusion is circumferential.  4. The mitral valve is grossly normal. Trivial mitral valve regurgitation.  5. The aortic valve was not well visualized. Aortic valve regurgitation is not visualized. No aortic stenosis is present.  6. IVC is poorly visualized, but does not appear dilated. Comparison(s): Changes from prior study are noted. 04/16/2020: LVEF 65-70%, moderate LVH, normal RV function, trivial pericardial effusion. Conclusion(s)/Recommendation(s): Large pericardial effusion - cannot exclude tamponade physiology. See clinical evalutions and recommendations in my progress note. FINDINGS  Left Ventricle: Limited echo windows- Definity contrast was not administered. Left ventricular ejection fraction, by estimation, is 60 to 65%. The left ventricle has normal function. The left ventricle has no regional wall motion abnormalities. The left  ventricular internal cavity size was normal in size. There is moderate left ventricular hypertrophy. Left ventricular diastolic parameters  are consistent with Grade I diastolic dysfunction (impaired relaxation). Indeterminate filling pressures. Right Ventricle: Poorly visualized, appears underfilled. RA and RV collapse noted. RVEF is reduced. The right ventricular size is small. Right vetricular wall thickness was not well visualized. Right ventricular systolic function is moderately reduced. There is normal pulmonary artery systolic pressure. The tricuspid regurgitant velocity is 0.96 m/s, and with an assumed right atrial pressure of 8 mmHg, the estimated right ventricular systolic pressure is 11.7 mmHg. Left Atrium: Left atrial size was normal in size. Right Atrium: Right atrial size was normal in size. Pericardium: Cannot rule-out tamponade physiology. A large pericardial effusion is present. The pericardial effusion is circumferential. There is diastolic collapse of the right ventricular free wall and diastolic collapse of the right atrial wall. Mitral Valve: The mitral valve is grossly normal. Trivial mitral valve regurgitation. Tricuspid Valve: The tricuspid valve is not well visualized. Tricuspid valve regurgitation is trivial. Aortic Valve: The aortic valve was not well visualized. Aortic valve regurgitation is not visualized. No aortic stenosis is present. Pulmonic Valve: The pulmonic valve was not well visualized. Pulmonic valve regurgitation is not visualized. Aorta: The aortic root and ascending aorta are structurally normal, with no evidence of dilitation. Venous: IVC is poorly visualized, but does not appear dilated. The inferior vena cava was not well visualized. IAS/Shunts: The interatrial septum was not well visualized.  LEFT VENTRICLE PLAX 2D LVIDd:         3.90 cm   Diastology LVIDs:         2.90 cm   LV e' medial:    5.98 cm/s LV PW:         1.10 cm   LV E/e' medial:  15.1 LV IVS:        0.80 cm   LV e' lateral:   7.62 cm/s LVOT diam:     1.80 cm   LV E/e' lateral: 11.9 LV SV:         54 LV SV Index:   23 LVOT Area:     2.54 cm   RIGHT VENTRICLE RV S prime:     4.57 cm/s TAPSE (M-mode): 1.3 cm LEFT ATRIUM             Index        RIGHT ATRIUM           Index LA diam:        3.20 cm 1.35  cm/m   RA Area:     24.10 cm LA Vol (A2C):   30.8 ml 12.97 ml/m  RA Volume:   77.50 ml  32.63 ml/m LA Vol (A4C):   74.9 ml 31.54 ml/m LA Biplane Vol: 51.8 ml 21.81 ml/m  AORTIC VALVE LVOT Vmax:   121.00 cm/s LVOT Vmean:  79.300 cm/s LVOT VTI:    0.213 m  AORTA Ao Root diam: 3.20 cm Ao Asc diam:  3.20 cm MITRAL VALVE               TRICUSPID VALVE MV Area (PHT): 4.41 cm    TR Peak grad:   3.7 mmHg MV Decel Time: 172 msec    TR Vmax:        95.60 cm/s MR Peak grad: 3.9 mmHg MR Vmax:      99.10 cm/s   SHUNTS MV E velocity: 90.40 cm/s  Systemic VTI:  0.21 m MV A velocity: 85.30 cm/s  Systemic Diam: 1.80 cm MV E/A ratio:  1.06 Zoila Shutter MD Electronically signed by Zoila Shutter MD Signature Date/Time: 07/17/2023/11:05:48 AM    Final         Scheduled Meds:  chlorhexidine  60 mL Topical Once   enoxaparin (LOVENOX) injection  70 mg Subcutaneous Q24H   insulin aspart  0-9 Units Subcutaneous TID WC   levothyroxine  50 mcg Oral Q0600   metoprolol succinate  25 mg Oral Daily   sodium chloride flush  3 mL Intravenous Q12H   Continuous Infusions:  sodium chloride     sodium chloride       LOS: 2 days   Time spent:  Azucena Fallen, DO [facility]  If 7PM-7AM, please contact night-coverage [email or web address]  07/19/2023, 7:57 AM

## 2023-07-19 NOTE — Progress Notes (Signed)
Cardiology Progress Note  Patient ID: Joanne Harris MRN: [id number] DOB: 11/19/1980 Date of Encounter: 07/19/2023  Primary Cardiologist: None  Subjective   Chief Complaint: None.   HPI: Telemetry unremarkable.  Denies chest pain or trouble breathing.  Reviewed echo with interventional cardiology.  No good window for pericardiocentesis.  Will proceed with left and right heart catheterization today.  She will need cardiac MRI and EP evaluation.  ROS:  All other ROS reviewed and negative. Pertinent positives noted in the HPI.     Inpatient Medications  Scheduled Meds:  chlorhexidine  60 mL Topical Once   enoxaparin (LOVENOX) injection  70 mg Subcutaneous Q24H   insulin aspart  0-9 Units Subcutaneous TID WC   levothyroxine  50 mcg Oral Q0600   metoprolol succinate  25 mg Oral Daily   sodium chloride flush  3 mL Intravenous Q12H   Continuous Infusions:  sodium chloride     sodium chloride     PRN Meds: sodium chloride, acetaminophen **OR** acetaminophen, hydrALAZINE, levalbuterol, loperamide, melatonin, polyethylene glycol, sodium chloride flush   Vital Signs   Vitals:   07/18/23 1243 07/18/23 1953 07/19/23 0419 07/19/23 0549  BP: 104/82 133/77 (!) 133/93   Pulse: 80 86 79   Resp: 16 18 18    Temp: 98.8 F (37.1 C) 98.8 F (37.1 C) 97.8 F (36.6 C)   TempSrc: Oral Oral Oral   SpO2: 95%  93%   Weight:    (!) 144.7 kg  Height:        Intake/Output Summary (Last 24 hours) at 07/19/2023 0818 Last data filed at 07/18/2023 2200 Gross per 24 hour  Intake 240 ml  Output --  Net 240 ml      07/19/2023    5:49 AM 07/16/2023    4:23 PM 07/16/2023    9:55 AM  Last 3 Weights  Weight (lbs) 319 lb 320 lb 320 lb  Weight (kg) 144.697 kg 145.151 kg 145.151 kg      Telemetry  Overnight telemetry shows sinus rhythm 80s, which I personally reviewed.   ECG  The most recent ECG shows sinus rhythm heart rate 81, low voltage, which I personally reviewed.   Physical Exam    Vitals:   07/18/23 1243 07/18/23 1953 07/19/23 0419 07/19/23 0549  BP: 104/82 133/77 (!) 133/93   Pulse: 80 86 79   Resp: 16 18 18    Temp: 98.8 F (37.1 C) 98.8 F (37.1 C) 97.8 F (36.6 C)   TempSrc: Oral Oral Oral   SpO2: 95%  93%   Weight:    (!) 144.7 kg  Height:        Intake/Output Summary (Last 24 hours) at 07/19/2023 0818 Last data filed at 07/18/2023 2200 Gross per 24 hour  Intake 240 ml  Output --  Net 240 ml       07/19/2023    5:49 AM 07/16/2023    4:23 PM 07/16/2023    9:55 AM  Last 3 Weights  Weight (lbs) 319 lb 320 lb 320 lb  Weight (kg) 144.697 kg 145.151 kg 145.151 kg    Body mass index is 55.62 kg/m.  General: Well nourished, well developed, in no acute distress Head: Atraumatic, normal size  Eyes: PEERLA, EOMI  Neck: Supple, no JVD Endocrine: No thryomegaly Cardiac: Normal S1, S2; RRR; no murmurs, rubs, or gallops Lungs: Clear to auscultation bilaterally, no wheezing, rhonchi or rales  Abd: Soft, nontender, no hepatomegaly  Ext: No edema, pulses 2+ Musculoskeletal: No deformities, BUE  and BLE strength normal and equal Skin: Warm and dry, no rashes   Neuro: Alert and oriented to person, place, time, and situation, CNII-XII grossly intact, no focal deficits  Psych: Normal mood and affect   Labs  High Sensitivity Troponin:   Recent Labs  Lab 07/16/23 1802 07/16/23 2026  TROPONINIHS 61* 60*     Cardiac EnzymesNo results for input(s): "TROPONINI" in the last 168 hours. No results for input(s): "TROPIPOC" in the last 168 hours.  Chemistry Recent Labs  Lab 07/16/23 1012 07/16/23 1802 07/17/23 0431  NA 135  --  134*  K 3.5  --  3.6  CL 101  --  105  CO2 19*  --  20*  GLUCOSE 248*  --  155*  BUN 15  --  13  CREATININE 1.10* 0.96 0.95  CALCIUM 9.0  --  8.8*  GFRNONAA >60 >60 >60  ANIONGAP 15  --  9    Hematology Recent Labs  Lab 07/16/23 1012 07/16/23 1802 07/17/23 0431  WBC 12.9* 10.4 9.5  RBC 3.92 3.72* 3.18*  HGB 12.9 12.1  10.4*  HCT 38.0 36.4 31.3*  MCV 96.9 97.8 98.4  MCH 32.9 32.5 32.7  MCHC 33.9 33.2 33.2  RDW 13.2 13.2 13.3  PLT 395 373 202   BNP Recent Labs  Lab 07/16/23 1012  BNP 428.0*    DDimer  Recent Labs  Lab 07/16/23 1012  DDIMER 0.28     Radiology  ECHOCARDIOGRAM COMPLETE  Result Date: 07/17/2023    ECHOCARDIOGRAM REPORT   Patient Name:   Joanne Harris Date of Exam: 07/17/2023 Medical Rec #:  [id number]           Height:       63.5 in Accession #:    [id number]          Weight:       320.0 lb Date of Birth:  1980/12/06          BSA:          2.375 m Patient Age:    41 years            BP:           125/94 mmHg Patient Gender: F                   HR:           84 bpm. Exam Location:  Inpatient Procedure: 2D Echo, Cardiac Doppler and Color Doppler Indications:    Ventricular Tachycardia I47.2  History:        Patient has prior history of Echocardiogram examinations, most                 recent 04/16/2020. Arrythmias:Tachycardia; Risk Factors:Diabetes                 and Non-Smoker.  Sonographer:    Aron Baba Referring Phys: Sharla Kidney [facility]  Sonographer Comments: Patient is obese and Technically difficult study due to poor echo windows. IMPRESSIONS  1. Limited echo windows- Definity contrast was not administered. Left ventricular ejection fraction, by estimation, is 60 to 65%. The left ventricle has normal function. The left ventricle has no regional wall motion abnormalities. There is moderate left ventricular hypertrophy. Left ventricular diastolic parameters are consistent with Grade I diastolic dysfunction (impaired relaxation).  2. Poorly visualized, appears underfilled. RA and RV collapse noted. RVEF is reduced. Right ventricular systolic function is moderately reduced. The right ventricular size is small.  There is normal pulmonary artery systolic pressure. The estimated right  ventricular systolic pressure is 11.7 mmHg.  3. Cannot rule-out tamponade physiology. Large pericardial  effusion. The pericardial effusion is circumferential.  4. The mitral valve is grossly normal. Trivial mitral valve regurgitation.  5. The aortic valve was not well visualized. Aortic valve regurgitation is not visualized. No aortic stenosis is present.  6. IVC is poorly visualized, but does not appear dilated. Comparison(s): Changes from prior study are noted. 04/16/2020: LVEF 65-70%, moderate LVH, normal RV function, trivial pericardial effusion. Conclusion(s)/Recommendation(s): Large pericardial effusion - cannot exclude tamponade physiology. See clinical evalutions and recommendations in my progress note. FINDINGS  Left Ventricle: Limited echo windows- Definity contrast was not administered. Left ventricular ejection fraction, by estimation, is 60 to 65%. The left ventricle has normal function. The left ventricle has no regional wall motion abnormalities. The left  ventricular internal cavity size was normal in size. There is moderate left ventricular hypertrophy. Left ventricular diastolic parameters are consistent with Grade I diastolic dysfunction (impaired relaxation). Indeterminate filling pressures. Right Ventricle: Poorly visualized, appears underfilled. RA and RV collapse noted. RVEF is reduced. The right ventricular size is small. Right vetricular wall thickness was not well visualized. Right ventricular systolic function is moderately reduced. There is normal pulmonary artery systolic pressure. The tricuspid regurgitant velocity is 0.96 m/s, and with an assumed right atrial pressure of 8 mmHg, the estimated right ventricular systolic pressure is 11.7 mmHg. Left Atrium: Left atrial size was normal in size. Right Atrium: Right atrial size was normal in size. Pericardium: Cannot rule-out tamponade physiology. A large pericardial effusion is present. The pericardial effusion is circumferential. There is diastolic collapse of the right ventricular free wall and diastolic collapse of the right atrial wall.  Mitral Valve: The mitral valve is grossly normal. Trivial mitral valve regurgitation. Tricuspid Valve: The tricuspid valve is not well visualized. Tricuspid valve regurgitation is trivial. Aortic Valve: The aortic valve was not well visualized. Aortic valve regurgitation is not visualized. No aortic stenosis is present. Pulmonic Valve: The pulmonic valve was not well visualized. Pulmonic valve regurgitation is not visualized. Aorta: The aortic root and ascending aorta are structurally normal, with no evidence of dilitation. Venous: IVC is poorly visualized, but does not appear dilated. The inferior vena cava was not well visualized. IAS/Shunts: The interatrial septum was not well visualized.  LEFT VENTRICLE PLAX 2D LVIDd:         3.90 cm   Diastology LVIDs:         2.90 cm   LV e' medial:    5.98 cm/s LV PW:         1.10 cm   LV E/e' medial:  15.1 LV IVS:        0.80 cm   LV e' lateral:   7.62 cm/s LVOT diam:     1.80 cm   LV E/e' lateral: 11.9 LV SV:         54 LV SV Index:   23 LVOT Area:     2.54 cm  RIGHT VENTRICLE RV S prime:     4.57 cm/s TAPSE (M-mode): 1.3 cm LEFT ATRIUM             Index        RIGHT ATRIUM           Index LA diam:        3.20 cm 1.35 cm/m   RA Area:     24.10 cm LA Vol (A2C):  30.8 ml 12.97 ml/m  RA Volume:   77.50 ml  32.63 ml/m LA Vol (A4C):   74.9 ml 31.54 ml/m LA Biplane Vol: 51.8 ml 21.81 ml/m  AORTIC VALVE LVOT Vmax:   121.00 cm/s LVOT Vmean:  79.300 cm/s LVOT VTI:    0.213 m  AORTA Ao Root diam: 3.20 cm Ao Asc diam:  3.20 cm MITRAL VALVE               TRICUSPID VALVE MV Area (PHT): 4.41 cm    TR Peak grad:   3.7 mmHg MV Decel Time: 172 msec    TR Vmax:        95.60 cm/s MR Peak grad: 3.9 mmHg MR Vmax:      99.10 cm/s   SHUNTS MV E velocity: 90.40 cm/s  Systemic VTI:  0.21 m MV A velocity: 85.30 cm/s  Systemic Diam: 1.80 cm MV E/A ratio:  1.06 Joanne Shutter MD Electronically signed by Joanne Shutter MD Signature Date/Time: 07/17/2023/11:05:48 AM    Final     Cardiac Studies   TTE 07/17/2023  1. Limited echo windows- Definity contrast was not administered. Left  ventricular ejection fraction, by estimation, is 60 to 65%. The left  ventricle has normal function. The left ventricle has no regional wall  motion abnormalities. There is moderate  left ventricular hypertrophy. Left ventricular diastolic parameters are  consistent with Grade I diastolic dysfunction (impaired relaxation).   2. Poorly visualized, appears underfilled. RA and RV collapse noted. RVEF  is reduced. Right ventricular systolic function is moderately reduced. The  right ventricular size is small. There is normal pulmonary artery systolic  pressure. The estimated right   ventricular systolic pressure is 11.7 mmHg.   3. Cannot rule-out tamponade physiology. Large pericardial effusion. The  pericardial effusion is circumferential.   4. The mitral valve is grossly normal. Trivial mitral valve  regurgitation.   5. The aortic valve was not well visualized. Aortic valve regurgitation  is not visualized. No aortic stenosis is present.   6. IVC is poorly visualized, but does not appear dilated.   Patient Profile  Joanne Harris is a 42 y.o. female with 50-year-old female with history of hypothyroidism related to prior thyroid ablation, obesity, diabetes who was admitted on 07/16/2023 with ventricular tachycardia.  Course complicated by moderate to large pericardial effusion.  Assessment & Plan   # Ventricular tachycardia -Unclear if this is related to severe profound hypothyroidism.  We will have EP see her. -We will proceed with left and right heart catheterization today.  She does have a moderate to large pericardial effusion but no clinical evidence of tamponade.  We will exclude CAD as well as hemodynamically study to determine if this is significant.  I believe this is not.  I suspect this will get better with treatment of her thyroid disease. -Plan for cardiac MRI tomorrow. -In the meantime I  have added metoprolol succinate 25 mg daily -EP will evaluate her as well.  Informed Consent   Shared Decision Making/Informed Consent The risks [stroke (1 in 1000), death (1 in 1000), kidney failure [usually temporary] (1 in 500), bleeding (1 in 200), allergic reaction [possibly serious] (1 in 200)], benefits (diagnostic support and management of coronary artery disease) and alternatives of a cardiac catheterization were discussed in detail with Joanne Harris and she is willing to proceed.      # Moderate to large pericardial effusion -No evidence of tamponade.  No good windows for percutaneous intervention.  Left  and right heart catheterization today to determine hemodynamic significance. -Suspect this is all related to thyroid dysfunction.  Likely will just get better.  Continue to treat thyroid.  # Severe hypothyroidism -Per hospital medicine team.  # Diabetes # Obesity -Poorly controlled.  She is working on this.      For questions or updates, please contact [facility] Please consult [email or web address] for contact info under        Signed, Gerri Spore T. Flora Lipps, MD, [facility] [place]  [facility]  07/19/2023 8:18 AM

## 2023-07-19 NOTE — Progress Notes (Signed)
Patient back to room from cath lab. Both cath sites stable, level 0. VS stable and family at bedside.

## 2023-07-19 NOTE — TOC CM/SW Note (Signed)
Transition of Care [facility]) - Inpatient Brief Assessment   Patient Details  Name: Joanne Harris MRN: [id number] Date of Birth: [birth year]  Transition of Care [facility]) CM/SW Contact:    Gala Lewandowsky, RN Phone Number: 07/19/2023, 3:58 PM   Clinical Narrative: Patient presented for shortness of breath. Patient has insurance and PCP. Case Manager will be unable to assist with co pays for medication assistance. Case Manager will continue to follow for additional transition of care needs.    Transition of Care Asessment: Insurance and Status: Insurance coverage has been reviewed Patient has primary care physician: Yes Prior/Current Home Services: No current home services Social Determinants of Health Reivew: SDOH reviewed no interventions necessary Readmission risk has been reviewed: Yes Transition of care needs: no transition of care needs at this time

## 2023-07-19 NOTE — Consult Note (Signed)
ELECTROPHYSIOLOGY CONSULT NOTE    Patient ID: Joanne Harris MRN: [id number], DOB/AGE: 1981/04/22 41 y.o.  Admit date: 07/16/2023 Date of Consult: 07/19/2023  Primary Physician: [name], MD Primary Cardiologist: None  Electrophysiologist: New   Referring Provider: Dr. Flora Lipps  Patient Profile: Joanne Harris is a 42 y.o. female with a history of hypothyroidism related to prior thyroid ablation, obesity, DM2 who is being seen today for the evaluation of WCT at the request of Dr. Flora Lipps.  HPI:  Joanne Harris is a 42 y.o. female with medical history as above.  [name] is a 42 yo female with history of DM, hypothyroidism and obesity who presented to the [facility] ED with c/w dyspnea and upper back pain for 24 hours. She reported not taking Synthroid for months until yesterday. Echo in 2021 with normal LV function, moderate LVH. When she presented to the ED she was found to have a wide complex tachycardia. She was cardioverted to sinus rhythm. She was found to have TSH of 121 and T4 less than 0.25. BNP 428. She is now being transferred to [facility] to the Medicine service for further evaluation of her profound hypothyroidism.   EP is consulted for further evaluation and recommendations of her WCT.   Currently, she is feeling much better on supportive care.  Has had intermittent heart racing but not as severe or persistent as the episodes that led to admission. Mild chest discomfort in the setting of tachy-palpitations on admission, otherwise no exertional chest pain.  No syncope.  Labs Potassium3.6 (09/21 0431) Magnesium  2.0 (09/20 1012) Creatinine, ser  0.95 (09/21 0431) PLT  202 (09/21 0431) HGB  10.4* (09/21 0431) WBC 9.5 (09/21 0431) Troponin I (High Sensitivity)60* (09/20 2026).    Allergies, Medical, Surgical, Social, and Family Histories have been reviewed and are referenced here-in when relevant for medical decision making.    Physical  Exam: Vitals:   07/18/23 1953 07/19/23 0419 07/19/23 0549 07/19/23 0837  BP: 133/77 (!) 133/93  131/84  Pulse: 86 79  80  Resp: 18 18  18   Temp: 98.8 F (37.1 C) 97.8 F (36.6 C)  98.6 F (37 C)  TempSrc: Oral Oral  Oral  SpO2:  93%  94%  Weight:   (!) 144.7 kg   Height:        GEN- NAD, A&O x 3, normal affect HEENT: Normocephalic, atraumatic Lungs- CTAB, Normal effort.  Heart- Regular rate and rhythm, No M/G/R.  GI- Soft, NT, ND.  Extremities- No clubbing, cyanosis, or edema   Radiology/Studies:  Echo 07/17/2023 LVEF 60-65%, Grade 1 DD, Moderate to large pericardial effusion   EKG:on arrival showed WCT at 151 bpm (personally reviewed)      Follow up EKG s/p [facility] showed Sinus tach at 100 bpm with PR interval 172 ms QRS 80 ms and QT ~517  TELEMETRY: NSR  70-80s (personally reviewed)  Assessment/Plan:  Ventricular tachycardia Likely related to her severe, profound hypothyroidism Planning L/RHC AAD options limited given non-compliance ? If cMRI would help elucidate or cardiomyopathy (especially if cath is unremarkable) Keep K > 4.0 and Mg > 2.0 - Labs pending this am. Pending course, would at least plan Zio on discharge.  Not likely to need Lifevest given hemodynamically stable nature of her WCT on arrival.    Moderate to large pericardial effusion No evidence of tamponade  No good windows for percutaneous intervention L/RHC today.  Likely 2/2 severe hypothyroidism  Severe hypthyroidism S/p prior  thyroid ablation Has had chronic and intermittent issues with compliance Has been off meds for at least 3-4 months TSH 121 on arrival,  T3/T4 both undetectable  Obesity Body mass index is 55.62 kg/m.    For questions or updates, please contact [facility] Please consult [email or web address] for contact info under Cardiology/STEMI.  Dustin Flock, PA-C  07/19/2023 9:03 AM

## 2023-07-19 NOTE — H&P (View-Only) (Signed)
Cardiology Progress Note  Patient ID: Joanne Harris MRN: [id number] DOB: 11/19/1980 Date of Encounter: 07/19/2023  Primary Cardiologist: None  Subjective   Chief Complaint: None.   HPI: Telemetry unremarkable.  Denies chest pain or trouble breathing.  Reviewed echo with interventional cardiology.  No good window for pericardiocentesis.  Will proceed with left and right heart catheterization today.  She will need cardiac MRI and EP evaluation.  ROS:  All other ROS reviewed and negative. Pertinent positives noted in the HPI.     Inpatient Medications  Scheduled Meds:  chlorhexidine  60 mL Topical Once   enoxaparin (LOVENOX) injection  70 mg Subcutaneous Q24H   insulin aspart  0-9 Units Subcutaneous TID WC   levothyroxine  50 mcg Oral Q0600   metoprolol succinate  25 mg Oral Daily   sodium chloride flush  3 mL Intravenous Q12H   Continuous Infusions:  sodium chloride     sodium chloride     PRN Meds: sodium chloride, acetaminophen **OR** acetaminophen, hydrALAZINE, levalbuterol, loperamide, melatonin, polyethylene glycol, sodium chloride flush   Vital Signs   Vitals:   07/18/23 1243 07/18/23 1953 07/19/23 0419 07/19/23 0549  BP: 104/82 133/77 (!) 133/93   Pulse: 80 86 79   Resp: 16 18 18    Temp: 98.8 F (37.1 C) 98.8 F (37.1 C) 97.8 F (36.6 C)   TempSrc: Oral Oral Oral   SpO2: 95%  93%   Weight:    (!) 144.7 kg  Height:        Intake/Output Summary (Last 24 hours) at 07/19/2023 0818 Last data filed at 07/18/2023 2200 Gross per 24 hour  Intake 240 ml  Output --  Net 240 ml      07/19/2023    5:49 AM 07/16/2023    4:23 PM 07/16/2023    9:55 AM  Last 3 Weights  Weight (lbs) 319 lb 320 lb 320 lb  Weight (kg) 144.697 kg 145.151 kg 145.151 kg      Telemetry  Overnight telemetry shows sinus rhythm 80s, which I personally reviewed.   ECG  The most recent ECG shows sinus rhythm heart rate 81, low voltage, which I personally reviewed.   Physical Exam    Vitals:   07/18/23 1243 07/18/23 1953 07/19/23 0419 07/19/23 0549  BP: 104/82 133/77 (!) 133/93   Pulse: 80 86 79   Resp: 16 18 18    Temp: 98.8 F (37.1 C) 98.8 F (37.1 C) 97.8 F (36.6 C)   TempSrc: Oral Oral Oral   SpO2: 95%  93%   Weight:    (!) 144.7 kg  Height:        Intake/Output Summary (Last 24 hours) at 07/19/2023 0818 Last data filed at 07/18/2023 2200 Gross per 24 hour  Intake 240 ml  Output --  Net 240 ml       07/19/2023    5:49 AM 07/16/2023    4:23 PM 07/16/2023    9:55 AM  Last 3 Weights  Weight (lbs) 319 lb 320 lb 320 lb  Weight (kg) 144.697 kg 145.151 kg 145.151 kg    Body mass index is 55.62 kg/m.  General: Well nourished, well developed, in no acute distress Head: Atraumatic, normal size  Eyes: PEERLA, EOMI  Neck: Supple, no JVD Endocrine: No thryomegaly Cardiac: Normal S1, S2; RRR; no murmurs, rubs, or gallops Lungs: Clear to auscultation bilaterally, no wheezing, rhonchi or rales  Abd: Soft, nontender, no hepatomegaly  Ext: No edema, pulses 2+ Musculoskeletal: No deformities, BUE  and BLE strength normal and equal Skin: Warm and dry, no rashes   Neuro: Alert and oriented to person, place, time, and situation, CNII-XII grossly intact, no focal deficits  Psych: Normal mood and affect   Labs  High Sensitivity Troponin:   Recent Labs  Lab 07/16/23 1802 07/16/23 2026  TROPONINIHS 61* 60*     Cardiac EnzymesNo results for input(s): "TROPONINI" in the last 168 hours. No results for input(s): "TROPIPOC" in the last 168 hours.  Chemistry Recent Labs  Lab 07/16/23 1012 07/16/23 1802 07/17/23 0431  NA 135  --  134*  K 3.5  --  3.6  CL 101  --  105  CO2 19*  --  20*  GLUCOSE 248*  --  155*  BUN 15  --  13  CREATININE 1.10* 0.96 0.95  CALCIUM 9.0  --  8.8*  GFRNONAA >60 >60 >60  ANIONGAP 15  --  9    Hematology Recent Labs  Lab 07/16/23 1012 07/16/23 1802 07/17/23 0431  WBC 12.9* 10.4 9.5  RBC 3.92 3.72* 3.18*  HGB 12.9 12.1  10.4*  HCT 38.0 36.4 31.3*  MCV 96.9 97.8 98.4  MCH 32.9 32.5 32.7  MCHC 33.9 33.2 33.2  RDW 13.2 13.2 13.3  PLT 395 373 202   BNP Recent Labs  Lab 07/16/23 1012  BNP 428.0*    DDimer  Recent Labs  Lab 07/16/23 1012  DDIMER 0.28     Radiology  ECHOCARDIOGRAM COMPLETE  Result Date: 07/17/2023    ECHOCARDIOGRAM REPORT   Patient Name:   Joanne Harris Date of Exam: 07/17/2023 Medical Rec #:  [id number]           Height:       63.5 in Accession #:    [id number]          Weight:       320.0 lb Date of Birth:  1980/12/06          BSA:          2.375 m Patient Age:    41 years            BP:           125/94 mmHg Patient Gender: F                   HR:           84 bpm. Exam Location:  Inpatient Procedure: 2D Echo, Cardiac Doppler and Color Doppler Indications:    Ventricular Tachycardia I47.2  History:        Patient has prior history of Echocardiogram examinations, most                 recent 04/16/2020. Arrythmias:Tachycardia; Risk Factors:Diabetes                 and Non-Smoker.  Sonographer:    Aron Baba Referring Phys: Sharla Kidney [facility]  Sonographer Comments: Patient is obese and Technically difficult study due to poor echo windows. IMPRESSIONS  1. Limited echo windows- Definity contrast was not administered. Left ventricular ejection fraction, by estimation, is 60 to 65%. The left ventricle has normal function. The left ventricle has no regional wall motion abnormalities. There is moderate left ventricular hypertrophy. Left ventricular diastolic parameters are consistent with Grade I diastolic dysfunction (impaired relaxation).  2. Poorly visualized, appears underfilled. RA and RV collapse noted. RVEF is reduced. Right ventricular systolic function is moderately reduced. The right ventricular size is small.  There is normal pulmonary artery systolic pressure. The estimated right  ventricular systolic pressure is 11.7 mmHg.  3. Cannot rule-out tamponade physiology. Large pericardial  effusion. The pericardial effusion is circumferential.  4. The mitral valve is grossly normal. Trivial mitral valve regurgitation.  5. The aortic valve was not well visualized. Aortic valve regurgitation is not visualized. No aortic stenosis is present.  6. IVC is poorly visualized, but does not appear dilated. Comparison(s): Changes from prior study are noted. 04/16/2020: LVEF 65-70%, moderate LVH, normal RV function, trivial pericardial effusion. Conclusion(s)/Recommendation(s): Large pericardial effusion - cannot exclude tamponade physiology. See clinical evalutions and recommendations in my progress note. FINDINGS  Left Ventricle: Limited echo windows- Definity contrast was not administered. Left ventricular ejection fraction, by estimation, is 60 to 65%. The left ventricle has normal function. The left ventricle has no regional wall motion abnormalities. The left  ventricular internal cavity size was normal in size. There is moderate left ventricular hypertrophy. Left ventricular diastolic parameters are consistent with Grade I diastolic dysfunction (impaired relaxation). Indeterminate filling pressures. Right Ventricle: Poorly visualized, appears underfilled. RA and RV collapse noted. RVEF is reduced. The right ventricular size is small. Right vetricular wall thickness was not well visualized. Right ventricular systolic function is moderately reduced. There is normal pulmonary artery systolic pressure. The tricuspid regurgitant velocity is 0.96 m/s, and with an assumed right atrial pressure of 8 mmHg, the estimated right ventricular systolic pressure is 11.7 mmHg. Left Atrium: Left atrial size was normal in size. Right Atrium: Right atrial size was normal in size. Pericardium: Cannot rule-out tamponade physiology. A large pericardial effusion is present. The pericardial effusion is circumferential. There is diastolic collapse of the right ventricular free wall and diastolic collapse of the right atrial wall.  Mitral Valve: The mitral valve is grossly normal. Trivial mitral valve regurgitation. Tricuspid Valve: The tricuspid valve is not well visualized. Tricuspid valve regurgitation is trivial. Aortic Valve: The aortic valve was not well visualized. Aortic valve regurgitation is not visualized. No aortic stenosis is present. Pulmonic Valve: The pulmonic valve was not well visualized. Pulmonic valve regurgitation is not visualized. Aorta: The aortic root and ascending aorta are structurally normal, with no evidence of dilitation. Venous: IVC is poorly visualized, but does not appear dilated. The inferior vena cava was not well visualized. IAS/Shunts: The interatrial septum was not well visualized.  LEFT VENTRICLE PLAX 2D LVIDd:         3.90 cm   Diastology LVIDs:         2.90 cm   LV e' medial:    5.98 cm/s LV PW:         1.10 cm   LV E/e' medial:  15.1 LV IVS:        0.80 cm   LV e' lateral:   7.62 cm/s LVOT diam:     1.80 cm   LV E/e' lateral: 11.9 LV SV:         54 LV SV Index:   23 LVOT Area:     2.54 cm  RIGHT VENTRICLE RV S prime:     4.57 cm/s TAPSE (M-mode): 1.3 cm LEFT ATRIUM             Index        RIGHT ATRIUM           Index LA diam:        3.20 cm 1.35 cm/m   RA Area:     24.10 cm LA Vol (A2C):  30.8 ml 12.97 ml/m  RA Volume:   77.50 ml  32.63 ml/m LA Vol (A4C):   74.9 ml 31.54 ml/m LA Biplane Vol: 51.8 ml 21.81 ml/m  AORTIC VALVE LVOT Vmax:   121.00 cm/s LVOT Vmean:  79.300 cm/s LVOT VTI:    0.213 m  AORTA Ao Root diam: 3.20 cm Ao Asc diam:  3.20 cm MITRAL VALVE               TRICUSPID VALVE MV Area (PHT): 4.41 cm    TR Peak grad:   3.7 mmHg MV Decel Time: 172 msec    TR Vmax:        95.60 cm/s MR Peak grad: 3.9 mmHg MR Vmax:      99.10 cm/s   SHUNTS MV E velocity: 90.40 cm/s  Systemic VTI:  0.21 m MV A velocity: 85.30 cm/s  Systemic Diam: 1.80 cm MV E/A ratio:  1.06 Joanne Shutter MD Electronically signed by Joanne Shutter MD Signature Date/Time: 07/17/2023/11:05:48 AM    Final     Cardiac Studies   TTE 07/17/2023  1. Limited echo windows- Definity contrast was not administered. Left  ventricular ejection fraction, by estimation, is 60 to 65%. The left  ventricle has normal function. The left ventricle has no regional wall  motion abnormalities. There is moderate  left ventricular hypertrophy. Left ventricular diastolic parameters are  consistent with Grade I diastolic dysfunction (impaired relaxation).   2. Poorly visualized, appears underfilled. RA and RV collapse noted. RVEF  is reduced. Right ventricular systolic function is moderately reduced. The  right ventricular size is small. There is normal pulmonary artery systolic  pressure. The estimated right   ventricular systolic pressure is 11.7 mmHg.   3. Cannot rule-out tamponade physiology. Large pericardial effusion. The  pericardial effusion is circumferential.   4. The mitral valve is grossly normal. Trivial mitral valve  regurgitation.   5. The aortic valve was not well visualized. Aortic valve regurgitation  is not visualized. No aortic stenosis is present.   6. IVC is poorly visualized, but does not appear dilated.   Patient Profile  Joanne Harris is a 42 y.o. female with 50-year-old female with history of hypothyroidism related to prior thyroid ablation, obesity, diabetes who was admitted on 07/16/2023 with ventricular tachycardia.  Course complicated by moderate to large pericardial effusion.  Assessment & Plan   # Ventricular tachycardia -Unclear if this is related to severe profound hypothyroidism.  We will have EP see her. -We will proceed with left and right heart catheterization today.  She does have a moderate to large pericardial effusion but no clinical evidence of tamponade.  We will exclude CAD as well as hemodynamically study to determine if this is significant.  I believe this is not.  I suspect this will get better with treatment of her thyroid disease. -Plan for cardiac MRI tomorrow. -In the meantime I  have added metoprolol succinate 25 mg daily -EP will evaluate her as well.  Informed Consent   Shared Decision Making/Informed Consent The risks [stroke (1 in 1000), death (1 in 1000), kidney failure [usually temporary] (1 in 500), bleeding (1 in 200), allergic reaction [possibly serious] (1 in 200)], benefits (diagnostic support and management of coronary artery disease) and alternatives of a cardiac catheterization were discussed in detail with Joanne Harris and she is willing to proceed.      # Moderate to large pericardial effusion -No evidence of tamponade.  No good windows for percutaneous intervention.  Left  and right heart catheterization today to determine hemodynamic significance. -Suspect this is all related to thyroid dysfunction.  Likely will just get better.  Continue to treat thyroid.  # Severe hypothyroidism -Per hospital medicine team.  # Diabetes # Obesity -Poorly controlled.  She is working on this.      For questions or updates, please contact [facility] Please consult [email or web address] for contact info under        Signed, Gerri Spore T. Flora Lipps, MD, [facility] [place]  [facility]  07/19/2023 8:18 AM

## 2023-07-19 NOTE — Interval H&P Note (Signed)
History and Physical Interval Note:  07/19/2023 4:56 PM  Joanne Harris  has presented today for surgery, with the diagnosis of pericardial effusion and ventricular tachycardia.  The various methods of treatment have been discussed with the patient and family. After consideration of risks, benefits and other options for treatment, the patient has consented to  Procedure(s): RIGHT/LEFT HEART CATH AND CORONARY ANGIOGRAPHY (N/A) as a surgical intervention.  The patient's history has been reviewed, patient examined, no change in status, stable for surgery.  I have reviewed the patient's chart and labs.  Questions were answered to the patient's satisfaction.    Cath Lab Visit (complete for each Cath Lab visit)  Clinical Evaluation Leading to the Procedure:   ACS: Yes.   (VT)  Non-ACS:  N/A  Minyon Billiter

## 2023-07-19 NOTE — Plan of Care (Signed)

## 2023-07-20 ENCOUNTER — Encounter (HOSPITAL_COMMUNITY): Payer: Self-pay | Admitting: Internal Medicine

## 2023-07-20 ENCOUNTER — Inpatient Hospital Stay (HOSPITAL_COMMUNITY): Payer: 59

## 2023-07-20 DIAGNOSIS — I472 Ventricular tachycardia, unspecified: Secondary | ICD-10-CM | POA: Diagnosis not present

## 2023-07-20 DIAGNOSIS — I3139 Other pericardial effusion (noninflammatory): Secondary | ICD-10-CM

## 2023-07-20 LAB — BASIC METABOLIC PANEL
Anion gap: 12 (ref 5–15)
BUN: 8 mg/dL (ref 6–20)
CO2: 22 mmol/L (ref 22–32)
Calcium: 8.9 mg/dL (ref 8.9–10.3)
Chloride: 100 mmol/L (ref 98–111)
Creatinine, Ser: 0.97 mg/dL (ref 0.44–1.00)
GFR, Estimated: >60 mL/min (ref >60)
Glucose, Bld: 135 mg/dL — ABNORMAL HIGH (ref 70–99)
Potassium: 3.5 mmol/L (ref 3.5–5.1)
Sodium: 134 mmol/L — ABNORMAL LOW (ref 135–145)

## 2023-07-20 LAB — GLUCOSE, CAPILLARY
Glucose-Capillary: 140 mg/dL — ABNORMAL HIGH (ref 70–99)
Glucose-Capillary: 143 mg/dL — ABNORMAL HIGH (ref 70–99)
Glucose-Capillary: 125 mg/dL — ABNORMAL HIGH (ref 70–99)
Glucose-Capillary: 137 mg/dL — ABNORMAL HIGH (ref 70–99)

## 2023-07-20 LAB — MAGNESIUM: Magnesium: 2 mg/dL (ref 1.7–2.4)

## 2023-07-20 MED ORDER — LORAZEPAM 0.5 MG PO TABS: 0.5000 mg | ORAL_TABLET | Freq: Once | ORAL | Status: DC

## 2023-07-20 MED ORDER — POTASSIUM CHLORIDE CRYS ER 20 MEQ PO TBCR
40.0000 meq | EXTENDED_RELEASE_TABLET | ORAL | Status: AC
  Administered 2023-07-20 (×2): 40 meq via ORAL
  Filled 2023-07-20 (×2): qty 2

## 2023-07-20 MED ORDER — SPIRONOLACTONE 25 MG PO TABS
25.0000 mg | ORAL_TABLET | Freq: Every day | ORAL | Status: DC
  Administered 2023-07-20 – 2023-07-21 (×2): 25 mg via ORAL
  Filled 2023-07-20 (×2): qty 1

## 2023-07-20 MED ORDER — GADOBUTROL 1 MMOL/ML IV SOLN
10.0000 mL | Freq: Once | INTRAVENOUS | Status: AC | PRN
  Administered 2023-07-20: 10 mL via INTRAVENOUS

## 2023-07-20 MED ORDER — LORAZEPAM 2 MG/ML IJ SOLN
1.0000 mg | Freq: Once | INTRAMUSCULAR | Status: AC
  Administered 2023-07-20: 1 mg via INTRAVENOUS
  Filled 2023-07-20: qty 1

## 2023-07-20 MED FILL — Verapamil HCl IV Soln 2.5 MG/ML: INTRAVENOUS | Qty: 2 | Status: AC

## 2023-07-20 NOTE — Progress Notes (Signed)
Cardiology Progress Note  Patient ID: Joanne Harris MRN: [id number] DOB: 1981/09/07 Date of Encounter: 07/20/2023  Primary Cardiologist: None  Subjective   Chief Complaint: Orthopnea   HPI: Nonobstructive CAD.  Left and right heart catheterization with no definitive evidence of tamponade.  Blood pressure stable.  Elevated filling pressures.  We discussed diuresis.  Feeling better.  Denies chest pain.  No arrhythmias on telemetry.  ROS:  All other ROS reviewed and negative. Pertinent positives noted in the HPI.     Inpatient Medications  Scheduled Meds:  enoxaparin (LOVENOX) injection  70 mg Subcutaneous Q24H   insulin aspart  0-9 Units Subcutaneous TID WC   levothyroxine  50 mcg Oral Q0600   metoprolol succinate  25 mg Oral Daily   sodium chloride flush  3 mL Intravenous Q12H   sodium chloride flush  3 mL Intravenous Q12H   spironolactone  25 mg Oral Daily   Continuous Infusions:  sodium chloride     sodium chloride Stopped (07/19/23 1632)   sodium chloride     PRN Meds: sodium chloride, sodium chloride, acetaminophen **OR** acetaminophen, hydrALAZINE, levalbuterol, loperamide, melatonin, polyethylene glycol, sodium chloride flush, sodium chloride flush   Vital Signs   Vitals:   07/19/23 1830 07/19/23 1909 07/19/23 2100 07/20/23 0918  BP:  (!) 152/113 (!) 133/95 (!) 118/91  Pulse: (!) 0   77  Resp:   18 18  Temp:   98.5 F (36.9 C) 98.1 F (36.7 C)  TempSrc:   Oral Oral  SpO2:    99%  Weight:      Height:        Intake/Output Summary (Last 24 hours) at 07/20/2023 0955 Last data filed at 07/19/2023 2030 Gross per 24 hour  Intake 50.67 ml  Output --  Net 50.67 ml      07/19/2023    5:49 AM 07/16/2023    4:23 PM 07/16/2023    9:55 AM  Last 3 Weights  Weight (lbs) 319 lb 320 lb 320 lb  Weight (kg) 144.697 kg 145.151 kg 145.151 kg      Telemetry  Overnight telemetry shows SR 70s, which I personally reviewed.    Physical Exam   Vitals:   07/19/23  1830 07/19/23 1909 07/19/23 2100 07/20/23 0918  BP:  (!) 152/113 (!) 133/95 (!) 118/91  Pulse: (!) 0   77  Resp:   18 18  Temp:   98.5 F (36.9 C) 98.1 F (36.7 C)  TempSrc:   Oral Oral  SpO2:    99%  Weight:      Height:        Intake/Output Summary (Last 24 hours) at 07/20/2023 0955 Last data filed at 07/19/2023 2030 Gross per 24 hour  Intake 50.67 ml  Output --  Net 50.67 ml       07/19/2023    5:49 AM 07/16/2023    4:23 PM 07/16/2023    9:55 AM  Last 3 Weights  Weight (lbs) 319 lb 320 lb 320 lb  Weight (kg) 144.697 kg 145.151 kg 145.151 kg    Body mass index is 55.62 kg/m.  General: Well nourished, well developed, in no acute distress Head: Atraumatic, normal size  Eyes: PEERLA, EOMI  Neck: Supple, no JVD Endocrine: No thryomegaly Cardiac: Normal S1, S2; RRR; no murmurs, rubs, or gallops Lungs: Clear to auscultation bilaterally, no wheezing, rhonchi or rales  Abd: Soft, nontender, no hepatomegaly  Ext: No edema, pulses 2+, right femoral cath 2+ pulse Musculoskeletal: No deformities,  BUE and BLE strength normal and equal Skin: Warm and dry, no rashes   Neuro: Alert and oriented to person, place, time, and situation, CNII-XII grossly intact, no focal deficits  Psych: Normal mood and affect   Labs  High Sensitivity Troponin:   Recent Labs  Lab 07/16/23 1802 07/16/23 2026  TROPONINIHS 61* 60*     Cardiac EnzymesNo results for input(s): "TROPONINI" in the last 168 hours. No results for input(s): "TROPIPOC" in the last 168 hours.  Chemistry Recent Labs  Lab 07/17/23 0431 07/19/23 1321 07/19/23 1715 07/19/23 1716 07/19/23 1730 07/20/23 0342  NA 134* 135   < > 138 137 134*  K 3.6 3.2*   < > 3.5 3.5 3.5  CL 105 101  --   --   --  100  CO2 20* 26  --   --   --  22  GLUCOSE 155* 123*  --   --   --  135*  BUN 13 8  --   --   --  8  CREATININE 0.95 1.03*  --   --   --  0.97  CALCIUM 8.8* 9.1  --   --   --  8.9  GFRNONAA >60 >60  --   --   --  >60  ANIONGAP 9 8   --   --   --  12   < > = values in this interval not displayed.    Hematology Recent Labs  Lab 07/16/23 1012 07/16/23 1802 07/17/23 0431 07/19/23 1715 07/19/23 1716 07/19/23 1730  WBC 12.9* 10.4 9.5  --   --   --   RBC 3.92 3.72* 3.18*  --   --   --   HGB 12.9 12.1 10.4* 11.6* 11.2* 10.9*  HCT 38.0 36.4 31.3* 34.0* 33.0* 32.0*  MCV 96.9 97.8 98.4  --   --   --   MCH 32.9 32.5 32.7  --   --   --   MCHC 33.9 33.2 33.2  --   --   --   RDW 13.2 13.2 13.3  --   --   --   PLT 395 373 202  --   --   --    BNP Recent Labs  Lab 07/16/23 1012  BNP 428.0*    DDimer  Recent Labs  Lab 07/16/23 1012  DDIMER 0.28     Radiology  CARDIAC CATHETERIZATION  Result Date: 07/19/2023 Conclusions: No angiographically significant coronary artery disease. Mildly to moderately elevated left heart and pulmonary artery pressures. Severely elevated right heart filling pressures. Normal Fick cardiac output/index. Hemodynamics with equivocal findings of cardiac tamponade (absent y-descent noted on RA pressure tracing, elevated right heart pressures not completely equalized with left heart pressures, and concordant LV/RV pressures). Recommendations: Proceed with cardiac MRI. Favor deferring drainage of pericardial effusion at this time, given lack of symptoms and equivocal hemodynamic findings of tamponade physiology. Primary prevention of coronary artery disease. Yvonne Kendall, MD [facility]   Cardiac Studies  TTE 07/17/2023  1. Limited echo windows- Definity contrast was not administered. Left  ventricular ejection fraction, by estimation, is 60 to 65%. The left  ventricle has normal function. The left ventricle has no regional wall  motion abnormalities. There is moderate  left ventricular hypertrophy. Left ventricular diastolic parameters are  consistent with Grade I diastolic dysfunction (impaired relaxation).   2. Poorly visualized, appears underfilled. RA and RV collapse noted. RVEF  is  reduced. Right ventricular systolic function is moderately  reduced. The  right ventricular size is small. There is normal pulmonary artery systolic  pressure. The estimated right   ventricular systolic pressure is 11.7 mmHg.   3. Cannot rule-out tamponade physiology. Large pericardial effusion. The  pericardial effusion is circumferential.   4. The mitral valve is grossly normal. Trivial mitral valve  regurgitation.   5. The aortic valve was not well visualized. Aortic valve regurgitation  is not visualized. No aortic stenosis is present.   6. IVC is poorly visualized, but does not appear dilated.   RHC/LHC 07/19/2023 Recommendations: Proceed with cardiac MRI. Favor deferring drainage of pericardial effusion at this time, given lack of symptoms and equivocal hemodynamic findings of tamponade physiology. Primary prevention of coronary artery disease.  Patient Profile  Muniba Cisney is a 42 y.o. female with 46-year-old female with history of hypothyroidism related to prior thyroid ablation, obesity, diabetes who was admitted on 07/16/2023 with ventricular tachycardia.  Course complicated by moderate to large pericardial effusion.   Assessment & Plan   # Ventricular tachycardia -Admitted with VT.  Status post cardioversion in the ER.  Also known to be severely hypothyroid with TSH around 120. -Left heart catheterization with no obstructive CAD. -She does have a large pericardial effusion.  No evidence of tamponade on her right left heart cath yesterday. -Cardiac MRI is pending. -Elevated filling pressures noted.  Giving Lasix today. -Follow-up results of cardiac MRI.  EP does not believe she will need an ICD.  Continue metoprolol succinate 25 mg daily.  # Moderate to large pericardial effusion # Elevated filling pressures -Blood pressure stable.  She is not tachycardic.  She has a large pericardial effusion but no evidence of tamponade on right and left heart catheter clinically.  I  favor treatment of her thyroid disorder and close follow-up of her effusion.  She also has a cardiac MRI which we can follow-up as well. -Lasix 20 mg IV as a one-time dose.  We will see how she does.  This could simply just be HFpEF in the setting of morbid obesity. -Will see how she does today.  She remained stable today, anticipate discharge tomorrow.  # Severe hypothyroidism -Per hospital medicine team.  # Diabetes/obesity -Poorly controlled diabetes.  She will need to work on this.    For questions or updates, please contact [facility] Please consult [email or web address] for contact info under   Signed, Gerri Spore T. Flora Lipps, MD, [facility] [place]  [facility]  07/20/2023 9:55 AM

## 2023-07-20 NOTE — Plan of Care (Signed)

## 2023-07-20 NOTE — Progress Notes (Addendum)
  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  At this time, the patient denies chest pain, shortness of breath, or any new concerns.  Vital Signs    Vitals:   07/19/23 1830 07/19/23 1909 07/19/23 2100 07/20/23 0918  BP:  (!) 152/113 (!) 133/95 (!) 118/91  Pulse: (!) 0   77  Resp:   18 18  Temp:   98.5 F (36.9 C) 98.1 F (36.7 C)  TempSrc:   Oral Oral  SpO2:    99%  Weight:      Height:        Intake/Output Summary (Last 24 hours) at 07/20/2023 1052 Last data filed at 07/19/2023 2030 Gross per 24 hour  Intake 50.67 ml  Output --  Net 50.67 ml   Filed Weights   07/16/23 0955 07/16/23 1623 07/19/23 0549  Weight: (!) 145.2 kg (!) 145.2 kg (!) 144.7 kg    Physical Exam    GEN- The patient is well appearing, alert and oriented x 3 today.   Lungs- Clear to ausculation bilaterally, normal work of breathing Cardiac- Regular rate and rhythm, no murmurs, rubs or gallops GI- soft, NT, ND, + BS Extremities- no clubbing or cyanosis. No edema  Telemetry    NSR 70s (personally reviewed)  Hospital Course    with a history of hypothyroidism related to prior thyroid ablation, obesity, DM2 who is being seen today for the evaluation of WCT at the request of Dr. Flora Harris.   Assessment & Plan    Ventricular tachycardia Likely related to her severe, profound hypothyroidism L/RHC without definite tamponade and no obstructive disease cMRI pending.  Potassium3.5 (09/24 0342) Magnesium  2.0 (09/24 0342) Creatinine, ser  0.97 (09/24 0342) Keep K > 4.0 and Mg > 2.0  Pending course, would at least plan Zio on discharge.  Not likely to need Lifevest given hemodynamically stable nature of her WCT on arrival.      Moderate to large pericardial effusion No evidence of tamponade by  RHC No good windows for percutaneous intervention Likely 2/2 severe hypothyroidism   Severe hypthyroidism S/p prior thyroid ablation Has had chronic and intermittent issues with compliance Has been off meds for at least 3-4 months TSH 121 on arrival,  T3/T4 both undetectable   Obesity Body mass index is 55.62 kg/m.  Encouraged lifestyle modification   Will plan to continue BB and can d/c with Zio pending cMRI results. If she has significant scarring would need to discuss.   For questions or updates, please contact [facility] Please consult [email or web address] for contact info under Cardiology/STEMI.  Signed, Joanne Freer, PA-C  07/20/2023, 10:52 AM

## 2023-07-20 NOTE — Progress Notes (Signed)
PROGRESS NOTE    Joanne Harris  HKV:[id number] DOB: Jan 06, 1981 DOA: 07/16/2023 PCP: [name], MD   Brief Narrative:  Joanne Harris is a 42 y.o. female with medical history significant of DM-2, post ablative hypothyroidism, anxiety-who started having palpitations prior to hospitalization, patient admits profound noncompliance with home thyroid supplementation, TSH is markedly elevated subsequently noted to have palpitations.  Outside facility noted wide-complex tachycardia with worsening shortness of breath and questionable chest pain undergoing synchronized cardioversion currently under sinus rhythm admitted to [facility] with cardiology consult.   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   DEPRESSION/ANXIETY   Type 2 diabetes mellitus with hyperglycemia (HCC)   Postablative hypothyroidism   Pericardial effusion  Wide-complex ventricular tachycardia Likely atrial flutter with aberrancy vs VT per cardiology EP to follow-up with cardiology -without overt obstruction/CAD Cardiac MRI pending Synchronized cardioversion at outside facility, remains in sinus rhythm on metoprolol Follow electrolytes  Large pericardial effusion -Known to be associated with longstanding hypothyroidism, cardiology recommending pericardiocentesis -unfortunately no good window for pericardiocentesis at this time -Continue diuretics per cardiology  Acute symptomatic hypothyroidism secondary to noncompliance, POA -Patient admits to noncompliance at least 3 to 4 months, TSH per chart review remains markedly elevated over the last year -Tachydysrhythmia resolved with cardioversion -Continue thyroid supplementation  Non-insulin-dependent DM-2 -uncontrolled with hyperglycemia -Again secondary to noncompliance with medication and lifestyle -A1c 10.3 confirming profoundly uncontrolled  hyperglycemia -Recently restarted metformin -Will likely transition to long-acting insulin based on sliding scale needs given her profound hyperglycemia  Morbid Obesity  Body mass index is 55.62 kg/m.   DVT prophylaxis: Lovenox *(Of note patient has been intermittently declining this medication administration) -recommend increased activity/ambulation ad lib Code Status:   Code Status: Full Code Family Communication: Mother at bedside  Status is: Inpatient  Dispo: The patient is from: Home              Anticipated d/c is to: Home              Anticipated d/c date is: 24-48 hours pending clinical course and cardiology procedure              Patient currently not medically stable for discharge  Consultants:  Cardiology  Procedures:  Cardiac Cath 07/19/2023; pericardiocentesis cancelled due to poor windows  Antimicrobials:  None indicated  Subjective: No acute issues or events overnight  Objective: Vitals:   07/19/23 1820 07/19/23 1830 07/19/23 1909 07/19/23 2100  BP: (!) 173/153  (!) 152/113 (!) 133/95  Pulse: (!) 0 (!) 0    Resp:    18  Temp:    98.5 F (36.9 C)  TempSrc:    Oral  SpO2:      Weight:      Height:        Intake/Output Summary (Last 24 hours) at 07/20/2023 5643 Last data filed at 07/19/2023 2030 Gross per 24 hour  Intake 50.67 ml  Output --  Net 50.67 ml   Filed Weights   07/16/23 0955 07/16/23 1623 07/19/23 0549  Weight: (!) 145.2 kg (!) 145.2 kg (!) 144.7 kg    Examination:  General:  Pleasantly resting in bed, No acute distress. HEENT:  Normocephalic atraumatic.  Sclerae nonicteric, noninjected.  Extraocular movements intact bilaterally. Neck:  Without mass or deformity.  Trachea is midline. Lungs:  Clear to auscultate bilaterally without rhonchi, wheeze, or rales. Heart:  Regular rate and rhythm.  Without murmurs, rubs, or gallops. Abdomen:  Soft, obese, nontender, nondistended.  Without guarding or rebound. Extremities: Without cyanosis,  clubbing, edema, or obvious deformity. Skin:  Warm and dry, no erythema.  Data Reviewed: I have personally reviewed following labs and imaging studies  CBC: Recent Labs  Lab 07/16/23 1012 07/16/23 1802 07/17/23 0431 07/19/23 1715 07/19/23 1716 07/19/23 1730  WBC 12.9* 10.4 9.5  --   --   --   HGB 12.9 12.1 10.4* 11.6* 11.2* 10.9*  HCT 38.0 36.4 31.3* 34.0* 33.0* 32.0*  MCV 96.9 97.8 98.4  --   --   --   PLT 395 373 202  --   --   --    Basic Metabolic Panel: Recent Labs  Lab 07/16/23 1012 07/16/23 1802 07/17/23 0431 07/19/23 1321 07/19/23 1715 07/19/23 1716 07/19/23 1730 07/20/23 0342  NA 135  --  134* 135 137 138 137 134*  K 3.5  --  3.6 3.2* 3.5 3.5 3.5 3.5  CL 101  --  105 101  --   --   --  100  CO2 19*  --  20* 26  --   --   --  22  GLUCOSE 248*  --  155* 123*  --   --   --  135*  BUN 15  --  13 8  --   --   --  8  CREATININE 1.10* 0.96 0.95 1.03*  --   --   --  0.97  CALCIUM 9.0  --  8.8* 9.1  --   --   --  8.9  MG 2.0  --   --  2.0  --   --   --  2.0   GFR: Estimated Creatinine Clearance: 109.5 mL/min (by C-G formula based on SCr of 0.97 mg/dL).  CBG: Recent Labs  Lab 07/18/23 1705 07/18/23 2151 07/19/23 0816 07/19/23 1253 07/19/23 2214  GLUCAP 135* 133* 154* 116* 123*    No results found for this or any previous visit (from the past 240 hour(s)).   Radiology Studies: CARDIAC CATHETERIZATION  Result Date: 07/19/2023 Conclusions: No angiographically significant coronary artery disease. Mildly to moderately elevated left heart and pulmonary artery pressures. Severely elevated right heart filling pressures. Normal Fick cardiac output/index. Hemodynamics with equivocal findings of cardiac tamponade (absent y-descent noted on RA pressure tracing, elevated right heart pressures not completely equalized with left heart pressures, and concordant LV/RV pressures). Recommendations: Proceed with cardiac MRI. Favor deferring drainage of pericardial effusion at  this time, given lack of symptoms and equivocal hemodynamic findings of tamponade physiology. Primary prevention of coronary artery disease. Yvonne Kendall, MD [facility]   Scheduled Meds:  enoxaparin (LOVENOX) injection  70 mg Subcutaneous Q24H   furosemide  20 mg Intravenous Once   insulin aspart  0-9 Units Subcutaneous TID WC   levothyroxine  50 mcg Oral Q0600   metoprolol succinate  25 mg Oral Daily   sodium chloride flush  3 mL Intravenous Q12H   sodium chloride flush  3 mL Intravenous Q12H   spironolactone  25 mg Oral Daily   Continuous Infusions:  sodium chloride     sodium chloride Stopped (07/19/23 1632)   sodium chloride       LOS: 3 days   Time spent:  Azucena Fallen, DO [facility]  If 7PM-7AM, please contact night-coverage [email or web address]  07/20/2023, 7:22 AM

## 2023-07-21 ENCOUNTER — Inpatient Hospital Stay (HOSPITAL_COMMUNITY)
Admit: 2023-07-21 | Discharge: 2023-07-21 | Disposition: A | Discharge status: Home / Self Care | Payer: 59 | Attending: Student

## 2023-07-21 ENCOUNTER — Other Ambulatory Visit (HOSPITAL_COMMUNITY): Payer: Self-pay

## 2023-07-21 DIAGNOSIS — I472 Ventricular tachycardia, unspecified: Secondary | ICD-10-CM | POA: Diagnosis not present

## 2023-07-21 DIAGNOSIS — I3139 Other pericardial effusion (noninflammatory): Secondary | ICD-10-CM | POA: Diagnosis not present

## 2023-07-21 LAB — LIPOPROTEIN A (LPA): Lipoprotein (a): 168.3 nmol/L — ABNORMAL HIGH (ref <75.0)

## 2023-07-21 LAB — BASIC METABOLIC PANEL
Anion gap: 10 (ref 5–15)
BUN: 9 mg/dL (ref 6–20)
CO2: 24 mmol/L (ref 22–32)
Calcium: 9.3 mg/dL (ref 8.9–10.3)
Chloride: 102 mmol/L (ref 98–111)
Creatinine, Ser: 1.01 mg/dL — ABNORMAL HIGH (ref 0.44–1.00)
GFR, Estimated: >60 mL/min (ref >60)
Glucose, Bld: 138 mg/dL — ABNORMAL HIGH (ref 70–99)
Potassium: 3.8 mmol/L (ref 3.5–5.1)
Sodium: 136 mmol/L (ref 135–145)

## 2023-07-21 LAB — GLUCOSE, CAPILLARY
Glucose-Capillary: 167 mg/dL — ABNORMAL HIGH (ref 70–99)
Glucose-Capillary: 103 mg/dL — ABNORMAL HIGH (ref 70–99)
Glucose-Capillary: 146 mg/dL — ABNORMAL HIGH (ref 70–99)

## 2023-07-21 LAB — MAGNESIUM: Magnesium: 2.1 mg/dL (ref 1.7–2.4)

## 2023-07-21 MED ORDER — ACETAMINOPHEN 325 MG PO TABS: 650.0000 mg | ORAL_TABLET | Freq: Four times a day (QID) | ORAL | Status: DC | PRN

## 2023-07-21 MED ORDER — LORAZEPAM 2 MG/ML IJ SOLN
1.0000 mg | Freq: Once | INTRAMUSCULAR | Status: AC
  Administered 2023-07-21: 1 mg via INTRAVENOUS
  Filled 2023-07-21: qty 1

## 2023-07-21 MED ORDER — LEVOTHYROXINE SODIUM 100 MCG PO TABS
100.0000 ug | ORAL_TABLET | Freq: Every day | ORAL | 2 refills | Status: DC
  Filled 2023-07-21: qty 30, 30d supply, fill #0

## 2023-07-21 MED ORDER — SPIRONOLACTONE 25 MG PO TABS
25.0000 mg | ORAL_TABLET | Freq: Every day | ORAL | 0 refills | Status: DC
  Filled 2023-07-21: qty 30, 30d supply, fill #0

## 2023-07-21 MED ORDER — METOPROLOL SUCCINATE ER 25 MG PO TB24
25.0000 mg | ORAL_TABLET | Freq: Every day | ORAL | 2 refills | Status: DC
  Filled 2023-07-21: qty 30, 30d supply, fill #0

## 2023-07-21 NOTE — Discharge Summary (Addendum)
DISCHARGE SUMMARY  Joanne Harris  MR#: [id number]  DOB:[birth year]  Date of Admission: 07/16/2023 Date of Discharge: 07/21/2023  Attending Physician:Evi Mccomb Silvestre Gunner, MD  Patient's WUJ:WJXBJY, Helane Rima, MD  Consults: Cardiology EP  Disposition: D/C home   Follow-up Appts:  Follow-up Information     O'Neal, Ronnald Ramp, MD Follow up.   Specialties: Cardiology, Internal Medicine, Radiology Why: 10/16 @9 :40 am Contact information: [address] [place] [address] [phone]         [name], MD Follow up in 2 week(s).   Specialty: Family Medicine Contact information: [address] [place] [address] [phone]                 Tests Needing Follow-up: -Follow-up on 24-hour urine analysis for light chains -Will need reassessment of her TSH in 6-8 weeks of consistent Synthroid treatment -reassess need for ongoing aldactone therapy after f/u of effusion by Cardiology   Discharge Diagnoses: Wide-complex tachycardia Large pericardial effusion Potential infiltrative cardiomyopathy Acute symptomatic hypothyroidism -history of thyroid ablation - noncompliance with thyroid hormone replacement DM2 uncontrolled with hyperglycemia Morbid obesity - Body mass index is 55.62 kg/m.  Initial presentation: 42 year old with a history of DM2, post ablation hypothyroidism, and anxiety disorder who presented to the hospital 9/20 with palpitations. She admitted to noncompliance with her thyroid supplement and TSH according was found to be markedly elevated. She was found to have wide-complex tachycardia with shortness of breath and vague chest pain at her initial presentation and underwent synchronized cardioversion at an outside facility. She converted to sinus rhythm and was subsequently transferred to [facility] Course:  Ventricular tachycardia care per cardiology/EP - underwent [facility] cardioversion at  [facility] - remains in sinus rhythm on metoprolol - left heart cath revealed nonobstructive CAD - likely related to severe profound hypothyroidism - cardiac MRI without strong evidence to suggest an infiltrative process - ZIO patch placed by EP prior to d/c home    Large pericardial effusion Felt to be related to hypothyroidism - no evidence of tamponade by right heart cath - pericardiocentesis recommended by cardiology but no good windows were appreciated for intervention-continue diuretic with Aldactone 25 mg daily even after discharge -symptom-free at time of discharge - reassess need for ongoing aldactone therapy after f/u of effusion by Cardiology   Potential infiltrative cardiomyopathy cardiac MRI completed during this admission without definitive or convincing evidence of this - clinically it is felt this is likely not the case with findings most likely explained by myxedema of severe hypothyroidism -24-hour urine for light chain analysis completed during this hospital stay with results pending at discharge   Acute symptomatic hypothyroidism -history of thyroid ablation - noncompliance with thyroid hormone replacement Had not been taking her Synthroid for greater than 3 months - TSH 121 on arrival - continue thyroid supplement, and resume full prior home dose of at time of d/c  -patient has been educated on the extreme importance of strict compliance with her Synthroid treatment - f/u TSH in 6-8 weeks    DM2 uncontrolled with hyperglycemia Was noncompliant with diabetic medication for greater than 3 months -A1c 10.3 -CBG reasonably controlled during her hospital stay -encouraged to comply strictly with her previous home diabetes regimen   Morbid obesity - Body mass index is 55.62 kg/m.  Allergies as of 07/21/2023   No Known Allergies      Medication List     TAKE these medications  acetaminophen 325 MG tablet Commonly known as: TYLENOL Take 2 tablets (650 mg  total) by mouth every 6 (six) hours as needed for mild pain, fever or headache.   glucose blood test strip Commonly known as: OneTouch Verio Use to test blood sugar 2 times daily as instructed.   levothyroxine 100 MCG tablet Commonly known as: SYNTHROID Take 1 tablet (100 mcg total) by mouth daily at 6 (six) AM. Start taking on: July 22, 2023 What changed: when to take this   metoprolol succinate 25 MG 24 hr tablet Commonly known as: TOPROL-XL Take 1 tablet (25 mg total) by mouth daily. Start taking on: July 22, 2023   OneTouch Delica Lancets Fine Misc Use to test blood sugar 2 times daily as instructed.   sertraline 100 MG tablet Commonly known as: ZOLOFT TAKE 1 TABLET(100 MG) BY MOUTH DAILY   spironolactone 25 MG tablet Commonly known as: ALDACTONE Take 1 tablet (25 mg total) by mouth daily. Start taking on: July 22, 2023   tirzepatide 2.5 MG/0.5ML Pen Commonly known as: MOUNJARO Inject 2.5 mg into the skin once a week.        Day of Discharge BP 123/77 (BP Location: Left Arm)   Pulse 83   Temp 98.3 F (36.8 C) (Oral)   Resp 18   Ht 5' 3.5" (1.613 m)   Wt (!) 144.7 kg   SpO2 100%   BMI 55.62 kg/m   Physical Exam: General: No acute respiratory distress Lungs: Clear to auscultation bilaterally without wheezes or crackles Cardiovascular: Regular rate and rhythm without murmur gallop or rub normal S1 and S2 Abdomen: Nontender, nondistended, soft, bowel sounds positive, no rebound, no ascites, no appreciable mass Extremities: No significant cyanosis, clubbing, or edema bilateral lower extremities  Basic Metabolic Panel: Recent Labs  Lab 07/16/23 1012 07/16/23 1802 07/17/23 0431 07/19/23 1321 07/19/23 1715 07/19/23 1716 07/19/23 1730 07/20/23 0342 07/21/23 0936  NA 135  --  134* 135 137 138 137 134* 136  K 3.5  --  3.6 3.2* 3.5 3.5 3.5 3.5 3.8  CL 101  --  105 101  --   --   --  100 102  CO2 19*  --  20* 26  --   --   --  22 24   GLUCOSE 248*  --  155* 123*  --   --   --  135* 138*  BUN 15  --  13 8  --   --   --  8 9  CREATININE 1.10* 0.96 0.95 1.03*  --   --   --  0.97 1.01*  CALCIUM 9.0  --  8.8* 9.1  --   --   --  8.9 9.3  MG 2.0  --   --  2.0  --   --   --  2.0 2.1    CBC: Recent Labs  Lab 07/16/23 1012 07/16/23 1802 07/17/23 0431 07/19/23 1715 07/19/23 1716 07/19/23 1730  WBC 12.9* 10.4 9.5  --   --   --   HGB 12.9 12.1 10.4* 11.6* 11.2* 10.9*  HCT 38.0 36.4 31.3* 34.0* 33.0* 32.0*  MCV 96.9 97.8 98.4  --   --   --   PLT 395 373 202  --   --   --     Time spent in discharge (includes decision making & examination of pt): 35 minutes  07/21/2023, 4:40 PM   Lonia Blood, MD [facility] Office  [phone]

## 2023-07-21 NOTE — Progress Notes (Signed)
Hospital applied ZIO AT [id number] which would not connect. Hospital applied second ZIO AT monitor serial # [id number]. Murriel Hopper from [place] notified to cancel charges on first monitor and associate second serial# with the patients enrollment.  Dr. Flora Lipps to read.

## 2023-07-21 NOTE — Plan of Care (Signed)

## 2023-07-21 NOTE — Progress Notes (Signed)
  Patient Name: Joanne Harris Date of Encounter: 07/21/2023  Primary Cardiologist: None Electrophysiologist: None  Interval Summary   Feeling OK this am. No further tachy-arryhtmias  cMRI inconclusive. Plan to repeat GAD portion this am.   Vital Signs    Vitals:   07/20/23 1209 07/20/23 2014 07/21/23 0500 07/21/23 0822  BP: 116/78 (!) 143/93 123/77   Pulse: 81 80 79 83  Resp: 12 17 18 18   Temp: 98.1 F (36.7 C) 98.1 F (36.7 C) 98.3 F (36.8 C) 98.3 F (36.8 C)  TempSrc: Oral Oral Oral Oral  SpO2: 97% 100% 100%   Weight:      Height:        Intake/Output Summary (Last 24 hours) at 07/21/2023 0843 Last data filed at 07/21/2023 1610 Gross per 24 hour  Intake --  Output 300 ml  Net -300 ml   Filed Weights   07/16/23 0955 07/16/23 1623 07/19/23 0549  Weight: (!) 145.2 kg (!) 145.2 kg (!) 144.7 kg    Physical Exam    GEN- The patient is well appearing, alert and oriented x 3 today.   Lungs- Clear to ausculation bilaterally, normal work of breathing Cardiac- Regular rate and rhythm, no murmurs, rubs or gallops GI- soft, NT, ND, + BS Extremities- no clubbing or cyanosis. No edema  Telemetry    NSR 70-80s (personally reviewed)  Hospital Course    Joanne Harris is a 42 y.o. female with a history of hypothyroidism related to prior thyroid ablation, obesity, DM2 who is being seen for the evaluation of WCT at the request of Dr. Flora Lipps.    [facility] felt to be in setting of severe, untreated hypothyroidism.   L/RHC with stable pressures and non-obstructive CAD.   cMRI with clarification pending.   Assessment & Plan    Ventricular tachycardia Likely related to her severe, profound hypothyroidism L/RHC without definite tamponade and no obstructive disease cMRI inconclusive. Clarification pending.  Potassium3.5 (09/24 0342) Magnesium  2.0 (09/24 0342) Creatinine, ser  0.97 (09/24 0342) Keep K > 4.0 and Mg > 2.0  If cMRI remains suggestive of infiltrative  process -> Loop prior to d/c If cMRI WNL will plan Zio and aggressive thyroid management.    Moderate to large pericardial effusion No evidence of tamponade by RHC No good windows for percutaneous intervention Likely 2/2 severe hypothyroidism No change.    Severe hypthyroidism S/p prior thyroid ablation Has had chronic and intermittent issues with compliance Has been off meds for at least 3-4 months TSH 121 on arrival,  T3/T4 both undetectable Will need close follow up.    Obesity Body mass index is 55.62 kg/m.  Encouraged lifestyle modification   Final disposition pending cMRI clarification. Likely home today with either a loop or Zio.   For questions or updates, please contact [facility] Please consult [email or web address] for contact info under Cardiology/STEMI.  SignedGraciella Freer, PA-C  07/21/2023, 8:43 AM

## 2023-07-21 NOTE — Progress Notes (Signed)
Cardiology Progress Note  Patient ID: Joanne Harris MRN: [id number] DOB: [birth year] Date of Encounter: 07/21/2023  Primary Cardiologist: None  Subjective   Chief Complaint: None.   HPI: Reports no chest pains or trouble breathing.  Hemodynamically stable.  Telemetry is normal.  ROS:  All other ROS reviewed and negative. Pertinent positives noted in the HPI.     Inpatient Medications  Scheduled Meds:  enoxaparin (LOVENOX) injection  70 mg Subcutaneous Q24H   insulin aspart  0-9 Units Subcutaneous TID WC   levothyroxine  50 mcg Oral Q0600   LORazepam  1 mg Intravenous Once   metoprolol succinate  25 mg Oral Daily   sodium chloride flush  3 mL Intravenous Q12H   sodium chloride flush  3 mL Intravenous Q12H   spironolactone  25 mg Oral Daily   Continuous Infusions:  sodium chloride     sodium chloride Stopped (07/19/23 1632)   sodium chloride     PRN Meds: sodium chloride, sodium chloride, acetaminophen **OR** acetaminophen, hydrALAZINE, levalbuterol, loperamide, melatonin, polyethylene glycol, sodium chloride flush, sodium chloride flush   Vital Signs   Vitals:   07/20/23 0918 07/20/23 1209 07/20/23 2014 07/21/23 0500  BP: (!) 118/91 116/78 (!) 143/93 123/77  Pulse: 77 81 80 79  Resp: 18 12 17 18   Temp: 98.1 F (36.7 C) 98.1 F (36.7 C) 98.1 F (36.7 C) 98.3 F (36.8 C)  TempSrc: Oral Oral Oral Oral  SpO2: 99% 97% 100% 100%  Weight:      Height:        Intake/Output Summary (Last 24 hours) at 07/21/2023 0753 Last data filed at 07/21/2023 0644 Gross per 24 hour  Intake --  Output 200 ml  Net -200 ml      07/19/2023    5:49 AM 07/16/2023    4:23 PM 07/16/2023    9:55 AM  Last 3 Weights  Weight (lbs) 319 lb 320 lb 320 lb  Weight (kg) 144.697 kg 145.151 kg 145.151 kg      Telemetry  Overnight telemetry shows sinus rhythm 70 to 80 bpm, which I personally reviewed.   Physical Exam   Vitals:   07/20/23 0918 07/20/23 1209 07/20/23 2014 07/21/23 0500   BP: (!) 118/91 116/78 (!) 143/93 123/77  Pulse: 77 81 80 79  Resp: 18 12 17 18   Temp: 98.1 F (36.7 C) 98.1 F (36.7 C) 98.1 F (36.7 C) 98.3 F (36.8 C)  TempSrc: Oral Oral Oral Oral  SpO2: 99% 97% 100% 100%  Weight:      Height:        Intake/Output Summary (Last 24 hours) at 07/21/2023 0753 Last data filed at 07/21/2023 0644 Gross per 24 hour  Intake --  Output 200 ml  Net -200 ml       07/19/2023    5:49 AM 07/16/2023    4:23 PM 07/16/2023    9:55 AM  Last 3 Weights  Weight (lbs) 319 lb 320 lb 320 lb  Weight (kg) 144.697 kg 145.151 kg 145.151 kg    Body mass index is 55.62 kg/m.  General: Well nourished, well developed, in no acute distress Head: Atraumatic, normal size  Eyes: PEERLA, EOMI  Neck: Supple, no JVD Endocrine: No thryomegaly Cardiac: Normal S1, S2; RRR; no murmurs, rubs, or gallops Lungs: Clear to auscultation bilaterally, no wheezing, rhonchi or rales  Abd: Soft, nontender, no hepatomegaly  Ext: No edema, pulses 2+ Musculoskeletal: No deformities, BUE and BLE strength normal and equal Skin: Warm and  dry, no rashes   Neuro: Alert and oriented to person, place, time, and situation, CNII-XII grossly intact, no focal deficits  Psych: Normal mood and affect   Labs  High Sensitivity Troponin:   Recent Labs  Lab 07/16/23 1802 07/16/23 2026  TROPONINIHS 61* 60*     Cardiac EnzymesNo results for input(s): "TROPONINI" in the last 168 hours. No results for input(s): "TROPIPOC" in the last 168 hours.  Chemistry Recent Labs  Lab 07/17/23 0431 07/19/23 1321 07/19/23 1715 07/19/23 1716 07/19/23 1730 07/20/23 0342  NA 134* 135   < > 138 137 134*  K 3.6 3.2*   < > 3.5 3.5 3.5  CL 105 101  --   --   --  100  CO2 20* 26  --   --   --  22  GLUCOSE 155* 123*  --   --   --  135*  BUN 13 8  --   --   --  8  CREATININE 0.95 1.03*  --   --   --  0.97  CALCIUM 8.8* 9.1  --   --   --  8.9  GFRNONAA >60 >60  --   --   --  >60  ANIONGAP 9 8  --   --   --  12    < > = values in this interval not displayed.    Hematology Recent Labs  Lab 07/16/23 1012 07/16/23 1802 07/17/23 0431 07/19/23 1715 07/19/23 1716 07/19/23 1730  WBC 12.9* 10.4 9.5  --   --   --   RBC 3.92 3.72* 3.18*  --   --   --   HGB 12.9 12.1 10.4* 11.6* 11.2* 10.9*  HCT 38.0 36.4 31.3* 34.0* 33.0* 32.0*  MCV 96.9 97.8 98.4  --   --   --   MCH 32.9 32.5 32.7  --   --   --   MCHC 33.9 33.2 33.2  --   --   --   RDW 13.2 13.2 13.3  --   --   --   PLT 395 373 202  --   --   --    BNP Recent Labs  Lab 07/16/23 1012  BNP 428.0*    DDimer  Recent Labs  Lab 07/16/23 1012  DDIMER 0.28     Radiology  CARDIAC CATHETERIZATION  Result Date: 07/19/2023 Conclusions: No angiographically significant coronary artery disease. Mildly to moderately elevated left heart and pulmonary artery pressures. Severely elevated right heart filling pressures. Normal Fick cardiac output/index. Hemodynamics with equivocal findings of cardiac tamponade (absent y-descent noted on RA pressure tracing, elevated right heart pressures not completely equalized with left heart pressures, and concordant LV/RV pressures). Recommendations: Proceed with cardiac MRI. Favor deferring drainage of pericardial effusion at this time, given lack of symptoms and equivocal hemodynamic findings of tamponade physiology. Primary prevention of coronary artery disease. Yvonne Kendall, MD [facility]   Cardiac Studies  TTE 07/17/2023  1. Limited echo windows- Definity contrast was not administered. Left  ventricular ejection fraction, by estimation, is 60 to 65%. The left  ventricle has normal function. The left ventricle has no regional wall  motion abnormalities. There is moderate  left ventricular hypertrophy. Left ventricular diastolic parameters are  consistent with Grade I diastolic dysfunction (impaired relaxation).   2. Poorly visualized, appears underfilled. RA and RV collapse noted. RVEF  is reduced. Right  ventricular systolic function is moderately reduced. The  right ventricular size is small. There is  normal pulmonary artery systolic  pressure. The estimated right   ventricular systolic pressure is 11.7 mmHg.   3. Cannot rule-out tamponade physiology. Large pericardial effusion. The  pericardial effusion is circumferential.   4. The mitral valve is grossly normal. Trivial mitral valve  regurgitation.   5. The aortic valve was not well visualized. Aortic valve regurgitation  is not visualized. No aortic stenosis is present.   6. IVC is poorly visualized, but does not appear dilated.   LHC/RHC 07/19/2023 Conclusions: No angiographically significant coronary artery disease. Mildly to moderately elevated left heart and pulmonary artery pressures. Severely elevated right heart filling pressures. Normal Fick cardiac output/index. Hemodynamics with equivocal findings of cardiac tamponade (absent y-descent noted on RA pressure tracing, elevated right heart pressures not completely equalized with left heart pressures, and concordant LV/RV pressures).  Patient Profile  Joanne Harris is a 42 y.o. female with 61-year-old female with history of hypothyroidism related to prior thyroid ablation, obesity, diabetes who was admitted on 07/16/2023 with ventricular tachycardia. Course complicated by moderate to large pericardial effusion.   Assessment & Plan   # Ventricular tachycardia -Admitted with VT.  Status post cardioversion in the ER.  Found to be severely hypothyroid which is likely the culprit. -No obstructive CAD. -Cardiac MRI is a bit inconclusive.  Plan to repeat late gadolinium enhancement study today.  Possible concern for cardiac amyloidosis but I do not believe this is the case.  We have sent serum and urine immunofixation.  She also has light chains collection which will be completed at 5:30 PM today. -Will continue beta-blocker. -Anticipate discharge late afternoon.  EP will await  results of cardiac MRI and may pursue ZIO patch versus loop recorder.  # Large pericardial effusion # Elevated filling pressures -No evidence of tamponade on cardiac MRI or cardiac catheterization.  I believe this is all related to being severely hypothyroid.  We will follow this closely in the outpatient setting. -She does have some element of diastolic dysfunction but nothing restrictive to suggest cardiac amyloidosis in my opinion.  She was given Lasix as a one-time dose and is improving. -Would recommend she go home on Aldactone 25 mg daily.  This can help with blood pressure and volume status.  # Potential infiltrative cardiomyopathy? -Repeating cardiac MRI.  I think it is extremely unlikely that this patient has cardiac amyloidosis.  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  I have ordered serum immunofixation as well as 24-hour light chain collection.  She will complete her light chain collection at 5:30 PM.  Can either be discharged late afternoon pending EP recommendations of loop recorder versus Zio patch.  If she stays 1 more day no concerns there.  We would like to have a complete and thorough workup prior to discharge.  # Severe hypothyroidism -Per hospital medicine  # Uncontrolled diabetes -Per hospital medicine  # Hypertension -She will go home on metoprolol and Aldactone.      For questions or updates, please contact [facility] Please consult [email or web address] for contact info under        Signed, Gerri Spore T. Flora Lipps, MD, [facility]  07/21/2023 7:53 AM

## 2023-07-22 ENCOUNTER — Telehealth: Payer: Self-pay

## 2023-07-22 NOTE — Transitions of Care (Post Inpatient/ED Visit) (Signed)
07/22/2023  Name: Joanne Harris MRN: [id number] DOB: 1981-01-16  Today's TOC FU Call Status: Today's TOC FU Call Status:: Successful TOC FU Call Completed TOC FU Call Complete Date: 07/22/23 Patient's Name and Date of Birth confirmed.  Transition Care Management Follow-up Telephone Call Date of Discharge: 07/21/23 Discharge Facility: [facility]) Type of Discharge: Inpatient Admission Primary Inpatient Discharge Diagnosis:: Tachyarrthymia How have you been since you were released from the hospital?: Better (Pleasant, talkative and cooperative. States she is feeling much better, She was able to get some sleep . She is planning on initiating  some lifestyle changes) Any questions or concerns?: No  Items Reviewed: Did you receive and understand the discharge instructions provided?: Yes Medications obtained,verified, and reconciled?: Yes (Medications Reviewed) Any new allergies since your discharge?: No Dietary orders reviewed?: Yes Type of Diet Ordered:: CCHO Do you have support at home?: No (Lives alone, She does have support she can reach out to if needed)  Medications Reviewed Today: Medications Reviewed Today     Reviewed by Johnnette Barrios, RN (Registered Nurse) on 07/22/23 at 1057  Med List Status: <None>   Medication Order Taking? Sig Documenting Provider Last Dose Status Informant  acetaminophen (TYLENOL) 325 MG tablet [id number] Yes Take 2 tablets (650 mg total) by mouth every 6 (six) hours as needed for mild pain, fever or headache. Lonia Blood, MD Taking Active   glucose blood [facility] VERIO) test strip [id number] No Use to test blood sugar 2 times daily as instructed.  Patient not taking: Reported on 07/22/2023   Carlus Pavlov, MD Not Taking Active Self  levothyroxine (SYNTHROID) 100 MCG tablet [id number] Yes Take 1 tablet (100 mcg total) by mouth daily at 6 (six) AM. Lonia Blood, MD Taking Active   Melatonin 5 MG CAPS [id number] Yes Take 1  capsule by mouth at bedtime. [provider] Taking Active Self  metoprolol succinate (TOPROL-XL) 25 MG 24 hr tablet [id number] Yes Take 1 tablet (25 mg total) by mouth daily. Lonia Blood, MD Taking Active   [facility] LANCETS FINE [place] [id number] Yes Use to test blood sugar 2 times daily as instructed. Carlus Pavlov, MD Taking Active Self  sertraline (ZOLOFT) 100 MG tablet [id number] Yes TAKE 1 TABLET(100 MG) BY MOUTH DAILY [name], MD Taking Active Self  spironolactone (ALDACTONE) 25 MG tablet [id number] Yes Take 1 tablet (25 mg total) by mouth daily. Lonia Blood, MD Taking Active   tirzepatide [facility]) 2.5 MG/0.5ML Pen [id number] No Inject 2.5 mg into the skin once a week.  Patient not taking: Reported on 07/22/2023   [name], MD Not Taking Active Self           Med Note Kandis Cocking [place], [place] A   Fri Jul 16, 2023  4:33 PM) Patient states that she has not picked up from pharmacy yet.            Home Care and Equipment/Supplies: Were Home Health Services Ordered?: No Any new equipment or medical supplies ordered?: No  Functional Questionnaire: Do you need assistance with bathing/showering or dressing?: No Do you need assistance with meal preparation?: No Do you need assistance with eating?: No Do you have difficulty maintaining continence: No Do you need assistance with getting out of bed/getting out of a chair/moving?: No Do you have difficulty managing or taking your medications?: No  Follow up appointments reviewed: PCP Follow-up appointment confirmed?: Yes (Care Guide assisted with rescheduling PCP appt due to conflicting appts .  Pt accepted updated appt and will cancel initial appt) Date of PCP follow-up appointment?: 07/28/23 Follow-up Provider: Dr Beverely Low [facility] Follow-up appointment confirmed?: Yes Date of Specialist follow-up appointment?: 08/11/23 Follow-Up Specialty Provider:: Dr Lennie Odor Do you  need transportation to your follow-up appointment?: No Do you understand care options if your condition(s) worsen?: Yes-patient verbalized understanding  SDOH Interventions Today    Flowsheet Row Most Recent Value  SDOH Interventions   Food Insecurity Interventions Intervention Not Indicated  Housing Interventions Intervention Not Indicated  Transportation Interventions Intervention Not Indicated  Alcohol Usage Interventions Intervention Not Indicated (Score <7)       Goals Addressed             This Visit's Progress    TOC Care plan       Current Barriers:  Knowledge Deficits related to plan of care for management of Tachyarrhythmia,Hyperglycemia    RNCM Clinical Goal(s):  Patient will verbalize basic understanding of  Abnormal heart rates, and s/s hyperglycemia  disease process and self health management plan as evidenced by keeping Zio patch on x 14 days as directed, checking BG 2 x day as directed  take all medications exactly as prescribed and will call provider for medication related questions as evidenced by medication compliance, refills as needed HR will remain within range  of 60-100 and BG will remain < 150  attend all scheduled medical appointments: as scheduled  as evidenced by no missed appointed , if change is needed appt is rescheduled within appropriate time frame   through collaboration with RN Care manager, provider, and care team.   Interventions:  Inter-disciplinary care team collaboration (see longitudinal plan of care) Evaluation of current treatment plan related to  self management and patient's adherence to plan as established by provider   Tachyarrhythmias and Hyperglycemia  (Status:  New goal.)  Short Term Goal Evaluation of current treatment plan related to  New Medications and Medication dose adjustment  , self-management and patient's adherence to plan as established by provider. Discussed plans with patient for ongoing care management follow up and  provided patient with direct contact information for care management team Reviewed medications with patient and discussed new medications and dose changes for current medications  Reviewed scheduled/upcoming provider appointments including PCP post discharge f/u, Cardio f/u  Assessed social determinant of health barriers  Patient Goals/Self-Care Activities: Take all medications as prescribed Attend all scheduled provider appointments Call pharmacy for medication refills 3-7 days in advance of running out of medications Call provider office for new concerns or questions   Follow Up Plan:  Telephone follow up appointment with care management team member scheduled for:  07/29/23 10:30 am           Susa Loffler , BSN, RN Care Management Coordinator [facility]   [facility] [email or web address] Direct Dial: [phone]

## 2023-07-23 LAB — IMMUNOFIXATION, URINE

## 2023-07-23 LAB — IMMUNOFIXATION ELECTROPHORESIS
IgA: 208 mg/dL (ref 87–352)
IgG (Immunoglobin G), Serum: 1459 mg/dL (ref 586–1602)
IgM (Immunoglobulin M), Srm: 97 mg/dL (ref 26–217)
Total Protein ELP: 7.6 g/dL (ref 6.0–8.5)

## 2023-07-26 LAB — UIFE/LIGHT CHAINS/TP QN, 24-HR UR
FR KAPPA LT CH,24HR: 100.08 mg/(24.h)
FR LAMBDA LT CH,24HR: 13.34 mg/(24.h)
Free Kappa Lt Chains,Ur: 15.38 mg/L (ref 1.17–86.46)
Free Kappa/Lambda Ratio: 7.5 (ref 1.83–14.26)
Free Lambda Lt Chains,Ur: 2.05 mg/L (ref 0.27–15.21)
Total Protein, Urine-Ur/day: 1074 mg/(24.h) — ABNORMAL HIGH (ref 30–150)
Total Protein, Urine: 16.5 mg/dL
Total Volume: 650

## 2023-07-28 ENCOUNTER — Encounter: Payer: Self-pay | Admitting: Family Medicine

## 2023-07-28 ENCOUNTER — Ambulatory Visit: Payer: 59 | Admitting: Family Medicine

## 2023-07-28 VITALS — BP 130/78 | HR 80 | Temp 97.9°F | Wt 310.8 lb

## 2023-07-28 DIAGNOSIS — Z7985 Long-term (current) use of injectable non-insulin antidiabetic drugs: Secondary | ICD-10-CM

## 2023-07-28 DIAGNOSIS — Z23 Encounter for immunization: Secondary | ICD-10-CM | POA: Diagnosis not present

## 2023-07-28 DIAGNOSIS — E1165 Type 2 diabetes mellitus with hyperglycemia: Secondary | ICD-10-CM

## 2023-07-28 DIAGNOSIS — F341 Dysthymic disorder: Secondary | ICD-10-CM

## 2023-07-28 DIAGNOSIS — E890 Postprocedural hypothyroidism: Secondary | ICD-10-CM

## 2023-07-28 DIAGNOSIS — I3139 Other pericardial effusion (noninflammatory): Secondary | ICD-10-CM | POA: Diagnosis not present

## 2023-07-28 MED ORDER — ONDANSETRON HCL 4 MG PO TABS: 4.0000 mg | ORAL_TABLET | Freq: Three times a day (TID) | ORAL | 0 refills | Status: DC | PRN

## 2023-07-28 MED ORDER — BUPROPION HCL 75 MG PO TABS: 75.0000 mg | ORAL_TABLET | Freq: Two times a day (BID) | ORAL | 3 refills | Status: DC

## 2023-07-28 NOTE — Patient Instructions (Signed)
Follow up in 3 weeks to recheck thyroid START the Bupropion (Wellbutrin) twice daily CONTINUE the Levothyroxine daily PICK UP and START the Mounjaro weekly Continue the Metoprolol and Aldactone until Cardiology says otherwise Call with any questions or concerns Stay Safe!  Stay Healthy! Make sure you are taking care of you!!!  You are worth it!

## 2023-07-28 NOTE — Progress Notes (Signed)
   Subjective:    Patient ID: Joanne Harris, female    DOB: 04-Jul-1981, 42 y.o.   MRN: [id number]  HPI Hospital f/u- pt was admitted 9/20-9/25 w/ palpitations, SOB, CP.  She was found to have wide complex tachycardia.  She had a synchronized cardioversion at [facility] and was then transferred to [facility].  Started on Metoprolol.  Given a Zio patch prior to d/c.  She was also found to have a large pericardial effusion.  R heart cath did not show evidence of tamponade.  Pericardiocentesis was recommended but no good window identified to do so.  She was treated w/ Aldactone 25mg  daily and was asymptomatic by time of d/c  There was concern for a possible infiltrative process but cardiac MRI did not show any definitive evidence and subsequent 24 hr urine did not show any abnormalities.    As all of her sxs were likely due to untreated hypothyroid and DM it was stressed to her during her hospitalization that she needed to strictly comply w/ medications and f/u appts.  Restarted Levothyroxine daily.  Has not yet started [facility].  She has cardiology f/u on 10/16  Today, pt denies CP.  Will feel that it's racing but when she checks her watch, 'it seems normal'.  + fatigue.  Some SOB.  + anxiety- pt is embarrassed that she let her situation get this bad.     Review of Systems For ROS see HPI     Objective:   Physical Exam Vitals reviewed.  Constitutional:      General: She is not in acute distress.    Appearance: She is well-developed. She is obese. She is not ill-appearing.  HENT:     Head: Normocephalic and atraumatic.  Eyes:     Extraocular Movements: Extraocular movements intact.     Conjunctiva/sclera: Conjunctivae normal.     Pupils: Pupils are equal, round, and reactive to light.     Comments: + exophthalmos  Neck:     Thyroid: No thyromegaly.  Cardiovascular:     Rate and Rhythm: Normal rate and regular rhythm.     Heart sounds: Normal heart sounds. No murmur  heard. Pulmonary:     Effort: Pulmonary effort is normal. No respiratory distress.     Breath sounds: Normal breath sounds.  Abdominal:     General: There is no distension.     Palpations: Abdomen is soft.     Tenderness: There is no abdominal tenderness.  Musculoskeletal:     Cervical back: Normal range of motion and neck supple.     Right lower leg: No edema.     Left lower leg: No edema.  Lymphadenopathy:     Cervical: No cervical adenopathy.  Skin:    General: Skin is warm and dry.  Neurological:     Mental Status: She is alert and oriented to person, place, and time.  Psychiatric:        Behavior: Behavior normal.     Comments: anxious           Assessment & Plan:

## 2023-07-29 ENCOUNTER — Other Ambulatory Visit: Payer: 59

## 2023-07-29 ENCOUNTER — Telehealth: Payer: Self-pay

## 2023-07-29 NOTE — Patient Outreach (Signed)
Care Management  Transitions of Care Program Transitions of Care Post-discharge week 2   07/29/2023 Name: Joanne Harris MRN: [id number] DOB: 13-Sep-1981  Subjective: Joanne Harris is a 42 y.o. year old female who is a primary care patient of Tabori, Helane Rima, MD. The Care Management team Engaged with patient Engaged with patient by telephone to assess and address transitions of care needs.   Consent to Services:  Patient was given information about care management services, agreed to services, and gave verbal consent to participate.   Assessment:   Patient A & O x 4 She reports no significant concerns, issues or SE.She did report occ nausea was seem by PCP 10/2 post hospital discharge, Has medication changes and NO for Zofran for Nausea . She is resuming Mounjaro this week . F/U appts scheduled per recc   She denies any SDOH barriers       SDOH Interventions    Flowsheet Row Telephone from 07/29/2023 in [place] [facility] DEPARTMENT Telephone from 07/22/2023 in  POPULATION HEALTH DEPARTMENT Office Visit from 07/06/2023 in [facility] Visit from 06/01/2022 in [facility] Visit from 09/04/2021 in [facility] [employer] Visit from 04/23/2021 in [facility] [employer]  SDOH Interventions        Food Insecurity Interventions -- Intervention Not Indicated -- -- -- --  Housing Interventions Intervention Not Indicated Intervention Not Indicated -- -- -- --  Transportation Interventions Intervention Not Indicated Intervention Not Indicated -- -- -- --  Alcohol Usage Interventions -- Intervention Not Indicated (Score <7) -- -- -- --  Depression Interventions/Treatment  -- -- Medication, Currently on Treatment Medication, Currently on Treatment Medication, Counseling Medication, Currently on Treatment   Physical Activity Interventions Intervention Not Indicated -- -- -- -- --  Health Literacy Interventions Intervention Not Indicated -- -- -- -- --        Goals Addressed             This Visit's Progress    TOC Care plan       Current Barriers:  Knowledge Deficits related to plan of care for management of Tachyarrhythmia,Hyperglycemia    RNCM Clinical Goal(s):  Patient will verbalize basic understanding of  Abnormal heart rates, and s/s hyperglycemia  disease process and self health management plan as evidenced by keeping Zio patch on x 14 days as directed, checking BG 2 x day as directed  take all medications exactly as prescribed and will call provider for medication related questions as evidenced by medication compliance, refills as needed HR will remain within range  of 60-100 and BG will remain < 150  attend all scheduled medical appointments: as scheduled  as evidenced by no missed appointed , if change is needed appt is rescheduled within appropriate time frame   through collaboration with RN Care manager, provider, and care team. Seen by PCP 10/2 has appt 10/15 for labs    Interventions:  Inter-disciplinary care team collaboration (see longitudinal plan of care) Evaluation of current treatment plan related to  self management and patient's adherence to plan as established by provider, reviewed patient verbalized understanding    Tachyarrhythmias and Hyperglycemia  (Status:  New goal.)  Short Term Goal Evaluation of current treatment plan related to  New Medications and Medication dose adjustment  , self-management and patient's adherence to plan as established by provider. Discussed plans with patient for  ongoing care management follow up and provided patient with direct contact information for care management team, starting Wellbutrin, Zofran for Nausea, Monjauro  Reviewed medications with patient and discussed new medications and dose changes for current medications   Reviewed scheduled/upcoming provider appointments including PCP post discharge completed, has appt 2 weeks  for labs and Radiology 10/16 f/u, Cardio f/u 10/31  Assessed social determinant of health barriers- reviewed and none present   Patient Goals/Self-Care Activities: Take all medications as prescribed Attend all scheduled provider appointments Call pharmacy for medication refills 3-7 days in advance of running out of medications Call provider office for new concerns or questions   Follow Up Plan:  Telephone follow up appointment with care management team member scheduled for:  08/05/23 10:30 am           Plan: Telephone follow up appointment with care management team member scheduled for: The patient has been provided with contact information for the care management team and has been advised to call with any health related questions or concerns.  Susa Loffler , BSN, RN Care Management Coordinator [place]   [facility] [email or web address] Direct Dial: [phone]

## 2023-08-01 NOTE — Assessment & Plan Note (Signed)
New.  Thought to be related to her profound hypothyroidism.  No evidence of amyloid.  There was no 'good window' for pericardiocentesis so she was started on Aldactone instead.  Was asymptomatic by d/c and remains asymptomatic.  Has cardiology f/u upcoming.

## 2023-08-01 NOTE — Assessment & Plan Note (Signed)
Ongoing issue for pt.  Stressed need for compliance.  Pt has not yet started [facility] but plan is to pick this up and start weekly injxns.  Again, talked about the importance of taking care of herself.  Will follow closely.

## 2023-08-01 NOTE — Assessment & Plan Note (Signed)
Deteriorated.  Pt has been struggling w/ depression and anxiety for years but now admits that it is interfering w/ her ability to take care of herself.  She is embarrassed it got to this point.  Stressed that she doesn't need to be embarrassed but needs to use this as a wake up call to realize she deserves better.  Will start Wellbutrin 75mg  BID and monitor closely for improvement.  Pt expressed understanding and is in agreement w/ plan.

## 2023-08-01 NOTE — Assessment & Plan Note (Signed)
Ongoing issue for pt.  Has long history of noncompliance.  Talked about the need to take care of herself- take her medication, follow up as scheduled, treat her depression.  Pt is aware of all of these things.  She is currently on Levothyroxine daily.  Will monitor TSH closely- due at next appt.  Pt expressed understanding and is in agreement w/ plan.

## 2023-08-05 ENCOUNTER — Other Ambulatory Visit: Payer: 59

## 2023-08-05 ENCOUNTER — Telehealth: Payer: Self-pay

## 2023-08-05 NOTE — Patient Outreach (Signed)
  Care Management  Transitions of Care Program Transitions of Care Post-discharge week 3  08/05/2023 Name: Joanne Harris MRN: [id number] DOB: [birth year]  Subjective: Joanne Harris is a 41 y.o. year old female who is a primary care patient of Beverely Low, Helane Rima, MD. The Care Management team was unable to reach the patient by phone to assess and address transitions of care needs.   Plan: Additional outreach attempts will be made to reach the patient enrolled in the [facility] Program (Post Inpatient/ED Visit).   Susa Loffler , BSN, RN Care Management Coordinator [place]   [facility] [email or web address] Direct Dial: [phone]

## 2023-08-06 ENCOUNTER — Telehealth: Payer: Self-pay

## 2023-08-06 NOTE — Patient Outreach (Signed)
  Care Management  Transitions of Care Program Transitions of Care Post-discharge week 2  08/06/2023 Name: Joanne Harris MRN: [id number] DOB: 05/24/1981  Subjective: Joanne Harris is a 42 y.o. year old female who is a primary care patient of Beverely Low, Helane Rima, MD. The Care Management team was unable to reach the patient by phone to assess and address transitions of care needs.   Plan: Additional outreach attempts will be made to reach the patient enrolled in the [facility] Program (Post Inpatient/ED Visit).  Susa Loffler , BSN, RN Care Management Coordinator [place]   [facility] [email or web address] Direct Dial: [phone]

## 2023-08-09 ENCOUNTER — Telehealth: Payer: Self-pay

## 2023-08-09 NOTE — Patient Outreach (Signed)
  Care Management  Transitions of Care Program Transitions of Care Post-discharge week 2  08/09/2023 Name: Joanne Harris MRN: [id number] DOB: 10-18-1981  Subjective: Joanne Harris is a 41 y.o. year old female who is a primary care patient of Beverely Low, Helane Rima, MD. The Care Management team was unable to reach the patient by phone to assess and address transitions of care needs.   Plan: No further outreach attempts will be made at this time.  We have been unable to reach the patient.despite 3 separate attempts    Goals Addressed             This Visit's Progress    COMPLETED: TOC Care plan       Current Barriers:  Knowledge Deficits related to plan of care for management of Tachyarrhythmia,Hyperglycemia    RNCM Clinical Goal(s):  Patient will verbalize basic understanding of  Abnormal heart rates, and s/s hyperglycemia  disease process and self health management plan as evidenced by keeping Zio patch on x 14 days as directed, checking BG 2 x day as directed  take all medications exactly as prescribed and will call provider for medication related questions as evidenced by medication compliance, refills as needed HR will remain within range  of 60-100 and BG will remain < 150  attend all scheduled medical appointments: as scheduled  as evidenced by no missed appointed , if change is needed appt is rescheduled within appropriate time frame   through collaboration with RN Care manager, provider, and care team. Seen by PCP 10/2 has appt 10/15 for labs    Interventions:  Inter-disciplinary care team collaboration (see longitudinal plan of care) Evaluation of current treatment plan related to  self management and patient's adherence to plan as established by provider, reviewed patient verbalized understanding    Tachyarrhythmias and Hyperglycemia  (Status:   Care Plan closed -unable to reach patient despite 3 attempts  )  Short Term Goal Evaluation of current treatment plan  related to  New Medications and Medication dose adjustment  , self-management and patient's adherence to plan as established by provider. Discussed plans with patient for ongoing care management follow up and provided patient with direct contact information for care management team, starting Wellbutrin, Zofran for Nausea, Monjauro  Reviewed medications with patient and discussed new medications and dose changes for current medications  Reviewed scheduled/upcoming provider appointments including PCP post discharge completed, has appt 2 weeks  for labs and Radiology 10/16 f/u, Cardio f/u 10/31  Assessed social determinant of health barriers- reviewed and none present   Patient Goals/Self-Care Activities: Take all medications as prescribed Attend all scheduled provider appointments Call pharmacy for medication refills 3-7 days in advance of running out of medications Call provider office for new concerns or questions   Follow Up Plan:  We have been unable to make contact with the patient for follow up. The care management team is available to follow up with the patient after provider conversation with the patient regarding recommendation for care management engagement and subsequent re-referral to the care management team.          Susa Loffler , BSN, RN Care Management Coordinator [facility]   [facility] [email or web address] Direct Dial: [phone]

## 2023-08-10 NOTE — Progress Notes (Unsigned)
Cardiology Office Note:  .   Date:  08/11/2023  ID:  Joanne Harris, DOB 29-Aug-1981, MRN [id number] PCP: [name], MD  [facility] Providers Cardiologist:  Reatha Harps, MD    History of Present Illness: Joanne Harris is a 42 y.o. female with history of VT, pericardial effusion, hypothyroidism who presents for follow-up.   Discussed the use of AI scribe software for clinical note transcription with the patient, who gave verbal consent to proceed.  History of Present Illness   The patient, with a history of diabetes, hypothyroidism, and recent hospitalization for ventricular tachycardia and pericardial effusion, presents for follow-up. She reports some episodes of rapid heartbeat, particularly upon returning to work, but this has since subsided. She denies chest pain or dyspnea. She notes that her blood pressure was elevated at the visit, attributing this to nervousness and getting lost on the way to the clinic. No signs of tamponade. No CP or  SOB.   She also reports working on managing her diabetes.  She expresses a preference to avoid repeating the MRI if possible. She also reports some low energy, which she attributes to her current medications, metoprolol and spironolactone.          Problem List Ventricular tachycardia -2/2 severe hypothyroidism  -normal LHC 06/2023 2. Pericardial Effusion 2/2 hypothyroidism 06/2023 3. DM -A1c 8.6 4. HLD -T chol 267, HDL 63, LDL 155, TG 242, Lpa 168    ROS: All other ROS reviewed and negative. Pertinent positives noted in the HPI.     Studies Reviewed: Marland Kitchen   EKG Interpretation Date/Time:  Wednesday August 11 2023 09:45:47 EDT Ventricular Rate:  82 PR Interval:  166 QRS Duration:  90 QT Interval:  386 QTC Calculation: 450 R Axis:   85  Text Interpretation: Normal sinus rhythm Low voltage QRS Confirmed by Lennie Odor [phone]) on 08/11/2023 9:56:15 AM   Physical Exam:   VS:  BP (!) 142/100 (BP  Location: Right Arm, Patient Position: Sitting, Cuff Size: Large)   Pulse 82   Ht 5\' 4"  (1.626 m)   Wt (!) 309 lb (140.2 kg)   SpO2 93%   BMI 53.04 kg/m    Wt Readings from Last 3 Encounters:  08/11/23 (!) 309 lb (140.2 kg)  07/28/23 (!) 310 lb 12.8 oz (141 kg)  07/19/23 (!) 319 lb (144.7 kg)    GEN: Well nourished, well developed in no acute distress NECK: No JVD; No carotid bruits CARDIAC: RRR, no murmurs, rubs, gallops RESPIRATORY:  Clear to auscultation without rales, wheezing or rhonchi  ABDOMEN: Soft, non-tender, non-distended EXTREMITIES:  No edema; No deformity  ASSESSMENT AND PLAN: .   Assessment and Plan    Pericardial Effusion Likely secondary to severe hypothyroidism. No evidence of tamponade. Patient reports feeling well. -Order limited echo to evaluate effusion.  Ventricular Tachycardia Likely secondary to severe hypothyroidism. Patient reports low energy possibly due to Metoprolol. -Continue Metoprolol succinate 25mg  daily, but shift to nighttime dosing to mitigate low energy side effects.  Hypertension Elevated blood pressure noted during visit, possibly due to anxiety. -Advise patient to monitor blood pressure at home.  Hyperlipidemia Elevated LDL and LP(a) noted. Patient is diabetic and wishes to attempt conservative management before starting medication. -Re-evaluate lipid levels in 3 months or sooner if primary care physician rechecks labs.  Hypothyroidism Severe hypothyroidism being managed with Synthroid. Labs to be rechecked with primary care physician. -Continue current management.  Diabetes Patient is working on Clinical cytogeneticist  diabetes, currently on Mounjaro. -Continue current management.  Follow-up in 3 months.              Follow-up: Return in about 3 months (around 11/11/2023).  Time Spent with Patient: I have spent a total of 35 minutes with patient reviewing hospital notes, telemetry, EKGs, labs and examining the patient as well as  establishing an assessment and plan that was discussed with the patient.  > 50% of time was spent in direct patient care.  Signed, Lenna Gilford. Flora Lipps, MD, [facility]  [facility]  [address] [place], [place] [address] [phone]  10:08 AM

## 2023-08-11 ENCOUNTER — Ambulatory Visit: Payer: 59 | Admitting: Family Medicine

## 2023-08-11 ENCOUNTER — Encounter: Payer: Self-pay | Admitting: Cardiovascular Disease

## 2023-08-11 ENCOUNTER — Ambulatory Visit: Payer: 59 | Attending: Cardiovascular Disease | Admitting: Cardiovascular Disease

## 2023-08-11 VITALS — BP 142/100 | HR 82 | Ht 64.0 in | Wt 309.0 lb

## 2023-08-11 DIAGNOSIS — E039 Hypothyroidism, unspecified: Secondary | ICD-10-CM | POA: Diagnosis not present

## 2023-08-11 DIAGNOSIS — I150 Renovascular hypertension: Secondary | ICD-10-CM

## 2023-08-11 DIAGNOSIS — E782 Mixed hyperlipidemia: Secondary | ICD-10-CM

## 2023-08-11 DIAGNOSIS — I32 Pericarditis in diseases classified elsewhere: Secondary | ICD-10-CM

## 2023-08-11 DIAGNOSIS — I472 Ventricular tachycardia, unspecified: Secondary | ICD-10-CM | POA: Diagnosis not present

## 2023-08-11 DIAGNOSIS — I3139 Other pericardial effusion (noninflammatory): Secondary | ICD-10-CM

## 2023-08-11 MED ORDER — METOPROLOL SUCCINATE ER 25 MG PO TB24: 25.0000 mg | ORAL_TABLET | Freq: Every evening | ORAL | Status: DC

## 2023-08-11 MED ORDER — SPIRONOLACTONE 25 MG PO TABS: 25.0000 mg | ORAL_TABLET | Freq: Every day | ORAL | 3 refills | Status: AC

## 2023-08-11 NOTE — Patient Instructions (Addendum)
Medication Instructions:  Your physician has recommended you make the following change in your medication:  CHANGE: Metoprolol succinate (Toprol-XL) 25 mg nightly  *If you need a refill on your cardiac medications before your next appointment, please call your pharmacy*   Lab Work: None  Testing/Procedures: Your physician has requested that you have an echocardiogram. Echocardiography is a painless test that uses sound waves to create images of your heart. It provides your doctor with information about the size and shape of your heart and how well your heart's chambers and valves are working. This procedure takes approximately one hour. There are no restrictions for this procedure. Please do NOT wear cologne, perfume, aftershave, or lotions (deodorant is allowed). Please arrive 15 minutes prior to your appointment time.   Follow-Up: At [facility], you and your health needs are our priority.  As part of our continuing mission to provide you with exceptional heart care, we have created designated Provider Care Teams.  These Care Teams include your primary Cardiologist (physician) and Advanced Practice Providers (APPs -  Physician Assistants and Nurse Practitioners) who all work together to provide you with the care you need, when you need it.    Your next appointment:   3 month(s)  Provider:   Reatha Harps, MD

## 2023-08-18 ENCOUNTER — Encounter: Payer: Self-pay | Admitting: Family Medicine

## 2023-08-18 ENCOUNTER — Ambulatory Visit: Payer: 59 | Admitting: Family Medicine

## 2023-08-18 ENCOUNTER — Telehealth: Payer: Self-pay | Admitting: Family Medicine

## 2023-08-18 VITALS — BP 128/78 | HR 87 | Temp 97.7°F | Ht 64.0 in | Wt 311.2 lb

## 2023-08-18 DIAGNOSIS — Z124 Encounter for screening for malignant neoplasm of cervix: Secondary | ICD-10-CM | POA: Diagnosis not present

## 2023-08-18 DIAGNOSIS — E890 Postprocedural hypothyroidism: Secondary | ICD-10-CM

## 2023-08-18 DIAGNOSIS — E1165 Type 2 diabetes mellitus with hyperglycemia: Secondary | ICD-10-CM

## 2023-08-18 DIAGNOSIS — F341 Dysthymic disorder: Secondary | ICD-10-CM | POA: Diagnosis not present

## 2023-08-18 LAB — MICROALBUMIN / CREATININE URINE RATIO
Creatinine,U: 229.4 mg/dL
Microalb Creat Ratio: 0.9 mg/g (ref 0.0–30.0)
Microalb, Ur: 2.1 mg/dL — ABNORMAL HIGH (ref 0.0–1.9)

## 2023-08-18 MED ORDER — BUPROPION HCL ER (XL) 150 MG PO TB24: 150.0000 mg | ORAL_TABLET | Freq: Every day | ORAL | 3 refills | Status: DC

## 2023-08-18 NOTE — Patient Instructions (Signed)
Follow up in 5 weeks to recheck sugar, mood, and thyroid We'll notify you of your lab results and make any changes if needed INCREASE the Wellbutrin to 150mg  daily (just one a day) Keep up the good work on taking your medication regularly- I'm so proud of you!!! Call and schedule your eye exam We'll call you to schedule your GYN appt Call with any questions or concerns Stay Safe!  Stay Healthy! Happy Fall!!!

## 2023-08-18 NOTE — Assessment & Plan Note (Signed)
Ongoing issue.  Pt has struggled w/ compliance in the past but reports she is now taking Levothyroxine daily.  Check labs.  Adjust meds prn

## 2023-08-18 NOTE — Progress Notes (Signed)
   Subjective:    Patient ID: Joanne Harris, female    DOB: [birth year], 42 y.o.   MRN: [id number]  HPI Depression/Anxiety- ongoing issue for pt.  At last visit we added Wellbutrin 75mg  BID to her Sertraline 100mg  daily.  Pt reports mood is better.  Is interested in increasing dose.  Hypothyroid- chronic problem, was restarted on Levothyroxine daily.  Pt reports taking thyroid medication daily.    DM- due for eye exam and microalbumin.  UTD on foot exam.     Review of Systems For ROS see HPI     Objective:   Physical Exam Vitals reviewed.  Constitutional:      General: She is not in acute distress.    Appearance: Normal appearance. She is well-developed. She is obese. She is not ill-appearing.  HENT:     Head: Normocephalic and atraumatic.  Eyes:     Conjunctiva/sclera: Conjunctivae normal.     Pupils: Pupils are equal, round, and reactive to light.  Neck:     Thyroid: No thyromegaly.  Cardiovascular:     Rate and Rhythm: Normal rate and regular rhythm.     Pulses: Normal pulses.     Heart sounds: Normal heart sounds. No murmur heard. Pulmonary:     Effort: Pulmonary effort is normal. No respiratory distress.     Breath sounds: Normal breath sounds.  Abdominal:     General: There is no distension.     Palpations: Abdomen is soft.     Tenderness: There is no abdominal tenderness.  Musculoskeletal:     Cervical back: Normal range of motion and neck supple.  Lymphadenopathy:     Cervical: No cervical adenopathy.  Skin:    General: Skin is warm and dry.  Neurological:     General: No focal deficit present.     Mental Status: She is alert and oriented to person, place, and time.  Psychiatric:        Mood and Affect: Mood normal.        Behavior: Behavior normal.        Thought Content: Thought content normal.           Assessment & Plan:

## 2023-08-18 NOTE — Assessment & Plan Note (Signed)
Encouraged pt to schedule eye exam.  Microalbumin was ordered today.  UTD on foot exam.  Too early to check A1C

## 2023-08-18 NOTE — Assessment & Plan Note (Signed)
Improved w/ addition of Wellbutrin.  Will increase to extended release 150mg  daily and continue to monitor.  Pt expressed understanding and is in agreement w/ plan.

## 2023-08-18 NOTE — Telephone Encounter (Signed)
Lab was not successful in getting any blood drawn. Can a future order be put in so she can have her lab done on Friday morning please.

## 2023-08-18 NOTE — Telephone Encounter (Signed)
Reordered for future

## 2023-08-19 NOTE — Addendum Note (Signed)
Encounter addended by: Flavia Shipper on: 08/19/2023 11:07 AM  Actions taken: Imaging Exam ended

## 2023-08-20 ENCOUNTER — Other Ambulatory Visit (INDEPENDENT_AMBULATORY_CARE_PROVIDER_SITE_OTHER): Payer: 59

## 2023-08-20 DIAGNOSIS — E890 Postprocedural hypothyroidism: Secondary | ICD-10-CM | POA: Diagnosis not present

## 2023-08-20 LAB — TSH: TSH: 82.3 u[IU]/mL — ABNORMAL HIGH (ref 0.35–5.50)

## 2023-08-20 MED ORDER — LEVOTHYROXINE SODIUM 125 MCG PO TABS: 125.0000 ug | ORAL_TABLET | Freq: Every day | ORAL | 1 refills | Status: DC

## 2023-08-20 NOTE — Addendum Note (Signed)
Addended by: Eldred Manges on: 08/20/2023 03:01 PM   Modules accepted: Orders

## 2023-08-25 ENCOUNTER — Ambulatory Visit: Payer: 59 | Attending: Cardiology | Admitting: Cardiology

## 2023-08-25 ENCOUNTER — Encounter: Payer: Self-pay | Admitting: Cardiology

## 2023-08-25 VITALS — BP 156/98 | HR 91 | Ht 64.0 in | Wt 300.0 lb

## 2023-08-25 DIAGNOSIS — I472 Ventricular tachycardia, unspecified: Secondary | ICD-10-CM | POA: Diagnosis not present

## 2023-08-25 DIAGNOSIS — I10 Essential (primary) hypertension: Secondary | ICD-10-CM | POA: Diagnosis not present

## 2023-08-25 NOTE — Patient Instructions (Signed)
Medication Instructions:  Your physician recommends that you continue on your current medications as directed. Please refer to the Current Medication list given to you today. *If you need a refill on your cardiac medications before your next appointment, please call your pharmacy*   Follow-Up: At [facility], you and your health needs are our priority.  As part of our continuing mission to provide you with exceptional heart care, we have created designated Provider Care Teams.  These Care Teams include your primary Cardiologist (physician) and Advanced Practice Providers (APPs -  Physician Assistants and Nurse Practitioners) who all work together to provide you with the care you need, when you need it.  We recommend signing up for the patient portal called "MyChart".  Sign up information is provided on this After Visit Summary.  MyChart is used to connect with patients for Virtual Visits (Telemedicine).  Patients are able to view lab/test results, encounter notes, upcoming appointments, etc.  Non-urgent messages can be sent to your provider as well.   To learn more about what you can do with MyChart, go to [email or web address].    Your next appointment:   6 month(s)  Provider:   Nobie Putnam, MD

## 2023-08-25 NOTE — Progress Notes (Signed)
[facility]iology Office Note:   Date:  08/25/2023  ID:  Joanne Harris, DOB 02/24/1981, MRN [id number]  Primary Cardiologist: Reatha Harps, MD Electrophysiologist: Nobie Putnam, MD      History of Present Illness:   Joanne Harris is a 42 y.o. female with h/o history of hypothyroidism related to prior thyroid ablation, obesity, DM2 who is seen today for post hospital follow up.    Patient presented to ED on 07/16/23 complaining of shortness of breath, chest pain, and palpitations. Joanne Harris was found to be in a wide complex tachycardia, and ultimately required electrical cardioversion to normal sinus rhythm. Joanne Harris did have some PVCs of the same morphology initially after cardioversion but was started on metoprolol with improvement. Joanne Harris does report feeling palpitations in the past but is unsure what her heart rates were at that time and previous episodes did not last as long.  Joanne Harris was found to be hypothyroid with evidence of myxedema, having been off her thyroid replacement therapy. Joanne Harris was treated for this and improved. Joanne Harris was discharged on 07/21/23. Joanne Harris wore a live cardiac monitor for 5 days after discharge which showed no sustained arrhythmias. Since discharge from hospital the patient reports doing relatively well since hospital discharge.  Joanne Harris reports feeling improved after starting on her thyroid replacement.  Joanne Harris denies any palpitations, dizziness, lightheadedness, presyncope since her hospital discharge.  No new or acute complaints.  Review of systems complete and found to be negative unless listed in HPI.   EP Information / Studies Reviewed:    EKG is not ordered today. EKG from 08/11/23 reviewed which showed normal sinus rhythm.  Live Cardiac Monitor 06/2023:  Total Patch Wear Time:  5 days and 1 hours (2024-10-02T19:49:51-0400 to 2024-09-26T22:07:00-0400)   Monitor 1 HR 72 - 113, average 79 bpm. No atrial fibrillation detected. Rare supraventricular ectopy. Rare  ventricular ectopy. No sustained arrhythmias. Symptom trigger episodes correspond to normal sinus rhythm.   Monitor 2 HR 66 - 143, average 75 bpm. 13 nonsustained VT (longest 5 beats) No atrial fibrillation detected. Rare supraventricular ectopy. Frequent ventricular ectopy, 5%. No sustained arrhythmias. Symptom trigger episodes correspond to PVCs.   RHC/LHC 07/19/23:  Conclusions: No angiographically significant coronary artery disease. Mildly to moderately elevated left heart and pulmonary artery pressures. Severely elevated right heart filling pressures. Normal Fick cardiac output/index. Hemodynamics with equivocal findings of cardiac tamponade (absent y-descent noted on RA pressure tracing, elevated right heart pressures not completely equalized with left heart pressures, and concordant LV/RV pressures).   Recommendations: Proceed with cardiac MRI. Favor deferring drainage of pericardial effusion at this time, given lack of symptoms and equivocal hemodynamic findings of tamponade physiology. Primary prevention of coronary artery disease.  Echo 07/17/23:  1. Limited echo windows- Definity contrast was not administered. Left  ventricular ejection fraction, by estimation, is 60 to 65%. The left  ventricle has normal function. The left ventricle has no regional wall  motion abnormalities. There is moderate  left ventricular hypertrophy. Left ventricular diastolic parameters are  consistent with Grade I diastolic dysfunction (impaired relaxation).   2. Poorly visualized, appears underfilled. RA and RV collapse noted. RVEF  is reduced. Right ventricular systolic function is moderately reduced. The  right ventricular size is small. There is normal pulmonary artery systolic  pressure. The estimated right   ventricular systolic pressure is 11.7 mmHg.   3. Cannot rule-out tamponade physiology. Large pericardial effusion. The  pericardial effusion is circumferential.   4. The mitral  valve is grossly normal. Trivial  mitral valve  regurgitation.   5. The aortic valve was not well visualized. Aortic valve regurgitation  is not visualized. No aortic stenosis is present.   6. IVC is poorly visualized, but does not appear dilated.   Cardiac MRI 07/20/23:  IMPRESSION: 1. Initial study on 07/20/23 was very poor quality. Joanne Harris was brought back for repeat study on 07/21/23. Issue on initial study was that EKG gating was done but scanner was having difficulty detecting QRS complex due to low voltage, leading to inaccurate gating. For second study, switched to pulse triggering using pulse oximeter with significant improvement in image quality   2. Large pericardial effusion measuring up to 4cm adjacent to LV lateral wall. No evidence of ventricular interdependence on real time imaging   3. Myocardial T2 values are diffusely elevated, suggesting significant myocardial edema. In addition, there is marked elevation of ECV (40%) and patchy midwall LGE   4. Small biventricular size with normal systolic function. Asymmetric LV hypertrophy measuring 14mm in basal septum (10mm in posterior wall), not meeting criteria for HCM (<43mm)   5. Suspect findings (elevated T1/T2/ECV, LGE, pericardial effusion) are due to severe hypothyroidism. ECV however is higher than typically expected, and is on the border of the expected range for amyloid. Early amyloid is on the differential given the LVH, ECV, and LGE findings, though suspect more likely hypothyroidism as cause. Recommend checking SPEP/UPEP/light chains and PYP scan to evaluate for cardiac amyloid, but if unremarkable suspect hypothyroidism as etiology of findings and would suggest repeating cardiac MRI once thyroid disease has been treated   Physical Exam:   VS:  BP (!) 156/98   Pulse 91   Ht 5\' 4"  (1.626 m)   Wt 300 lb (136.1 kg)   SpO2 99%   BMI 51.49 kg/m    Wt Readings from Last 3 Encounters:  08/25/23 300 lb (136.1 kg)   08/18/23 (!) 311 lb 4 oz (141.2 kg)  08/11/23 (!) 309 lb (140.2 kg)     GEN: Well nourished, well developed in no acute distress NECK: No JVD; No carotid bruits CARDIAC: Normal rate, regular rhythm. RESPIRATORY:  Clear to auscultation without rales, wheezing or rhonchi  ABDOMEN: Soft, non-tender, non-distended EXTREMITIES:  No edema; No deformity   ASSESSMENT AND PLAN:   Assessment: Joanne Harris is a 42 y.o. 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  There have been case reports of VT in females with hypothyroidism.  Initial echo was showed  normal left ventricle size and systolic function, but was notable for pericardial effusion which is likely secondary to her hypothyroid state.  Joanne Harris had cardiac MRI that was of relatively poor quality but did show some nonspecific findings suggestive of an infiltrative process, which could be secondary to amyloidosis versus hypothyroid related edema.  MRI study was then repeated with improved quality and appeared to be most consistent with edema secondary to her hypothyroid state rather than true infiltrative scar.   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 No coronary disease. No definitive scar on MRI, only significant myocardial edema. Amyloid workup was negative.  Joanne Harris wore a cardiac monitor for 5 days after hospital discharge and did not have any sustained arrhythmias. - Continue metoprolol for now.  Joanne Harris appears to be tolerating this medication without any side effects at this time.  We discussed potentially  stopping this medication if Joanne Harris has no recurrence of arrhythmia in 6 months.  #Hypertension Above goal today.  Recommend checking blood pressures 1-2 times per week at home and recording the values.  Recommend bringing these recordings to the primary  care physician.   Follow up with Dr. Jimmey Ralph in 6 months  Signed, Nobie Putnam, MD

## 2023-09-13 ENCOUNTER — Ambulatory Visit (HOSPITAL_COMMUNITY): Payer: 59 | Attending: Cardiovascular Disease

## 2023-09-13 DIAGNOSIS — I3139 Other pericardial effusion (noninflammatory): Secondary | ICD-10-CM | POA: Insufficient documentation

## 2023-09-13 LAB — ECHOCARDIOGRAM LIMITED
Area-P 1/2: 4.23 cm2
S' Lateral: 2.7 cm

## 2023-09-22 ENCOUNTER — Ambulatory Visit: Payer: 59 | Admitting: Family Medicine

## 2023-09-22 ENCOUNTER — Other Ambulatory Visit: Payer: 59

## 2023-09-22 ENCOUNTER — Telehealth: Payer: Self-pay

## 2023-09-22 ENCOUNTER — Other Ambulatory Visit: Payer: Self-pay

## 2023-09-22 ENCOUNTER — Encounter: Payer: Self-pay | Admitting: Family Medicine

## 2023-09-22 VITALS — BP 138/88 | HR 89 | Temp 97.8°F | Ht 64.0 in | Wt 298.0 lb

## 2023-09-22 DIAGNOSIS — E890 Postprocedural hypothyroidism: Secondary | ICD-10-CM | POA: Diagnosis not present

## 2023-09-22 DIAGNOSIS — Z7984 Long term (current) use of oral hypoglycemic drugs: Secondary | ICD-10-CM | POA: Diagnosis not present

## 2023-09-22 DIAGNOSIS — E1165 Type 2 diabetes mellitus with hyperglycemia: Secondary | ICD-10-CM

## 2023-09-22 LAB — LIPID PANEL
Cholesterol: 143 mg/dL (ref 0–200)
HDL: 36.1 mg/dL — ABNORMAL LOW (ref >39.00)
LDL Cholesterol: 81 mg/dL (ref 0–99)
NonHDL: 106.71
Total CHOL/HDL Ratio: 4
Triglycerides: 128 mg/dL (ref 0.0–149.0)
VLDL: 25.6 mg/dL (ref 0.0–40.0)

## 2023-09-22 LAB — BASIC METABOLIC PANEL
BUN: 14 mg/dL (ref 6–23)
CO2: 25 meq/L (ref 19–32)
Calcium: 9.2 mg/dL (ref 8.4–10.5)
Chloride: 101 meq/L (ref 96–112)
Creatinine, Ser: 0.79 mg/dL (ref 0.40–1.20)
GFR: 93.17 mL/min (ref >60.00)
Glucose, Bld: 150 mg/dL — ABNORMAL HIGH (ref 70–99)
Potassium: 3.6 meq/L (ref 3.5–5.1)
Sodium: 135 meq/L (ref 135–145)

## 2023-09-22 LAB — CBC WITH DIFFERENTIAL/PLATELET
Basophils Absolute: 0.1 10*3/uL (ref 0.0–0.1)
Basophils Relative: 0.6 % (ref 0.0–3.0)
Eosinophils Absolute: 0.2 10*3/uL (ref 0.0–0.7)
Eosinophils Relative: 2.1 % (ref 0.0–5.0)
HCT: 38.7 % (ref 36.0–46.0)
Hemoglobin: 12.8 g/dL (ref 12.0–15.0)
Lymphocytes Relative: 22.3 % (ref 12.0–46.0)
Lymphs Abs: 2.1 10*3/uL (ref 0.7–4.0)
MCHC: 33 g/dL (ref 30.0–36.0)
MCV: 98.9 fL (ref 78.0–100.0)
Monocytes Absolute: 0.4 10*3/uL (ref 0.1–1.0)
Monocytes Relative: 4.2 % (ref 3.0–12.0)
Neutro Abs: 6.8 10*3/uL (ref 1.4–7.7)
Neutrophils Relative %: 70.8 % (ref 43.0–77.0)
Platelets: 511 10*3/uL — ABNORMAL HIGH (ref 150.0–400.0)
RBC: 3.91 Mil/uL (ref 3.87–5.11)
RDW: 12.4 % (ref 11.5–15.5)
WBC: 9.5 10*3/uL (ref 4.0–10.5)

## 2023-09-22 LAB — HEPATIC FUNCTION PANEL
ALT: 25 U/L (ref 0–35)
AST: 20 U/L (ref 0–37)
Albumin: 4.2 g/dL (ref 3.5–5.2)
Alkaline Phosphatase: 68 U/L (ref 39–117)
Bilirubin, Direct: 0.1 mg/dL (ref 0.0–0.3)
Total Bilirubin: 0.4 mg/dL (ref 0.2–1.2)
Total Protein: 7.5 g/dL (ref 6.0–8.3)

## 2023-09-22 LAB — TSH: TSH: 10.67 u[IU]/mL — ABNORMAL HIGH (ref 0.35–5.50)

## 2023-09-22 MED ORDER — LEVOTHYROXINE SODIUM 137 MCG PO TABS: 137.0000 ug | ORAL_TABLET | Freq: Every day | ORAL | 3 refills | Status: DC

## 2023-09-22 NOTE — Patient Instructions (Addendum)
Follow up in 3-4 months to recheck sugar We'll notify you of your lab results and make any changes if needed Keep up the good work on healthy diet, regular walking, and taking your medication- you're doing great!! Call with any questions or concerns Stay Safe!  Stay Healthy! Happy Holidays!!!

## 2023-09-22 NOTE — Telephone Encounter (Signed)
-----   Message from Neena Rhymes sent at 09/22/2023  4:32 PM EST ----- BOOM!  That cholesterol is AMAZING!!!  Your TSH has improved from 82 --> 10.67!!!  Incredible!  Since it is still just a bit outside of range, we're going to increase your Levothyroxine to daily (#30, 3 refills) and repeat your TSH at a lab only visit in 1 month (TSH, dx hypothyroid)  Still waiting on your A1C but everything else looks great!

## 2023-09-22 NOTE — Assessment & Plan Note (Signed)
Ongoing issue for pt.  Current BMI 51.15.  She is now walking 3x/week- applauded her efforts.  Encouraged her to consider adding an additional day after the holidays.  Will continue to follow.

## 2023-09-22 NOTE — Assessment & Plan Note (Signed)
Chronic problem.  Pt states she is taking her medication daily after her hospitalization earlier this fall.  Still has a large pericardial effusion noted on ECHO last week.  Their thought is that as her thyroid levels improve, this will resolve.  Check labs.  Adjust meds prn

## 2023-09-22 NOTE — Assessment & Plan Note (Signed)
Ongoing issue for pt.  Currently on Mounjaro 2.5mg  weekly.  She is open to idea of increasing dose but wants to see what A1C is prior to making changes.  Encouraged low carb diet, regular exercise.  UTD on foot exam, microalbumin.  Eye exam is scheduled for January.  Check labs.  Adjust meds prn

## 2023-09-22 NOTE — Telephone Encounter (Signed)
Attempted to call patient regarding lab results and was unable to leave a vm. I am reaching out via mychart.

## 2023-09-22 NOTE — Progress Notes (Signed)
   Subjective:    Patient ID: Joanne Harris, female    DOB: 1980/11/20, 42 y.o.   MRN: [id number]  HPI DM- chronic problem, on Mounjaro 2.5mg  weekly.  UTD on foot exam, microalbumin.  Due for eye exam- appt schedule for January.  No reported side effects.  No CP.  Some SOB upon waking but resolves quickly and she does not experience this as the day goes on.  No HA's, visual changes.  No abd pain.  Mild nausea after injxn.  No numbness/tingling of hands/feet.  Hypothyroid- chronic problem, on Levothyroxine daily.  Last TSH 82.  Taking medication regularly.  Energy level is improving.    Obesity- ongoing issue for pt.  Current BMI 51.15  Is walking more regularly- 3x/week.     Review of Systems For ROS see HPI     Objective:   Physical Exam Vitals reviewed.  Constitutional:      General: She is not in acute distress.    Appearance: Normal appearance. She is well-developed. She is obese. She is not ill-appearing.  HENT:     Head: Normocephalic and atraumatic.  Eyes:     Conjunctiva/sclera: Conjunctivae normal.     Pupils: Pupils are equal, round, and reactive to light.     Comments: Exophthalmus  Neck:     Thyroid: No thyromegaly.  Cardiovascular:     Rate and Rhythm: Normal rate and regular rhythm.     Heart sounds: Normal heart sounds. No murmur heard. Pulmonary:     Effort: Pulmonary effort is normal. No respiratory distress.     Breath sounds: Normal breath sounds.  Abdominal:     General: There is no distension.     Palpations: Abdomen is soft.     Tenderness: There is no abdominal tenderness.  Musculoskeletal:     Cervical back: Normal range of motion and neck supple.  Lymphadenopathy:     Cervical: No cervical adenopathy.  Skin:    General: Skin is warm and dry.  Neurological:     General: No focal deficit present.     Mental Status: She is alert and oriented to person, place, and time.  Psychiatric:        Mood and Affect: Mood normal.         Behavior: Behavior normal.           Assessment & Plan:

## 2023-09-23 LAB — HEMOGLOBIN A1C
Hgb A1c MFr Bld: 5.6 %{Hb} (ref <5.7)
Mean Plasma Glucose: 114 mg/dL
eAG (mmol/L): 6.3 mmol/L

## 2023-09-27 ENCOUNTER — Telehealth: Payer: Self-pay

## 2023-09-27 NOTE — Telephone Encounter (Signed)
-----   Message from Neena Rhymes sent at 09/25/2023  7:10 PM EST ----- BOOM!  Look at that A1C!!!  It's FANTASTIC!  Keep up the good work!

## 2023-09-28 NOTE — Telephone Encounter (Signed)
Call can not be completed 2 x . Letter out.

## 2023-10-05 ENCOUNTER — Ambulatory Visit: Payer: 59 | Admitting: Family Medicine

## 2023-10-08 ENCOUNTER — Ambulatory Visit (INDEPENDENT_AMBULATORY_CARE_PROVIDER_SITE_OTHER): Payer: 59 | Admitting: Family Medicine

## 2023-10-08 VITALS — BP 124/78 | HR 58 | Ht 64.0 in | Wt 238.0 lb

## 2023-10-08 DIAGNOSIS — E890 Postprocedural hypothyroidism: Secondary | ICD-10-CM | POA: Diagnosis not present

## 2023-10-08 LAB — TSH: TSH: 16.64 u[IU]/mL — ABNORMAL HIGH (ref 0.35–5.50)

## 2023-10-08 LAB — T4, FREE: Free T4: 0.72 ng/dL (ref 0.60–1.60)

## 2023-10-08 LAB — T3, FREE: T3, Free: 2.6 pg/mL (ref 2.3–4.2)

## 2023-10-08 NOTE — Progress Notes (Signed)
   Subjective:    Patient ID: Joanne Harris, female    DOB: [birth year], 42 y.o.   MRN: [id number]  HPI Hypothyroid- last visit TSH had improved from 82 --> 10.67  We increased Levothyroxine to daily.  She reports tolerating the dose w/o difficulty, 'i can't really tell a difference'.  Pt reports some days her breathing is better but some days still has some SOB.  Pt reports energy level is good.   Review of Systems For ROS see HPI     Objective:   Physical Exam Vitals reviewed.  Constitutional:      General: She is not in acute distress.    Appearance: Normal appearance. She is obese. She is not ill-appearing.  HENT:     Head: Normocephalic and atraumatic.  Eyes:     Extraocular Movements: Extraocular movements intact.     Conjunctiva/sclera: Conjunctivae normal.     Comments: exophthalmos  Cardiovascular:     Rate and Rhythm: Normal rate and regular rhythm.     Heart sounds: Normal heart sounds. No murmur heard.    No friction rub.  Pulmonary:     Effort: Pulmonary effort is normal. No respiratory distress.     Breath sounds: Normal breath sounds. No wheezing or rhonchi.  Neurological:     General: No focal deficit present.     Mental Status: She is alert and oriented to person, place, and time.  Psychiatric:        Mood and Affect: Mood normal.        Behavior: Behavior normal.        Thought Content: Thought content normal.           Assessment & Plan:

## 2023-10-08 NOTE — Patient Instructions (Signed)
Follow up as needed or as scheduled We'll notify you of your lab results and make any changes if needed Keep up the good work on healthy diet and regular exercise- you can do it!! Call with any questions or concerns Stay Safe!  Stay Healthy! Happy Holidays!!!

## 2023-10-08 NOTE — Assessment & Plan Note (Signed)
Ongoing issue for pt.  Historically has been difficult to treat due to medication noncompliance but since her hospitalization, she has been taking her medication diligently.  Will repeat TSH today and adjust meds if needed.  Pt expressed understanding and is in agreement w/ plan.

## 2023-10-11 ENCOUNTER — Telehealth: Payer: Self-pay

## 2023-10-11 ENCOUNTER — Other Ambulatory Visit: Payer: Self-pay

## 2023-10-11 ENCOUNTER — Encounter: Payer: Self-pay | Admitting: Family Medicine

## 2023-10-11 DIAGNOSIS — E0590 Thyrotoxicosis, unspecified without thyrotoxic crisis or storm: Secondary | ICD-10-CM

## 2023-10-11 MED ORDER — LEVOTHYROXINE SODIUM 150 MCG PO TABS: 150.0000 ug | ORAL_TABLET | Freq: Every day | ORAL | 3 refills | Status: DC

## 2023-10-11 NOTE — Telephone Encounter (Signed)
-----   Message from Neena Rhymes sent at 10/11/2023  7:43 AM EST ----- Somehow your TSH has increased despite a higher dose of Levothyroxine.  We will increase your Levothyroxine to daily (#30, 3 refills) and repeat your TSH level at a lab only visit in 1 month (TSH, dx hypothyroid).  Please make sure you are taking your medication regularly.

## 2023-10-11 NOTE — Telephone Encounter (Signed)
See My chart message

## 2023-11-05 ENCOUNTER — Other Ambulatory Visit: Payer: 59

## 2023-11-07 NOTE — Progress Notes (Signed)
  Cardiology Office Note:  .   Date:  11/09/2023  ID:  Lila Charlies Molly, DOB 07-14-1981, MRN [id number] PCP: Mahlon Comer BRAVO, MD   [facility] Providers Cardiologist:  Darryle ONEIDA Decent, MD Electrophysiologist:  Fonda Kitty, MD    History of Present Illness: .   Joanne Harris is a 43 y.o. female with history of VT, pericardial effusion, hypothyroidism who presents for follow-up.   History of Present Illness   Miss Escutia, a 43 year old female with severe hypothyroidism, presents for follow-up of a large pericardial effusion. She reports overall improvement in her condition, with some residual shortness of breath in the mornings that improves as the day progresses. She has no chest pain or palpitations. She has experienced some nausea, which may be related to her medication, Mounjaro . She has lost weight and her diabetes, cholesterol, and blood pressure are well controlled. 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 She has no family history of heart disease.          Problem List Ventricular tachycardia -2/2 severe hypothyroidism  -normal LHC 06/2023 2. Pericardial Effusion 2/2 hypothyroidism 06/2023 3. DM -A1c 5.6 4. HLD -T chol 143, HDL 36, LDL 81, TG 128, Lpa 168    ROS: All other ROS reviewed and negative. Pertinent positives noted in the HPI.     Studies Reviewed: SABRA       Physical Exam:   VS:  BP (!) 136/96 (BP Location: Left Arm, Patient Position: Sitting, Cuff Size: Large)   Pulse 89   Ht 5' 4.5 (1.638 m)   Wt 298 lb (135.2 kg)   BMI 50.36 kg/m    Wt Readings from Last 3 Encounters:  11/09/23 298 lb (135.2 kg)  10/08/23 238 lb (108 kg)  09/22/23 298 lb (135.2 kg)    GEN: Well nourished, well developed in no acute distress NECK: No JVD; No carotid bruits CARDIAC: RRR, no murmurs, rubs, gallops RESPIRATORY:  Clear to auscultation without rales, wheezing or rhonchi  ABDOMEN: Soft, non-tender, non-distended EXTREMITIES:  No  edema; No deformity  ASSESSMENT AND PLAN: .   Assessment and Plan    Pericardial Effusion in the setting of Severe Hypothyroidism Large pericardial effusion still present on recent echo, but thyroid  levels are improving. Minimal symptoms of shortness of breath in the morning that improve as the day progresses. -Repeat echocardiogram to reassess pericardial effusion. -Continue thyroid  treatment and monitor TSH levels.  Ventricular Tachycardia No recurrence on Metoprolol  Succinate 25mg  daily. Unclear if long-term use is necessary. -Discuss long-term use with electrophysiology.  Hyperlipidemia LDL cholesterol at goal on Lipitor. -Continue Lipitor.  Diabetes Well controlled. -Continue current management.  Hypertension Well controlled on current regimen. -Continue current regimen.  Obesity BMI 50, but patient is losing weight. -Encourage continued weight loss.  Follow-up in 6 months, but will be in touch after echocardiogram results.              Follow-up: Return in about 6 months (around 05/08/2024).  Signed, Darryle ONEIDA. Decent, MD, [facility]  [facility]  [address] [place], [place] [address] [phone]  9:21 AM

## 2023-11-08 ENCOUNTER — Other Ambulatory Visit: Payer: Self-pay

## 2023-11-08 ENCOUNTER — Telehealth: Payer: Self-pay

## 2023-11-08 ENCOUNTER — Other Ambulatory Visit (INDEPENDENT_AMBULATORY_CARE_PROVIDER_SITE_OTHER): Payer: 59

## 2023-11-08 DIAGNOSIS — E0590 Thyrotoxicosis, unspecified without thyrotoxic crisis or storm: Secondary | ICD-10-CM

## 2023-11-08 LAB — TSH: TSH: 13.29 u[IU]/mL — ABNORMAL HIGH (ref 0.35–5.50)

## 2023-11-08 MED ORDER — LEVOTHYROXINE SODIUM 175 MCG PO TABS: 175.0000 ug | ORAL_TABLET | Freq: Every day | ORAL | 3 refills | Status: DC

## 2023-11-08 NOTE — Telephone Encounter (Signed)
-----   Message from Comer Greet sent at 11/08/2023  4:20 PM EST ----- Your TSH continues to get closer to normal.  This is great news!  We will increase your Levothyroxine  to 175mcg daily (#30, 3 refills) and repeat your TSH level at a lab only visit in 1 month (TSH, dx hypothyroid)

## 2023-11-09 ENCOUNTER — Encounter: Payer: Self-pay | Admitting: Cardiovascular Disease

## 2023-11-09 ENCOUNTER — Ambulatory Visit: Payer: 59 | Attending: Cardiovascular Disease | Admitting: Cardiovascular Disease

## 2023-11-09 VITALS — BP 136/96 | HR 89 | Ht 64.5 in | Wt 298.0 lb

## 2023-11-09 DIAGNOSIS — I472 Ventricular tachycardia, unspecified: Secondary | ICD-10-CM

## 2023-11-09 DIAGNOSIS — I150 Renovascular hypertension: Secondary | ICD-10-CM

## 2023-11-09 DIAGNOSIS — I10 Essential (primary) hypertension: Secondary | ICD-10-CM

## 2023-11-09 DIAGNOSIS — E782 Mixed hyperlipidemia: Secondary | ICD-10-CM

## 2023-11-09 DIAGNOSIS — I3139 Other pericardial effusion (noninflammatory): Secondary | ICD-10-CM

## 2023-11-09 NOTE — Telephone Encounter (Signed)
 Patient viewed results via MyChart so I sent a follow up message requesting she make a lab appointment and asking if she has any questions

## 2023-11-09 NOTE — Patient Instructions (Addendum)
 Medication Instructions:  Your physician recommends that you continue on your current medications as directed. Please refer to the Current Medication list given to you today.    *If you need a refill on your cardiac medications before your next appointment, please call your pharmacy*   Lab Work: NONE    If you have labs (blood work) drawn today and your tests are completely normal, you will receive your results only by: MyChart Message (if you have MyChart) OR A paper copy in the mail If you have any lab test that is abnormal or we need to change your treatment, we will call you to review the results.   Testing/Procedures: Echo will be scheduled at [address].  Your physician has requested that you have an echocardiogram. Echocardiography is a painless test that uses sound waves to create images of your heart. It provides your doctor with information about the size and shape of your heart and how well your heart's chambers and valves are working. This procedure takes approximately one hour. There are no restrictions for this procedure. Please do NOT wear cologne, perfume, aftershave, or lotions (deodorant is allowed). Please arrive 15 minutes prior to your appointment time.    Follow-Up: At [facility], you and your health needs are our priority.  As part of our continuing mission to provide you with exceptional heart care, we have created designated Provider Care Teams.  These Care Teams include your primary Cardiologist (physician) and Advanced Practice Providers (APPs -  Physician Assistants and Nurse Practitioners) who all work together to provide you with the care you need, when you need it.  We recommend signing up for the patient portal called MyChart.  Sign up information is provided on this After Visit Summary.  MyChart is used to connect with patients for Virtual Visits (Telemedicine).  Patients are able to view lab/test results, encounter notes, upcoming  appointments, etc.  Non-urgent messages can be sent to your provider as well.   To learn more about what you can do with MyChart, go to [email or web address].    Your next appointment:   6 month(s)  The format for your next appointment:   In Person  Provider:   Darryle ONEIDA Decent, MD    Other Instructions

## 2023-11-10 NOTE — Telephone Encounter (Signed)
 Called again to see about scheduling lab appt, no answer, will try again later

## 2023-11-11 NOTE — Telephone Encounter (Signed)
Unable to contact letter sent

## 2023-12-01 ENCOUNTER — Ambulatory Visit (HOSPITAL_COMMUNITY): Payer: 59 | Attending: Internal Medicine

## 2023-12-01 DIAGNOSIS — I3139 Other pericardial effusion (noninflammatory): Secondary | ICD-10-CM | POA: Diagnosis present

## 2023-12-01 LAB — ECHOCARDIOGRAM COMPLETE
Area-P 1/2: 5.13 cm2
S' Lateral: 2.7 cm

## 2023-12-02 ENCOUNTER — Encounter: Payer: Self-pay | Admitting: Cardiovascular Disease

## 2023-12-23 ENCOUNTER — Ambulatory Visit: Payer: 59 | Admitting: Family Medicine

## 2023-12-28 ENCOUNTER — Ambulatory Visit: Payer: 59 | Admitting: Family Medicine

## 2024-01-06 ENCOUNTER — Ambulatory Visit: Admitting: Family Medicine

## 2024-01-14 ENCOUNTER — Other Ambulatory Visit

## 2024-01-14 ENCOUNTER — Encounter: Payer: Self-pay | Admitting: Family Medicine

## 2024-01-14 ENCOUNTER — Ambulatory Visit: Admitting: Family Medicine

## 2024-01-14 VITALS — BP 128/82 | HR 95 | Temp 98.1°F | Ht 64.5 in | Wt 276.0 lb

## 2024-01-14 DIAGNOSIS — E1165 Type 2 diabetes mellitus with hyperglycemia: Secondary | ICD-10-CM

## 2024-01-14 DIAGNOSIS — Z7985 Long-term (current) use of injectable non-insulin antidiabetic drugs: Secondary | ICD-10-CM | POA: Diagnosis not present

## 2024-01-14 DIAGNOSIS — Z6841 Body Mass Index (BMI) 40.0 and over, adult: Secondary | ICD-10-CM | POA: Diagnosis not present

## 2024-01-14 DIAGNOSIS — E890 Postprocedural hypothyroidism: Secondary | ICD-10-CM

## 2024-01-14 LAB — BASIC METABOLIC PANEL
BUN: 10 mg/dL (ref 6–23)
CO2: 26 meq/L (ref 19–32)
Calcium: 9.2 mg/dL (ref 8.4–10.5)
Chloride: 98 meq/L (ref 96–112)
Creatinine, Ser: 0.65 mg/dL (ref 0.40–1.20)
GFR: 109.43 mL/min (ref >60.00)
Glucose, Bld: 334 mg/dL — ABNORMAL HIGH (ref 70–99)
Potassium: 4.2 meq/L (ref 3.5–5.1)
Sodium: 133 meq/L — ABNORMAL LOW (ref 135–145)

## 2024-01-14 LAB — TSH: TSH: 0.49 u[IU]/mL (ref 0.35–5.50)

## 2024-01-14 MED ORDER — TIRZEPATIDE 5 MG/0.5ML ~~LOC~~ SOAJ: 5.0000 mg | SUBCUTANEOUS | 1 refills | Status: DC

## 2024-01-14 NOTE — Assessment & Plan Note (Signed)
 Chronic problem.  TSH has been difficult to manage in the past, but some of that was noncompliance.  Currently asymptomatic. Check labs.  Adjust meds prn

## 2024-01-14 NOTE — Assessment & Plan Note (Signed)
 Chronic problem.  On Mounjaro 2.5mg  weekly.  She is currently asymptomatic but has had loose stools after eating for years (since having gallbladder removed).  Will increase dose of Mounjaro in hopes of slowing bowels as well as helping w/ weight loss and sugar.  Pt expressed understanding and is in agreement w/ plan.

## 2024-01-14 NOTE — Patient Instructions (Signed)
 Follow up in 3-4 months to recheck sugar, blood pressure, cholesterol We'll notify you of your lab results and make any changes if needed Continue to work on healthy diet and regular exercise- you can do it! Call with any questions or concerns Stay Safe!  Stay Healthy! Happy Spring!!!

## 2024-01-14 NOTE — Assessment & Plan Note (Signed)
 She is down 22 lbs since January!  Applauded her efforts and encouraged her to continue working on healthy diet and regular exercise.

## 2024-01-14 NOTE — Progress Notes (Signed)
   Subjective:    Patient ID: Joanne Harris, female    DOB: Nov 20, 1980, 43 y.o.   MRN: [id number]  HPI DM- chronic problem, on Mounjaro 2.5mg  weekly.  No CP, SOB above baseline, HA's, visual changes.  No abd pain but is having loose stools w/ urgency after eating.  Denies symptomatic lows.  No numbness/tingling.  Hypothyroid- chronic problem, on Levothyroxine daily.  Hair seems to be getting thicker, no changes to skin or nails.    Obesity- pt is down 22 lbs since January!  BMI now 46.64   Review of Systems For ROS see HPI     Objective:   Physical Exam Vitals reviewed.  Constitutional:      General: She is not in acute distress.    Appearance: Normal appearance. She is well-developed. She is obese. She is not ill-appearing.  HENT:     Head: Normocephalic and atraumatic.  Eyes:     Conjunctiva/sclera: Conjunctivae normal.     Pupils: Pupils are equal, round, and reactive to light.  Neck:     Thyroid: No thyromegaly.  Cardiovascular:     Rate and Rhythm: Normal rate and regular rhythm.     Pulses: Normal pulses.     Heart sounds: Normal heart sounds. No murmur heard. Pulmonary:     Effort: Pulmonary effort is normal. No respiratory distress.     Breath sounds: Normal breath sounds.  Abdominal:     General: There is no distension.     Palpations: Abdomen is soft.     Tenderness: There is no abdominal tenderness.  Musculoskeletal:     Cervical back: Normal range of motion and neck supple.     Right lower leg: No edema.     Left lower leg: No edema.  Lymphadenopathy:     Cervical: No cervical adenopathy.  Skin:    General: Skin is warm and dry.  Neurological:     General: No focal deficit present.     Mental Status: She is alert and oriented to person, place, and time.  Psychiatric:        Mood and Affect: Mood normal.        Behavior: Behavior normal.           Assessment & Plan:

## 2024-01-15 ENCOUNTER — Encounter: Payer: Self-pay | Admitting: Family Medicine

## 2024-01-15 LAB — HEMOGLOBIN A1C
Est. average glucose Bld gHb Est-mCnc: 180 mg/dL
Hgb A1c MFr Bld: 7.9 % — ABNORMAL HIGH (ref 4.8–5.6)

## 2024-01-17 ENCOUNTER — Telehealth: Payer: Self-pay

## 2024-01-17 NOTE — Telephone Encounter (Addendum)
-----   Message from Neena Rhymes sent at 01/15/2024  5:13 PM EDT ----- TSH looks great!  Sugar is quite high- please be working on healthy diet and regular exercise  A1C has increased from 5.6 --> 7.9%  Still MUCH better than 6 months ago, but continue to work on low carb, low sugar diet and regular exercise

## 2024-01-19 NOTE — Telephone Encounter (Signed)
 Called patient to relay lab results, Was not able to leave VM.

## 2024-02-23 ENCOUNTER — Other Ambulatory Visit: Payer: Self-pay | Admitting: Family Medicine

## 2024-03-03 ENCOUNTER — Encounter: Admitting: Obstetrics and Gynecology

## 2024-03-30 ENCOUNTER — Ambulatory Visit: Admitting: Cardiology

## 2024-04-03 ENCOUNTER — Encounter: Admitting: Obstetrics and Gynecology

## 2024-04-06 ENCOUNTER — Other Ambulatory Visit: Payer: Self-pay | Admitting: Family Medicine

## 2024-04-10 NOTE — Progress Notes (Unsigned)
 [facility]rophys[facility] Office Note:   Date:  04/12/2024  ID:  Joanne Harris, DOB Apr 07, 1981, MRN [id number]  Primary Cardiologist: Oneil Bigness, MD Electrophysiologist: Ardeen Kohler, MD      History of Present Illness:   Joanne Harris is a 43 y.o. female with h/o history of hypothyroidism related to prior thyroid  ablation, obesity, DM2 who is seen today for follow up of ventricular tachycardia.    Patient presented to ED on 07/16/23 complaining of shortness of breath, chest pain, and palpitations. She was found to be in a wide complex tachycardia, and ultimately required electrical cardioversion to normal sinus rhythm. She did have some PVCs of the same morphology initially after cardioversion. She was found to be hypothyroid with evidence of myxedema, having been off her thyroid  replacement therapy. She was treated for this and improved.  This also appeared to lead to reduction in her arrhythmia burden.  She was discharged on 07/21/23. She wore a live cardiac monitor for 5 days after discharge which showed no sustained arrhythmias.   Discussed the use of AI scribe software for clinical note transcription with the patient, who gave verbal consent to proceed.  History of Present Illness Since our last visit, her thyroid  levels have stabilized after a period of titration. Her thyroid  issues were previously thought to have contributed to her heart rhythm disturbances. She experiences rare episodes of heart racing, though she is uncertain if these are real or perceived. Her heart monitor sometimes shows a heart rate of 80-90 bpm during these episodes. These episodes last for about a minute or two and typically occur while she is working. No associated pain, dizziness, lightheadedness, syncope is reported with these episodes. She is not currently taking metoprolol  but has it available if needed.  Otherwise doing well with no new or acute complaints today.   Review of systems complete and  found to be negative unless listed in HPI.   EP Information / Studies Reviewed:    EKG is ordered today. Personal review as below. Normal sinus rhythm, low voltage.  No PVCs.  Live Cardiac Monitor 06/2023:  Total Patch Wear Time:  5 days and 1 hours (2024-10-02T19:49:51-0400 to 2024-09-26T22:07:00-0400)   Monitor 1 HR 72 - 113, average 79 bpm. No atrial fibrillation detected. Rare supraventricular ectopy. Rare ventricular ectopy. No sustained arrhythmias. Symptom trigger episodes correspond to normal sinus rhythm.   Monitor 2 HR 66 - 143, average 75 bpm. 13 nonsustained VT (longest 5 beats) No atrial fibrillation detected. Rare supraventricular ectopy. Frequent ventricular ectopy, 5%. No sustained arrhythmias. Symptom trigger episodes correspond to PVCs.   RHC/LHC 07/19/23:  Conclusions: No angiographically significant coronary artery disease. Mildly to moderately elevated left heart and pulmonary artery pressures. Severely elevated right heart filling pressures. Normal Fick cardiac output/index. Hemodynamics with equivocal findings of cardiac tamponade (absent y-descent noted on RA pressure tracing, elevated right heart pressures not completely equalized with left heart pressures, and concordant LV/RV pressures).   Recommendations: Proceed with cardiac MRI. Favor deferring drainage of pericardial effusion at this time, given lack of symptoms and equivocal hemodynamic findings of tamponade physiology. Primary prevention of coronary artery disease.  Echo 07/17/23:  1. Limited echo windows- Definity contrast was not administered. Left  ventricular ejection fraction, by estimation, is 60 to 65%. The left  ventricle has normal function. The left ventricle has no regional wall  motion abnormalities. There is moderate  left ventricular hypertrophy. Left ventricular diastolic parameters are  consistent with Grade I diastolic dysfunction (impaired relaxation).  2. Poorly  visualized, appears underfilled. RA and RV collapse noted. RVEF  is reduced. Right ventricular systolic function is moderately reduced. The  right ventricular size is small. There is normal pulmonary artery systolic  pressure. The estimated right   ventricular systolic pressure is 11.7 mmHg.   3. Cannot rule-out tamponade physiology. Large pericardial effusion. The  pericardial effusion is circumferential.   4. The mitral valve is grossly normal. Trivial mitral valve  regurgitation.   5. The aortic valve was not well visualized. Aortic valve regurgitation  is not visualized. No aortic stenosis is present.   6. IVC is poorly visualized, but does not appear dilated.   Cardiac MRI 07/20/23:  IMPRESSION: 1. Initial study on 07/20/23 was very poor quality. She was brought back for repeat study on 07/21/23. Issue on initial study was that EKG gating was done but scanner was having difficulty detecting QRS complex due to low voltage, leading to inaccurate gating. For second study, switched to pulse triggering using pulse oximeter with significant improvement in image quality   2. Large pericardial effusion measuring up to 4cm adjacent to LV lateral wall. No evidence of ventricular interdependence on real time imaging   3. Myocardial T2 values are diffusely elevated, suggesting significant myocardial edema. In addition, there is marked elevation of ECV (40%) and patchy midwall LGE   4. Small biventricular size with normal systolic function. Asymmetric LV hypertrophy measuring 14mm in basal septum (10mm in posterior wall), not meeting criteria for HCM (<18mm)   5. Suspect findings (elevated T1/T2/ECV, LGE, pericardial effusion) are due to severe hypothyroidism. ECV however is higher than typically expected, and is on the border of the expected range for amyloid. Early amyloid is on the differential given the LVH, ECV, and LGE findings, though suspect more likely hypothyroidism as cause.  Recommend checking SPEP/UPEP/light chains and PYP scan to evaluate for cardiac amyloid, but if unremarkable suspect hypothyroidism as etiology of findings and would suggest repeating cardiac MRI once thyroid  disease has been treated   Physical Exam:   VS:  BP (!) 143/87   Pulse 91   Ht 5' 4.5 (1.638 m)   Wt 280 lb (127 kg)   SpO2 96%   BMI 47.32 kg/m    Wt Readings from Last 3 Encounters:  04/11/24 280 lb (127 kg)  01/14/24 276 lb (125.2 kg)  11/09/23 298 lb (135.2 kg)     GEN: Well nourished, well developed in no acute distress NECK: No JVD CARDIAC: Normal rate, regular rhythm. RESPIRATORY:  Clear to auscultation without rales, wheezing or rhonchi  ABDOMEN: Soft, non-distended EXTREMITIES:  No edema; No deformity   ASSESSMENT AND PLAN:    Mccayla Renee Arvie is a 43 y.o. 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  There have been case reports of VT in females with hypothyroidism.  Initial echo showed  normal left ventricle size and systolic function, but was notable for pericardial effusion which is likely secondary to her hypothyroid state.  She had cardiac MRI which appeared to be most consistent with edema secondary to her hypothyroid state rather than true infiltrative scar.  Since treating her thyroid , she has done well and experienced no sustained arrhythmias.  She has worn a [facility] monitor which confirmed no sustained arrhythmias.  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 No coronary disease. No definitive  scar on MRI, only significant myocardial edema. Amyloid workup was negative.  She wore a cardiac monitor for 5 days after hospital discharge and did not have any sustained arrhythmias. -She has subsequently stopped metoprolol  and has not had any recurrence of arrhythmia.  She will  keep metoprolol  available as needed.  #Hypertension -Above goal today.  Recommend checking blood pressures 1-2 times per week at home and recording the values.  Recommend bringing these recordings to the primary care physician.  #Postablative hypothyroidism: -Continue levothyroxine  and follow-up with PCP/endocrinology.  #Pericardial effusion: In presence of mixed edema, effusion was large.  More recent echo showed reduction in effusion to moderate.  She has had no clinical evidence of tamponade. -Continue follow-up with general cardiology for surveillance.  Follow up with Dr. Daneil Dunker in 6 months  Signed, Ardeen Kohler, MD

## 2024-04-11 ENCOUNTER — Ambulatory Visit: Attending: Cardiology | Admitting: Cardiology

## 2024-04-11 ENCOUNTER — Encounter: Payer: Self-pay | Admitting: Cardiology

## 2024-04-11 VITALS — BP 143/87 | HR 91 | Ht 64.5 in | Wt 280.0 lb

## 2024-04-11 DIAGNOSIS — I10 Essential (primary) hypertension: Secondary | ICD-10-CM | POA: Diagnosis not present

## 2024-04-11 DIAGNOSIS — E890 Postprocedural hypothyroidism: Secondary | ICD-10-CM

## 2024-04-11 DIAGNOSIS — I3139 Other pericardial effusion (noninflammatory): Secondary | ICD-10-CM

## 2024-04-11 DIAGNOSIS — I472 Ventricular tachycardia, unspecified: Secondary | ICD-10-CM | POA: Diagnosis not present

## 2024-04-11 NOTE — Patient Instructions (Signed)

## 2024-04-14 ENCOUNTER — Encounter: Payer: Self-pay | Admitting: Family Medicine

## 2024-04-14 ENCOUNTER — Ambulatory Visit: Admitting: Family Medicine

## 2024-04-14 VITALS — BP 126/74 | HR 90 | Temp 97.9°F | Resp 16 | Ht 64.5 in | Wt 280.4 lb

## 2024-04-14 DIAGNOSIS — E1165 Type 2 diabetes mellitus with hyperglycemia: Secondary | ICD-10-CM

## 2024-04-14 DIAGNOSIS — E785 Hyperlipidemia, unspecified: Secondary | ICD-10-CM | POA: Diagnosis not present

## 2024-04-14 DIAGNOSIS — I10 Essential (primary) hypertension: Secondary | ICD-10-CM

## 2024-04-14 DIAGNOSIS — Z7985 Long-term (current) use of injectable non-insulin antidiabetic drugs: Secondary | ICD-10-CM

## 2024-04-14 DIAGNOSIS — E890 Postprocedural hypothyroidism: Secondary | ICD-10-CM

## 2024-04-14 LAB — CBC WITH DIFFERENTIAL/PLATELET
Basophils Absolute: 0.1 10*3/uL (ref 0.0–0.1)
Basophils Relative: 0.6 % (ref 0.0–3.0)
Eosinophils Absolute: 0.1 10*3/uL (ref 0.0–0.7)
Eosinophils Relative: 1.3 % (ref 0.0–5.0)
HCT: 38.1 % (ref 36.0–46.0)
Hemoglobin: 12.7 g/dL (ref 12.0–15.0)
Lymphocytes Relative: 23.7 % (ref 12.0–46.0)
Lymphs Abs: 2.1 10*3/uL (ref 0.7–4.0)
MCHC: 33.4 g/dL (ref 30.0–36.0)
MCV: 90.8 fl (ref 78.0–100.0)
Monocytes Absolute: 0.4 10*3/uL (ref 0.1–1.0)
Monocytes Relative: 4.6 % (ref 3.0–12.0)
Neutro Abs: 6.3 10*3/uL (ref 1.4–7.7)
Neutrophils Relative %: 69.8 % (ref 43.0–77.0)
Platelets: 425 10*3/uL — ABNORMAL HIGH (ref 150.0–400.0)
RBC: 4.2 Mil/uL (ref 3.87–5.11)
RDW: 14.7 % (ref 11.5–15.5)
WBC: 9 10*3/uL (ref 4.0–10.5)

## 2024-04-14 LAB — BASIC METABOLIC PANEL WITH GFR
BUN: 11 mg/dL (ref 6–23)
CO2: 26 meq/L (ref 19–32)
Calcium: 9.2 mg/dL (ref 8.4–10.5)
Chloride: 102 meq/L (ref 96–112)
Creatinine, Ser: 0.74 mg/dL (ref 0.40–1.20)
GFR: 100.38 mL/min (ref >60.00)
Glucose, Bld: 140 mg/dL — ABNORMAL HIGH (ref 70–99)
Potassium: 4 meq/L (ref 3.5–5.1)
Sodium: 136 meq/L (ref 135–145)

## 2024-04-14 LAB — HEPATIC FUNCTION PANEL
ALT: 13 U/L (ref 0–35)
AST: 13 U/L (ref 0–37)
Albumin: 4.2 g/dL (ref 3.5–5.2)
Alkaline Phosphatase: 58 U/L (ref 39–117)
Bilirubin, Direct: 0.1 mg/dL (ref 0.0–0.3)
Total Bilirubin: 0.5 mg/dL (ref 0.2–1.2)
Total Protein: 7.7 g/dL (ref 6.0–8.3)

## 2024-04-14 LAB — LIPID PANEL
Cholesterol: 166 mg/dL (ref 0–200)
HDL: 51.6 mg/dL (ref >39.00)
LDL Cholesterol: 99 mg/dL (ref 0–99)
NonHDL: 114.63
Total CHOL/HDL Ratio: 3
Triglycerides: 78 mg/dL (ref 0.0–149.0)
VLDL: 15.6 mg/dL (ref 0.0–40.0)

## 2024-04-14 LAB — HEMOGLOBIN A1C: Hgb A1c MFr Bld: 0 % — ABNORMAL LOW (ref 4.6–6.5)

## 2024-04-14 LAB — TSH: TSH: 18.25 u[IU]/mL — ABNORMAL HIGH (ref 0.35–5.50)

## 2024-04-14 NOTE — Patient Instructions (Signed)
Follow up in 3-4 months to recheck diabetes We'll notify you of your lab results and make any changes if needed Continue to work on healthy diet and regular exercise- you can do it! Call with any questions or concerns Stay Safe!  Stay Healthy! Hang in there!!!

## 2024-04-14 NOTE — Assessment & Plan Note (Signed)
 Chronic problem.  Pt has had issues w/ compliance in the past which has led to difficult to manage hypothyroidism.  Currently on Levothyroxine  175mcg daily and asymptomatic.  Check labs.  Adjust meds prn

## 2024-04-14 NOTE — Assessment & Plan Note (Signed)
 Ongoing issue.  Attempting to control w/ diet and exercise.  Not currently on a statin.  Check labs.  Determine if medication is needed

## 2024-04-14 NOTE — Assessment & Plan Note (Signed)
 Ongoing issue.  Up 4 lbs since last visit and BMI now 47.39.  Stressed need for healthy diet and regular exercise.  Check labs to risk stratify.  Will follow.

## 2024-04-14 NOTE — Assessment & Plan Note (Signed)
 Chronic problem.  On Mounjaro  5mg  weekly.  Last A1C 7.9%.  UTD on foot exam, eye exam, microalbumin.  Check labs.  Adjust meds prn

## 2024-04-14 NOTE — Assessment & Plan Note (Signed)
 Chronic problem.  Currently well controlled on Metoprolol  and Spironolactone .  Asymptomatic.  Check labs due to diuretic use.  No anticipated med changes.

## 2024-04-14 NOTE — Progress Notes (Signed)
   Subjective:    Patient ID: Joanne Harris, female    DOB: Mar 21, 1981, 43 y.o.   MRN: [id number]  HPI DM- chronic problem.  On Mounjaro  5mg  weekly.  Last A1C 7.9%  UTD on microalbumin, foot exam, eye exam.  No abd pain.  Some nausea w/ medication.  No numbness/tingling of hands/feet.  HTN- chronic problem, on Metoprolol  XL 25mg  nightly, Spironolactone  25mg  daily w/ good control.  No CP, SOB, HA's, visual changes, edema.  Hypothyroid- chronic problem, on Levothyroxine  175mcg daily.  Denies changes to skin/hair/nails.  Obesity- ongoing issue.  Up 4 lbs since last visit.  BMI 47.39.   Review of Systems For ROS see HPI     Objective:   Physical Exam Vitals reviewed.  Constitutional:      General: She is not in acute distress.    Appearance: Normal appearance. She is well-developed. She is obese. She is not ill-appearing.  HENT:     Head: Normocephalic and atraumatic.   Eyes:     Conjunctiva/sclera: Conjunctivae normal.     Pupils: Pupils are equal, round, and reactive to light.   Neck:     Thyroid : No thyromegaly.   Cardiovascular:     Rate and Rhythm: Normal rate and regular rhythm.     Pulses: Normal pulses.     Heart sounds: Normal heart sounds. No murmur heard. Pulmonary:     Effort: Pulmonary effort is normal. No respiratory distress.     Breath sounds: Normal breath sounds.  Abdominal:     General: There is no distension.     Palpations: Abdomen is soft.     Tenderness: There is no abdominal tenderness.   Musculoskeletal:     Cervical back: Normal range of motion and neck supple.     Right lower leg: No edema.     Left lower leg: No edema.  Lymphadenopathy:     Cervical: No cervical adenopathy.   Skin:    General: Skin is warm and dry.   Neurological:     General: No focal deficit present.     Mental Status: She is alert and oriented to person, place, and time.   Psychiatric:        Mood and Affect: Mood normal.        Behavior: Behavior normal.         Thought Content: Thought content normal.           Assessment & Plan:

## 2024-04-17 ENCOUNTER — Ambulatory Visit: Payer: Self-pay | Admitting: Family Medicine

## 2024-04-17 ENCOUNTER — Encounter: Payer: Self-pay | Admitting: Family Medicine

## 2024-04-17 ENCOUNTER — Other Ambulatory Visit: Payer: Self-pay

## 2024-04-17 DIAGNOSIS — E0590 Thyrotoxicosis, unspecified without thyrotoxic crisis or storm: Secondary | ICD-10-CM

## 2024-04-17 DIAGNOSIS — E039 Hypothyroidism, unspecified: Secondary | ICD-10-CM

## 2024-04-17 DIAGNOSIS — E1165 Type 2 diabetes mellitus with hyperglycemia: Secondary | ICD-10-CM

## 2024-04-17 MED ORDER — LEVOTHYROXINE SODIUM 200 MCG PO TABS: 200.0000 ug | ORAL_TABLET | Freq: Every day | ORAL | 3 refills | Status: DC

## 2024-06-27 ENCOUNTER — Other Ambulatory Visit: Payer: Self-pay | Admitting: Family Medicine

## 2024-06-27 DIAGNOSIS — Z1231 Encounter for screening mammogram for malignant neoplasm of breast: Secondary | ICD-10-CM

## 2024-07-07 ENCOUNTER — Ambulatory Visit
Admission: RE | Admit: 2024-07-07 | Discharge: 2024-07-07 | Disposition: A | Discharge status: Home / Self Care | Payer: Self-pay | Source: Ambulatory Visit | Attending: Family Medicine | Admitting: Family Medicine

## 2024-07-07 DIAGNOSIS — Z1231 Encounter for screening mammogram for malignant neoplasm of breast: Secondary | ICD-10-CM

## 2024-07-12 ENCOUNTER — Other Ambulatory Visit: Payer: Self-pay | Admitting: Family Medicine

## 2024-07-12 ENCOUNTER — Telehealth: Payer: Self-pay | Admitting: Family Medicine

## 2024-07-12 DIAGNOSIS — R928 Other abnormal and inconclusive findings on diagnostic imaging of breast: Secondary | ICD-10-CM

## 2024-07-12 NOTE — Telephone Encounter (Signed)
 Obtained document from front desk. Placed in providers folder at nurse station.

## 2024-07-12 NOTE — Telephone Encounter (Signed)
 Type of form received: [facility] Order  Additional comments:   Received by: Fax  Form should be Faxed/mailed to: (address/ fax #) [phone]  Is patient requesting call for pickup: N/A  Form placed:  Labeled & placed in provider bin  Attach charge sheet.  Provider will determine charge.  Individual made aware of 3-5 business day turn around? N/A

## 2024-07-14 ENCOUNTER — Ambulatory Visit: Admitting: Family Medicine

## 2024-07-17 ENCOUNTER — Encounter: Payer: Self-pay | Admitting: Family Medicine

## 2024-07-17 ENCOUNTER — Ambulatory Visit: Admitting: Family Medicine

## 2024-07-17 NOTE — Telephone Encounter (Signed)
 Signed and returned to [facility]

## 2024-07-17 NOTE — Telephone Encounter (Signed)
 Faxed to [facility] [place]. Scanned to patients chart. Placed in folder at front desk

## 2024-07-17 NOTE — Telephone Encounter (Signed)
 Patient called in this AM to cancel her appointment for tpday but was not able to get through to anyone. Patient states she has the stomach bug she believes and could not make it. It looks like this was canceled looking in her chart and rescheduled for 07/27/2024

## 2024-07-17 NOTE — Telephone Encounter (Signed)
 Patient rescheduled appointment due to stomach bug but was unable to reach anyone over phone to cancel her appointment today. Looking in patients chart It looks like her appointment today was canceled.

## 2024-07-21 ENCOUNTER — Ambulatory Visit
Admission: RE | Admit: 2024-07-21 | Discharge: 2024-07-21 | Disposition: A | Discharge status: Home / Self Care | Source: Ambulatory Visit | Attending: Family Medicine | Admitting: Family Medicine

## 2024-07-21 DIAGNOSIS — R928 Other abnormal and inconclusive findings on diagnostic imaging of breast: Secondary | ICD-10-CM

## 2024-07-27 ENCOUNTER — Encounter: Payer: Self-pay | Admitting: Family Medicine

## 2024-07-27 ENCOUNTER — Ambulatory Visit: Admitting: Family Medicine

## 2024-07-27 ENCOUNTER — Other Ambulatory Visit

## 2024-07-27 VITALS — BP 120/70 | HR 92 | Temp 98.2°F | Wt 289.0 lb

## 2024-07-27 DIAGNOSIS — Z7985 Long-term (current) use of injectable non-insulin antidiabetic drugs: Secondary | ICD-10-CM

## 2024-07-27 DIAGNOSIS — E1165 Type 2 diabetes mellitus with hyperglycemia: Secondary | ICD-10-CM

## 2024-07-27 DIAGNOSIS — E890 Postprocedural hypothyroidism: Secondary | ICD-10-CM | POA: Diagnosis not present

## 2024-07-27 DIAGNOSIS — F341 Dysthymic disorder: Secondary | ICD-10-CM | POA: Diagnosis not present

## 2024-07-27 LAB — BASIC METABOLIC PANEL WITH GFR
BUN: 11 mg/dL (ref 6–23)
CO2: 25 meq/L (ref 19–32)
Calcium: 9.5 mg/dL (ref 8.4–10.5)
Chloride: 101 meq/L (ref 96–112)
Creatinine, Ser: 0.7 mg/dL (ref 0.40–1.20)
GFR: 107.09 mL/min (ref >60.00)
Glucose, Bld: 159 mg/dL — ABNORMAL HIGH (ref 70–99)
Potassium: 4 meq/L (ref 3.5–5.1)
Sodium: 137 meq/L (ref 135–145)

## 2024-07-27 LAB — T3, FREE: T3, Free: 2.8 pg/mL (ref 2.3–4.2)

## 2024-07-27 LAB — TSH: TSH: 6.64 u[IU]/mL — ABNORMAL HIGH (ref 0.35–5.50)

## 2024-07-27 LAB — T4, FREE: Free T4: 1.09 ng/dL (ref 0.60–1.60)

## 2024-07-27 MED ORDER — SERTRALINE HCL 150 MG PO CAPS: 1.0000 | ORAL_CAPSULE | Freq: Every day | ORAL | 1 refills | Status: DC

## 2024-07-27 NOTE — Progress Notes (Signed)
   Subjective:    Patient ID: Joanne Harris, female    DOB: 12-Apr-1981, 43 y.o.   MRN: [id number]  HPI DM- ongoing issue.  On Mounjaro  5mg  weekly.  No CP, SOB, HA's, visual changes, abd pain, N/V.  Due for eye exam, foot exam, microalbumin.  Hypothyroid- chronic problem.  Pt is currently on Levothyroxine  200mcg daily.  Last TSH 18.25 and she did not return for repeat labs.  Obesity- pt has gained 9 lbs since June.  Depression/anxiety- ongoing issue.  Currently on Sertraline  100mg  daily and Wellbutrin  150mg  daily.  Has not been consistent w/ Wellbutrin  b/c she doesn't feel that it was helpful.  Pt reports increased sadness recently.  Is overthinking things, having a 'hard time letting things go'.     Review of Systems For ROS see HPI     Objective:   Physical Exam Vitals reviewed.  Constitutional:      General: She is not in acute distress.    Appearance: Normal appearance. She is well-developed. She is obese. She is not ill-appearing.  HENT:     Head: Normocephalic and atraumatic.  Eyes:     Extraocular Movements: Extraocular movements intact.     Conjunctiva/sclera: Conjunctivae normal.     Pupils: Pupils are equal, round, and reactive to light.  Neck:     Thyroid : No thyromegaly.  Cardiovascular:     Rate and Rhythm: Normal rate and regular rhythm.     Heart sounds: Normal heart sounds. No murmur heard. Pulmonary:     Effort: Pulmonary effort is normal. No respiratory distress.     Breath sounds: Normal breath sounds.  Abdominal:     General: There is no distension.     Palpations: Abdomen is soft.     Tenderness: There is no abdominal tenderness.  Musculoskeletal:     Cervical back: Normal range of motion and neck supple.  Lymphadenopathy:     Cervical: No cervical adenopathy.  Skin:    General: Skin is warm and dry.  Neurological:     General: No focal deficit present.     Mental Status: She is alert and oriented to person, place, and time.  Psychiatric:         Mood and Affect: Mood normal.        Behavior: Behavior normal.        Thought Content: Thought content normal.           Assessment & Plan:

## 2024-07-27 NOTE — Patient Instructions (Addendum)
 Follow up in 3-4 months to recheck sugar, blood pressure, cholesterol We'll notify you of your lab results and make any changes if needed Continue to work on low carb diet and regular exercise- you can do this!!! INCREASE the Sertraline  to 150mg  daily Call with any questions or concerns Stay Safe!  Stay Healthy! Hang in there!!!

## 2024-07-27 NOTE — Assessment & Plan Note (Signed)
 Chronic problem.  On Mounjaro  5mg  weekly w/o difficulty.  Foot exam done today.  Microalbumin ordered.  Encouraged her to schedule eye exam.  Check labs.  Adjust meds prn

## 2024-07-27 NOTE — Assessment & Plan Note (Signed)
 Deteriorated.  Pt has gained 9 lbs since June.  Suspect her depression and thyroid  are playing a role in this.  Will increase depression medication and recheck thyroid .  Pt expressed understanding and is in agreement w/ plan.

## 2024-07-27 NOTE — Assessment & Plan Note (Signed)
 Deteriorated.  Pt is having increased sadness and over thinking things.  Does not feel Wellbutrin  was helpful.  Will remove from list.  Increase Sertraline  to 150mg  daily and continue to follow.  Pt expressed understanding and is in agreement w/ plan.

## 2024-07-27 NOTE — Assessment & Plan Note (Signed)
 Chronic problem.  Pt has had an issue w/ compliance in the past.  She states she is taking her medication as directed.  Check labs.  Adjust meds prn

## 2024-07-28 ENCOUNTER — Ambulatory Visit: Payer: Self-pay | Admitting: Family Medicine

## 2024-07-28 LAB — HEMOGLOBIN A1C
Est. average glucose Bld gHb Est-mCnc: 177 mg/dL
Hgb A1c MFr Bld: 7.8 % — ABNORMAL HIGH (ref 4.8–5.6)

## 2024-07-28 NOTE — Progress Notes (Signed)
 Tried to call patient to relay lab results. Unable to leave vm to return call

## 2024-08-01 ENCOUNTER — Ambulatory Visit: Payer: Self-pay | Admitting: Family Medicine

## 2024-09-25 ENCOUNTER — Emergency Department (HOSPITAL_BASED_OUTPATIENT_CLINIC_OR_DEPARTMENT_OTHER)

## 2024-09-25 ENCOUNTER — Other Ambulatory Visit: Payer: Self-pay

## 2024-09-25 ENCOUNTER — Observation Stay (HOSPITAL_BASED_OUTPATIENT_CLINIC_OR_DEPARTMENT_OTHER)
Admission: EM | Admit: 2024-09-25 | Discharge: 2024-09-29 | DRG: 309 | Disposition: A | Attending: Family Medicine | Admitting: Family Medicine

## 2024-09-25 DIAGNOSIS — I472 Ventricular tachycardia, unspecified: Principal | ICD-10-CM | POA: Diagnosis present

## 2024-09-25 DIAGNOSIS — E119 Type 2 diabetes mellitus without complications: Secondary | ICD-10-CM

## 2024-09-25 DIAGNOSIS — D72829 Elevated white blood cell count, unspecified: Secondary | ICD-10-CM | POA: Diagnosis present

## 2024-09-25 DIAGNOSIS — E039 Hypothyroidism, unspecified: Secondary | ICD-10-CM | POA: Diagnosis present

## 2024-09-25 LAB — CBC
HCT: 39.5 % (ref 36.0–46.0)
Hemoglobin: 13.4 g/dL (ref 12.0–15.0)
MCH: 30.8 pg (ref 26.0–34.0)
MCHC: 33.9 g/dL (ref 30.0–36.0)
MCV: 90.8 fL (ref 80.0–100.0)
Platelets: 438 K/uL — ABNORMAL HIGH (ref 150–400)
RBC: 4.35 MIL/uL (ref 3.87–5.11)
RDW: 12.4 % (ref 11.5–15.5)
WBC: 14.9 K/uL — ABNORMAL HIGH (ref 4.0–10.5)
nRBC: 0 % (ref 0.0–0.2)

## 2024-09-25 LAB — TROPONIN T, HIGH SENSITIVITY
Troponin T High Sensitivity: 18 ng/L (ref 0–19)
Troponin T High Sensitivity: 21 ng/L — ABNORMAL HIGH (ref 0–19)

## 2024-09-25 LAB — BASIC METABOLIC PANEL WITH GFR
Anion gap: 14 (ref 5–15)
BUN: 17 mg/dL (ref 6–20)
CO2: 23 mmol/L (ref 22–32)
Calcium: 9.7 mg/dL (ref 8.9–10.3)
Chloride: 98 mmol/L (ref 98–111)
Creatinine, Ser: 0.86 mg/dL (ref 0.44–1.00)
GFR, Estimated: >60 mL/min (ref >60)
Glucose, Bld: 288 mg/dL — ABNORMAL HIGH (ref 70–99)
Potassium: 3.8 mmol/L (ref 3.5–5.1)
Sodium: 135 mmol/L (ref 135–145)

## 2024-09-25 LAB — HCG, SERUM, QUALITATIVE: Preg, Serum: NEGATIVE

## 2024-09-25 LAB — MAGNESIUM: Magnesium: 1.6 mg/dL — ABNORMAL LOW (ref 1.7–2.4)

## 2024-09-25 MED ORDER — METOPROLOL TARTRATE 25 MG PO TABS
25.0000 mg | ORAL_TABLET | Freq: Once | ORAL | Status: AC
  Administered 2024-09-25: 25 mg via ORAL
  Filled 2024-09-25: qty 1

## 2024-09-25 MED ORDER — HEPARIN BOLUS VIA INFUSION
5000.0000 [IU] | Freq: Once | INTRAVENOUS | Status: AC
  Administered 2024-09-25: 5000 [IU] via INTRAVENOUS

## 2024-09-25 MED ORDER — HEPARIN (PORCINE) 25000 UT/250ML-% IV SOLN
1300.0000 [IU]/h | INTRAVENOUS | Status: DC
  Administered 2024-09-25: 1300 [IU]/h via INTRAVENOUS
  Filled 2024-09-25: qty 250

## 2024-09-25 MED ORDER — ETOMIDATE 2 MG/ML IV SOLN
10.0000 mg | Freq: Once | INTRAVENOUS | Status: AC
  Administered 2024-09-25: 10 mg via INTRAVENOUS
  Filled 2024-09-25: qty 10

## 2024-09-25 NOTE — ED Notes (Signed)
Lab notified of magnesium add-on

## 2024-09-25 NOTE — ED Notes (Signed)
 ED Provider at bedside.

## 2024-09-25 NOTE — ED Provider Notes (Signed)
 [place] EMERGENCY DEPARTMENT AT [facility] [place] Provider Note   CSN: [id number] Arrival date & time: 09/25/24  1658     Patient presents with: No chief complaint on file.   Joanne Harris is a 43 y.o. female.   43 year old female with past medical history of thyroid  dysfunction and wide complex tachycardia in the past presenting to the emergency department today with chest pressure, tightness, and palpitations.  This began at 3:30 PM.  The patient waited at home to see if this would resolve on its own but when it did not she came to the ER for further evaluation.  She did walk in with this.  She denies a history of DVT or pulmonary embolism, recent surgeries, recent travel.        Prior to Admission medications   Medication Sig Start Date End Date Taking? Authorizing Provider  acetaminophen  (TYLENOL ) 325 MG tablet Take 2 tablets (650 mg total) by mouth every 6 (six) hours as needed for mild pain, fever or headache. 07/21/23   Danton Reyes DASEN, MD  glucose blood (ONETOUCH VERIO) test strip Use to test blood sugar 2 times daily as instructed. 11/05/14   Trixie File, MD  levothyroxine  (SYNTHROID ) 200 MCG tablet Take 1 tablet (200 mcg total) by mouth daily. 04/17/24   Tabori, Katherine E, MD  Melatonin 10 MG CHEW Chew 10 mg by mouth at bedtime.    [provider]  metoprolol  succinate (TOPROL -XL) 25 MG 24 hr tablet Take 1 tablet (25 mg total) by mouth at bedtime. 08/11/23   O'Neal, Darryle Ned, MD  ondansetron  (ZOFRAN ) 4 MG tablet Take 1 tablet (4 mg total) by mouth every 8 (eight) hours as needed for nausea or vomiting. 07/28/23   Tabori, Katherine E, MD  [facility] DELICA LANCETS FINE MISC Use to test blood sugar 2 times daily as instructed. 11/05/14   Trixie File, MD  Sertraline  HCl 150 MG CAPS Take 1 capsule (150 mg total) by mouth daily. 07/27/24   Tabori, Katherine E, MD  spironolactone  (ALDACTONE ) 25 MG tablet Take 1 tablet (25 mg total) by mouth  daily. 08/11/23   O'NealDarryle Ned, MD  tirzepatide  (MOUNJARO ) 5 MG/0.5ML Pen Inject 5 mg into the skin once a week. 01/14/24   Tabori, Katherine E, MD  Vitamin D , Cholecalciferol, 25 MCG (1000 UT) TABS Take 3 tablets by mouth once. 3000mg  07/29/23   [provider]    Allergies: Patient has no known allergies.    Review of Systems  Respiratory:  Positive for chest tightness.   Cardiovascular:  Positive for palpitations.  All other systems reviewed and are negative.   Updated Vital Signs BP (!) 135/101   Pulse (!) 107   Temp 98.2 F (36.8 C)   Resp (!) 24   Ht 5' 4.5 (1.638 m)   Wt 127 kg   SpO2 100%   BMI 47.32 kg/m   Physical Exam Vitals and nursing note reviewed.   Gen: NAD Eyes: PERRL, EOMI HEENT: no oropharyngeal swelling Neck: trachea midline Resp: clear to auscultation bilaterally Card: Tachycardic, no murmurs, rubs, or gallops Abd: nontender, nondistended Extremities: no calf tenderness, no edema Vascular: 2+ radial pulses bilaterally, 2+ DP pulses bilaterally Skin: no rashes Psyc: acting appropriately   (all labs ordered are listed, but only abnormal results are displayed) Labs Reviewed  BASIC METABOLIC PANEL WITH GFR - Abnormal; Notable for the following components:      Result Value   Glucose, Bld 288 (*)  All other components within normal limits  CBC - Abnormal; Notable for the following components:   WBC 14.9 (*)    Platelets 438 (*)    All other components within normal limits  HCG, SERUM, QUALITATIVE  TSH  HEPARIN  LEVEL (UNFRACTIONATED)  TROPONIN T, HIGH SENSITIVITY  TROPONIN T, HIGH SENSITIVITY    EKG: None  Radiology: DG Chest Port 1 View Result Date: 09/25/2024 CLINICAL DATA:  Chest pain EXAM: PORTABLE CHEST 1 VIEW COMPARISON:  Chest x-ray 07/16/2023 FINDINGS: The heart size and mediastinal contours are within normal limits. Both lungs are clear. The visualized skeletal structures are unremarkable. IMPRESSION: No active  disease. Electronically Signed   By: Greig Pique M.D.   On: 09/25/2024 18:40     .Sedation  Date/Time: 09/25/2024 5:46 PM  Performed by: Ula Prentice SAUNDERS, MD Authorized by: Ula Prentice SAUNDERS, MD   Consent:    Consent obtained:  Written   Consent given by:  Patient   Risks discussed:  Allergic reaction, dysrhythmia, prolonged hypoxia resulting in organ damage, prolonged sedation necessitating reversal, inadequate sedation, respiratory compromise necessitating ventilatory assistance and intubation, nausea and vomiting   Alternatives discussed:  Analgesia without sedation Universal protocol:    Immediately prior to procedure, a time out was called: yes   Indications:    Procedure performed:  Cardioversion Pre-sedation assessment:    Time since last food or drink:  1200   ASA classification: class 2 - patient with mild systemic disease     Mouth opening:  3 or more finger widths   Thyromental distance:  4 finger widths   Mallampati score:  III - soft palate, base of uvula visible   Neck mobility: normal     Pre-sedation assessments completed and reviewed: pre-procedure airway patency not reviewed, pre-procedure cardiovascular function not reviewed, pre-procedure hydration status not reviewed, pre-procedure mental status not reviewed, pre-procedure nausea and vomiting status not reviewed, pre-procedure pain level not reviewed, pre-procedure respiratory function not reviewed and pre-procedure temperature not reviewed   A pre-sedation assessment was completed prior to the start of the procedure Immediate pre-procedure details:    Reviewed: vital signs     Verified: bag valve mask available, emergency equipment available, intubation equipment available, IV patency confirmed, oxygen available, reversal medications available and suction available   Procedure details (see MAR for exact dosages):    Sedation:  Etomidate    Intended level of sedation: deep   Intra-procedure monitoring:  Blood pressure  monitoring, continuous capnometry, frequent LOC assessments, frequent vital sign checks, continuous pulse oximetry and cardiac monitor   Intra-procedure events: none     Intra-procedure management:  Airway repositioning   Total Provider sedation time (minutes):  10 Post-procedure details:   A post-sedation assessment was completed following the completion of the procedure.   Post-sedation assessments completed and reviewed: post-procedure airway patency not reviewed, post-procedure cardiovascular function not reviewed, post-procedure hydration status not reviewed, post-procedure mental status not reviewed, post-procedure nausea and vomiting status not reviewed, pain score not reviewed, post-procedure respiratory function not reviewed and post-procedure temperature not reviewed     Patient is stable for discharge or admission: yes     Procedure completion:  Tolerated well, no immediate complications .Cardioversion  Date/Time: 09/25/2024 5:47 PM  Performed by: Ula Prentice SAUNDERS, MD Authorized by: Ula Prentice SAUNDERS, MD   Consent:    Consent obtained:  Written   Consent given by:  Patient   Risks discussed:  Cutaneous burn, death, induced arrhythmia and pain   Alternatives discussed:  No treatment Pre-procedure details:    Cardioversion basis:  Emergent   Rhythm:  Ventricular tachycardia   Electrode placement:  Anterior-posterior Patient sedated: Yes. Refer to sedation procedure documentation for details of sedation.  Attempt one:    Cardioversion mode:  Synchronous   Waveform:  Biphasic   Shock (Joules):  120   Shock outcome:  Conversion to normal sinus rhythm    Medications Ordered in the ED  heparin  ADULT infusion 100 units/mL (25000 units/212mL) (1,300 Units/hr Intravenous New Bag/Given 09/25/24 1903)  etomidate  (AMIDATE ) injection 10 mg (10 mg Intravenous Given 09/25/24 1733)  metoprolol  tartrate (LOPRESSOR ) tablet 25 mg (25 mg Oral Given 09/25/24 1844)  heparin  bolus via infusion 5,000  Units (5,000 Units Intravenous Bolus from Bag 09/25/24 1904)                                    Medical Decision Making 43 year old female with past medical history of thyroid  dysfunction and wide-complex tachycardia in the past presenting to the emergency department today with chest pressure and palpitations.  The patient is in a wide-complex tachycardia here on arrival.  Her heart rate is in the 170s.  Does appear that she has presented similarly in the past.  With the wide-complex tachycardia this could be secondary to atrial flutter with a aberrancy versus ventricular tachycardia.  With concerns for ventricular tachycardia did discuss this with the patient and we will go forward with cardioversion here.  Will obtain basic labs here to evaluate for anemia or electrolyte abnormalities.  I discussed the patient's case with Dr. Regino.  Recommend starting the patient on oral metoprolol .  Recommend starting the patient on amiodarone if she has another recurrent episode and to start the patient on heparin  in the event that this is due to atrial tachycardia.  Recommends admission to the medicine service.  A call is placed to medicine for admission.  CRITICAL CARE Performed by: Prentice JONELLE Medicus   Total critical care time: 60 minutes  Critical care time was exclusive of separately billable procedures and treating other patients.  Critical care was necessary to treat or prevent imminent or life-threatening deterioration.  Critical care was time spent personally by me on the following activities: development of treatment plan with patient and/or surrogate as well as nursing, discussions with consultants, evaluation of patient's response to treatment, examination of patient, obtaining history from patient or surrogate, ordering and performing treatments and interventions, ordering and review of laboratory studies, ordering and review of radiographic studies, pulse oximetry and re-evaluation of patient's  condition.   Amount and/or Complexity of Data Reviewed Labs: ordered. Radiology: ordered.  Risk Prescription drug management. Decision regarding hospitalization.        Final diagnoses:  Ventricular tachycardia [facility])    ED Discharge Orders     None          Medicus Prentice JONELLE, MD 09/25/24 [phone]

## 2024-09-25 NOTE — ED Notes (Signed)
 Respiratory Therapist at [facility]  in room number 14 during procedure.  Suction with [place] at [facility] set up and ready to use. Ambu bag at [facility] and ready to use.  Patient placed on ETCO2 Nasal Cannula at 2 LPM.    Vitals at conclusion of procedure:  ETCO2 35 mmHg HR 109 RR 24 SPO2 100%

## 2024-09-25 NOTE — Progress Notes (Signed)
 PHARMACY - ANTICOAGULATION CONSULT NOTE  Pharmacy Consult for heparin  Indication: atrial fibrillation  No Known Allergies  Patient Measurements: Height: 5' 4.5 (163.8 cm) Weight: 127 kg (280 lb) IBW/kg (Calculated) : 55.85 HEPARIN  DW (KG): 87  Vital Signs: Temp: 98.2 F (36.8 C) (12/01 1745) Temp Source: Oral (12/01 1732) BP: 143/104 (12/01 1745) Pulse Rate: 108 (12/01 1745)  Labs: Recent Labs    09/25/24 1725  HGB 13.4  HCT 39.5  PLT 438*    CrCl cannot be calculated (Patient's most recent lab result is older than the maximum 21 days allowed.).   Medical History: Past Medical History:  Diagnosis Date   Depression    Diabetes mellitus    Hypothyroid    Leukocytosis 01/25/2020   Obesity    Thrombocytosis 01/25/2020     Assessment: 43 YOF presenting with CP, in Afib, she is not on anticoagulation PTA  Goal of Therapy:  Heparin  level 0.3-0.7 units/ml Monitor platelets by anticoagulation protocol: Yes   Plan:  Heparin  5000 units IV x 1, heparin  gtt at 1300 units/hr F/u 6 hour heparin  level F/u long term [facility] plan  Dorn Poot, PharmD, [facility] Clinical Pharmacist ED Pharmacist Phone # [phone] 09/25/2024 6:23 PM

## 2024-09-25 NOTE — Consult Note (Incomplete)
 Cardiology Consultation   Patient ID: Joanne Harris MRN: [id number]; DOB: 1981/03/16  Admit date: 09/25/2024 Date of Consult: 09/25/2024  PCP:  Mahlon Comer BRAVO, MD   [facility] Providers Cardiologist:  Darryle ONEIDA Decent, MD  Electrophysiologist:  Fonda Kitty, MD  { Click here to update MD or APP on Care Team, Refresh:1}     Patient Profile: Joanne Harris is a 43 y.o. 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  History of Present Illness: Joanne Harris is a 43 y.o. 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  Patient presented to ER with c/o chest pressure, tightness and palpitations that began today, no syncope. In the ER EKG shows regular WCT at HR 172bpm, RBBB pattern, there is a capture beat and could be also a fusion beat- points towards VT. This EKG is similar to 06/2023 when she had idiopathic VT.  Pt underwent synchronized DCCV in the ER and Cardiology was called for further recs.  Pt is being transferred to [facility] and being admitted to Medicine service.  Trop is 18-> 21 Repeat EKG after DCCV shows Sinus tachy, PVC at HR 106bpm    Prior Cardiac detailed work up as below: ECHO: 11/2023  1. Left ventricular ejection fraction, by estimation, is 60 to 65%. The  left ventricle has normal function. The left ventricle has no regional  wall motion abnormalities. Left ventricular diastolic parameters are  consistent with Grade I diastolic  dysfunction (impaired relaxation). The average left ventricular global  longitudinal strain is -20.6 %. The global longitudinal  strain is normal.   2. Right ventricular systolic function is normal. The right ventricular  size is normal. Tricuspid regurgitation signal is inadequate for assessing  PA pressure.   3. The mitral valve is normal in structure. Trivial mitral valve  regurgitation. No evidence of mitral stenosis.   4. The aortic valve is tricuspid. Aortic valve regurgitation is not  visualized. No aortic stenosis is present.   5. The IVC was not visualized.   6. Moderate pericardial effusion present primarily towards the base of  the heart (adjacent to the atria). There is no evidence of cardiac  tamponade.   Live Cardiac Monitor 06/2023:  Total Patch Wear Time:  5 days and 1 hours (2024-10-02T19:49:51-0400 to 2024-09-26T22:07:00-0400)   Monitor 1 HR 72 - 113, average 79 bpm. No atrial fibrillation detected. Rare supraventricular ectopy. Rare ventricular ectopy. No sustained arrhythmias. Symptom trigger episodes correspond to normal sinus rhythm.   Monitor 2 HR 66 - 143, average 75 bpm. 13 nonsustained VT (longest 5 beats) No atrial fibrillation detected. Rare supraventricular ectopy. Frequent ventricular ectopy, 5%. No sustained arrhythmias. Symptom trigger episodes correspond to PVCs.   RHC/LHC 07/19/23:  Conclusions: No angiographically significant coronary artery disease. Mildly to moderately elevated left heart and pulmonary artery pressures. Severely elevated right heart filling pressures. Normal Fick cardiac output/index. Hemodynamics with equivocal findings of cardiac tamponade (absent y-descent noted on RA pressure tracing, elevated right heart pressures not completely equalized with left heart pressures, and concordant LV/RV pressures).   Recommendations: Proceed with cardiac MRI. Favor deferring drainage of pericardial  effusion at this time, given lack of symptoms and equivocal hemodynamic findings of tamponade physiology. Primary prevention of coronary artery disease. Cardiac MRI  07/20/23:  IMPRESSION: 1. Initial study on 07/20/23 was very poor quality. She was brought back for repeat study on 07/21/23. Issue on initial study was that EKG gating was done but scanner was having difficulty detecting QRS complex due to low voltage, leading to inaccurate gating. For second study, switched to pulse triggering using pulse oximeter with significant improvement in image quality   2. Large pericardial effusion measuring up to 4cm adjacent to LV lateral wall. No evidence of ventricular interdependence on real time imaging   3. Myocardial T2 values are diffusely elevated, suggesting significant myocardial edema. In addition, there is marked elevation of ECV (40%) and patchy midwall LGE   4. Small biventricular size with normal systolic function. Asymmetric LV hypertrophy measuring 14mm in basal septum (10mm in posterior wall), not meeting criteria for HCM (<79mm)   5. Suspect findings (elevated T1/T2/ECV, LGE, pericardial effusion) are due to severe hypothyroidism. ECV however is higher than typically expected, and is on the border of the expected range for amyloid. Early amyloid is on the differential given the LVH, ECV, and LGE findings, though suspect more likely hypothyroidism as cause. Recommend checking SPEP/UPEP/light chains and PYP scan to evaluate for cardiac amyloid, but if unremarkable suspect hypothyroidism as etiology of findings and would suggest repeating cardiac MRI once thyroid  disease has been treated   Amyloid workup was negative. She wore a cardiac monitor for 5 days after hospital discharge and did not have any sustained arrhythmias.      Past Medical History:  Diagnosis Date   Depression    Diabetes mellitus    Hypothyroid    Leukocytosis 01/25/2020   Obesity    Thrombocytosis 01/25/2020    Past Surgical History:  Procedure Laterality Date   CHOLECYSTECTOMY     RIGHT/LEFT HEART CATH AND CORONARY ANGIOGRAPHY N/A 07/19/2023   Procedure:  RIGHT/LEFT HEART CATH AND CORONARY ANGIOGRAPHY;  Surgeon: Mady Bruckner, MD;  Location: [facility] INVASIVE CV LAB;  Service: Cardiovascular;  Laterality: N/A;     Home Medications:  Prior to Admission medications   Medication Sig Start Date End Date Taking? Authorizing Provider  acetaminophen  (TYLENOL ) 325 MG tablet Take 2 tablets (650 mg total) by mouth every 6 (six) hours as needed for mild pain, fever or headache. 07/21/23   Danton Reyes DASEN, MD  glucose blood (ONETOUCH VERIO) test strip Use to test blood sugar 2 times daily as instructed. 11/05/14   Trixie File, MD  levothyroxine  (SYNTHROID ) 200 MCG tablet Take 1 tablet (200 mcg total) by mouth daily. 04/17/24   Tabori, Katherine E, MD  Melatonin 10 MG CHEW Chew 10 mg by mouth at bedtime.    [provider]  metoprolol  succinate (TOPROL -XL) 25 MG 24 hr tablet Take 1 tablet (25 mg total) by mouth at bedtime. 08/11/23   O'NealDarryle Ned, MD  ondansetron  (ZOFRAN ) 4 MG tablet Take 1 tablet (4 mg total) by mouth every 8 (eight) hours as needed for nausea or vomiting. 07/28/23   Tabori, Katherine E, MD  [facility] DELICA LANCETS FINE MISC Use to test blood sugar 2 times daily as instructed. 11/05/14   Trixie File, MD  Sertraline  HCl 150 MG CAPS Take 1 capsule (150 mg total) by mouth daily. 07/27/24   Tabori, Katherine E, MD  spironolactone  (ALDACTONE ) 25 MG tablet Take 1 tablet (25 mg total) by mouth daily. 08/11/23  Barbaraann Darryle Ned, MD  tirzepatide  (MOUNJARO ) 5 MG/0.5ML Pen Inject 5 mg into the skin once a week. 01/14/24   Tabori, Katherine E, MD  Vitamin D , Cholecalciferol, 25 MCG (1000 UT) TABS Take 3 tablets by mouth once. 3000mg  07/29/23   [provider]    Scheduled Meds:  Continuous Infusions:  heparin  1,300 Units/hr (09/25/24 1903)   PRN Meds:   Allergies:   No Known Allergies  Social History:   Social History   Socioeconomic History   Marital status: Single    Spouse name: Not on file   Number  of children: Not on file   Years of education: Not on file   Highest education level: Master's degree (e.g., MA, MS, MEng, MEd, MSW, MBA)  Occupational History   Not on file  Tobacco Use   Smoking status: Never   Smokeless tobacco: Never  Vaping Use   Vaping status: Never Used  Substance and Sexual Activity   Alcohol use: Yes    Comment: occ.   Drug use: No   Sexual activity: Not on file  Other Topics Concern   Not on file  Social History Narrative   Not on file   Social Drivers of Health   Financial Resource Strain: Low Risk  (04/13/2024)   Overall Financial Resource Strain (CARDIA)    Difficulty of Paying Living Expenses: Not hard at all  Food Insecurity: No Food Insecurity (04/13/2024)   Hunger Vital Sign    Worried About Running Out of Food in the Last Year: Never true    Ran Out of Food in the Last Year: Never true  Transportation Needs: No Transportation Needs (04/13/2024)   PRAPARE - Administrator, Civil Service (Medical): No    Lack of Transportation (Non-Medical): No  Physical Activity: Insufficiently Active (04/13/2024)   Exercise Vital Sign    Days of Exercise per Week: 2 days    Minutes of Exercise per Session: 30 min  Stress: Stress Concern Present (04/13/2024)   [employer] of Occupational Health - Occupational Stress Questionnaire    Feeling of Stress: Rather much  Social Connections: Moderately Integrated (04/13/2024)   Social Connection and Isolation Panel    Frequency of Communication with Friends and Family: More than three times a week    Frequency of Social Gatherings with Friends and Family: Once a week    Attends Religious Services: More than 4 times per year    Active Member of [employer] or Organizations: Yes    Attends Engineer, Structural: More than 4 times per year    Marital Status: Never married  Intimate Partner Violence: Not At Risk (07/29/2023)   Humiliation, Afraid, Rape, and Kick questionnaire    Fear of Current or  Ex-Partner: No    Emotionally Abused: No    Physically Abused: No    Sexually Abused: No    Family History:    Family History  Problem Relation Age of Onset   Anemia Mother    Breast cancer Mother    Arthritis Mother    Hypertension Other        grandmother   Diabetes Other        grandmother   Stroke Other        grandfather   Breast cancer Other        grandmother, late 62s   Diabetes Maternal Grandmother    Breast cancer Maternal Grandmother      ROS:  Please see the history of present  illness.   All other ROS reviewed and negative.     Physical Exam/Data: Vitals:   09/25/24 2015 09/25/24 2030 09/25/24 2045 09/25/24 2100  BP: 121/82 (!) 101/90    Pulse: 89 90 91 94  Resp: 15 (!) 22 (!) 23 12  Temp:      TempSrc:      SpO2: 99% 99% 98% 100%  Weight:      Height:       No intake or output data in the 24 hours ending 09/25/24 2117    09/25/2024    5:10 PM 07/27/2024    8:43 AM 04/14/2024    8:14 AM  Last 3 Weights  Weight (lbs) 280 lb 289 lb 280 lb 6.4 oz  Weight (kg) 127.007 kg 131.09 kg 127.189 kg     Body mass index is 47.32 kg/m.  General:  Well nourished, well developed, in no acute distress HEENT: normal Neck: no JVD Vascular: No carotid bruits; Distal pulses 2+ bilaterally Cardiac:  normal S1, S2; RRR; no murmur  Lungs:  clear to auscultation bilaterally, no wheezing, rhonchi or rales  Abd: soft, nontender, no hepatomegaly  Ext: no edema Musculoskeletal:  No deformities, BUE and BLE strength normal and equal Skin: warm and dry  Neuro:  CNs 2-12 intact, no focal abnormalities noted Psych:  Normal affect     Laboratory Data: High Sensitivity Troponin:  No results for input(s): TROPONINIHS in the last 720 hours.   Chemistry Recent Labs  Lab 09/25/24 1725  NA 135  K 3.8  CL 98  CO2 23  GLUCOSE 288*  BUN 17  CREATININE 0.86  CALCIUM 9.7  MG 1.6*  GFRNONAA >60  ANIONGAP 14    No results for input(s): PROT, ALBUMIN, AST,  ALT, ALKPHOS, BILITOT in the last 168 hours. Lipids No results for input(s): CHOL, TRIG, HDL, LABVLDL, LDLCALC, CHOLHDL in the last 168 hours.  Hematology Recent Labs  Lab 09/25/24 1725  WBC 14.9*  RBC 4.35  HGB 13.4  HCT 39.5  MCV 90.8  MCH 30.8  MCHC 33.9  RDW 12.4  PLT 438*   Thyroid  No results for input(s): TSH, FREET4 in the last 168 hours.  BNPNo results for input(s): BNP, PROBNP in the last 168 hours.  DDimer No results for input(s): DDIMER in the last 168 hours.  Radiology/Studies:  DG Chest Port 1 View Result Date: 09/25/2024 CLINICAL DATA:  Chest pain EXAM: PORTABLE CHEST 1 VIEW COMPARISON:  Chest x-ray 07/16/2023 FINDINGS: The heart size and mediastinal contours are within normal limits. Both lungs are clear. The visualized skeletal structures are unremarkable. IMPRESSION: No active disease. Electronically Signed   By: Greig Pique M.D.   On: 09/25/2024 18:40     Assessment and Plan: Idiopathic VT s/p external shock (H/o Idiopathic VT back in 2024 at the time of profound hypothyroidism) H/o hypothyroidism Moderate pericardial effusion- unclear etiology (previously attributed to hypothyrodism but persisted) Obesity   Plan: -- EP Consult in am. Do not suspect CAD or ACS as a cause of VT. 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 Does not appear to be Fasicular either.  -> Start metoprolol  xl 25mg  BID to suppress HR.  Holding off Amiodarone unless it recurs. Keep her NPO after MN for EP eval and consider VT ablation in am -> Repeat ECHO: May need to consider diagnostic tap to see why she has persistent effusion since 2024. No concerns for tamponade, hemodynamics are stable.  -> Keep K>4,  Mg >2.0. Will follow along   Risk Assessment/Risk Scores: {Complete the following score calculators/questions to meet required metrics.  Press F2         :[id number]}             For questions or updates, please contact  [facility] Please consult [email or web address] for contact info under     Signed, Grayce Bold, MD  09/25/2024 9:17 PM

## 2024-09-25 NOTE — ED Triage Notes (Signed)
 Pt c/o mid chest pressure, elevated HR per smart watch since appx 1600 today.   C/o generalized fatigue when waking this AM.   Dyspnea on exertion. Reports compliance with medications.

## 2024-09-26 ENCOUNTER — Encounter (HOSPITAL_COMMUNITY): Payer: Self-pay | Admitting: Internal Medicine

## 2024-09-26 ENCOUNTER — Inpatient Hospital Stay (HOSPITAL_COMMUNITY)

## 2024-09-26 DIAGNOSIS — I472 Ventricular tachycardia, unspecified: Secondary | ICD-10-CM | POA: Diagnosis not present

## 2024-09-26 DIAGNOSIS — D72829 Elevated white blood cell count, unspecified: Secondary | ICD-10-CM

## 2024-09-26 DIAGNOSIS — R079 Chest pain, unspecified: Secondary | ICD-10-CM

## 2024-09-26 DIAGNOSIS — E119 Type 2 diabetes mellitus without complications: Secondary | ICD-10-CM

## 2024-09-26 DIAGNOSIS — E039 Hypothyroidism, unspecified: Secondary | ICD-10-CM | POA: Diagnosis not present

## 2024-09-26 LAB — GLUCOSE, CAPILLARY
Glucose-Capillary: 181 mg/dL — ABNORMAL HIGH (ref 70–99)
Glucose-Capillary: 209 mg/dL — ABNORMAL HIGH (ref 70–99)
Glucose-Capillary: 226 mg/dL — ABNORMAL HIGH (ref 70–99)

## 2024-09-26 LAB — RAPID URINE DRUG SCREEN, HOSP PERFORMED
Amphetamines: NOT DETECTED
Barbiturates: NOT DETECTED
Benzodiazepines: NOT DETECTED
Cocaine: NOT DETECTED
Opiates: NOT DETECTED
Tetrahydrocannabinol: NOT DETECTED

## 2024-09-26 LAB — COMPREHENSIVE METABOLIC PANEL WITH GFR
ALT: 16 U/L (ref 0–44)
AST: 14 U/L — ABNORMAL LOW (ref 15–41)
Albumin: 3.1 g/dL — ABNORMAL LOW (ref 3.5–5.0)
Alkaline Phosphatase: 55 U/L (ref 38–126)
Anion gap: 9 (ref 5–15)
BUN: 13 mg/dL (ref 6–20)
CO2: 21 mmol/L — ABNORMAL LOW (ref 22–32)
Calcium: 8.4 mg/dL — ABNORMAL LOW (ref 8.9–10.3)
Chloride: 105 mmol/L (ref 98–111)
Creatinine, Ser: 0.82 mg/dL (ref 0.44–1.00)
GFR, Estimated: >60 mL/min (ref >60)
Glucose, Bld: 184 mg/dL — ABNORMAL HIGH (ref 70–99)
Potassium: 3.3 mmol/L — ABNORMAL LOW (ref 3.5–5.1)
Sodium: 135 mmol/L (ref 135–145)
Total Bilirubin: 0.5 mg/dL (ref 0.0–1.2)
Total Protein: 6.4 g/dL — ABNORMAL LOW (ref 6.5–8.1)

## 2024-09-26 LAB — URINALYSIS, COMPLETE (UACMP) WITH MICROSCOPIC
Bilirubin Urine: NEGATIVE
Glucose, UA: 50 mg/dL — AB
Hgb urine dipstick: NEGATIVE
Ketones, ur: NEGATIVE mg/dL
Leukocytes,Ua: NEGATIVE
Nitrite: NEGATIVE
Protein, ur: 30 mg/dL — AB
Specific Gravity, Urine: 1.029 (ref 1.005–1.030)
pH: 5 (ref 5.0–8.0)

## 2024-09-26 LAB — TSH
TSH: 14.9 u[IU]/mL — ABNORMAL HIGH (ref 0.350–4.500)
TSH: 13.25 u[IU]/mL — ABNORMAL HIGH (ref 0.350–4.500)

## 2024-09-26 LAB — ECHOCARDIOGRAM COMPLETE
Area-P 1/2: 3.99 cm2
Height: 64.5 in
S' Lateral: 2.6 cm
Weight: 4582.04 [oz_av]

## 2024-09-26 LAB — TROPONIN I (HIGH SENSITIVITY): Troponin I (High Sensitivity): 20 ng/L — ABNORMAL HIGH (ref <18)

## 2024-09-26 LAB — HEPARIN LEVEL (UNFRACTIONATED): Heparin Unfractionated: <0.1 [IU]/mL — ABNORMAL LOW (ref 0.30–0.70)

## 2024-09-26 LAB — BRAIN NATRIURETIC PEPTIDE: B Natriuretic Peptide: 90.1 pg/mL (ref 0.0–100.0)

## 2024-09-26 LAB — MAGNESIUM: Magnesium: 1.9 mg/dL (ref 1.7–2.4)

## 2024-09-26 MED ORDER — MELATONIN 3 MG PO TABS
3.0000 mg | ORAL_TABLET | Freq: Every evening | ORAL | Status: DC | PRN
  Administered 2024-09-26 – 2024-09-28 (×4): 3 mg via ORAL
  Filled 2024-09-26 (×4): qty 1

## 2024-09-26 MED ORDER — ENSURE PLUS HIGH PROTEIN PO LIQD: 237.0000 mL | Freq: Two times a day (BID) | ORAL | Status: DC

## 2024-09-26 MED ORDER — MAGNESIUM SULFATE 2 GM/50ML IV SOLN
2.0000 g | Freq: Once | INTRAVENOUS | Status: AC
  Administered 2024-09-26: 2 g via INTRAVENOUS
  Filled 2024-09-26: qty 50

## 2024-09-26 MED ORDER — ACETAMINOPHEN 650 MG RE SUPP: 650.0000 mg | Freq: Four times a day (QID) | RECTAL | Status: DC | PRN

## 2024-09-26 MED ORDER — BUPROPION HCL ER (XL) 150 MG PO TB24
150.0000 mg | ORAL_TABLET | Freq: Every day | ORAL | Status: DC
  Administered 2024-09-26 – 2024-09-29 (×4): 150 mg via ORAL
  Filled 2024-09-26 (×4): qty 1

## 2024-09-26 MED ORDER — ACETAMINOPHEN 325 MG PO TABS: 650.0000 mg | ORAL_TABLET | Freq: Four times a day (QID) | ORAL | Status: DC | PRN

## 2024-09-26 MED ORDER — INSULIN ASPART 100 UNIT/ML IJ SOLN
0.0000 [IU] | Freq: Four times a day (QID) | INTRAMUSCULAR | Status: DC
  Administered 2024-09-26: 2 [IU] via SUBCUTANEOUS
  Administered 2024-09-26 (×2): 3 [IU] via SUBCUTANEOUS
  Administered 2024-09-27 – 2024-09-29 (×8): 2 [IU] via SUBCUTANEOUS
  Filled 2024-09-26 (×2): qty 2
  Filled 2024-09-26: qty 3
  Filled 2024-09-26 (×6): qty 2
  Filled 2024-09-26: qty 3

## 2024-09-26 MED ORDER — PERFLUTREN LIPID MICROSPHERE
1.0000 mL | INTRAVENOUS | Status: AC | PRN
  Administered 2024-09-26: 2 mL via INTRAVENOUS

## 2024-09-26 MED ORDER — SERTRALINE HCL 100 MG PO TABS
100.0000 mg | ORAL_TABLET | Freq: Every day | ORAL | Status: DC
  Administered 2024-09-26 – 2024-09-29 (×4): 100 mg via ORAL
  Filled 2024-09-26 (×4): qty 1

## 2024-09-26 MED ORDER — POTASSIUM CHLORIDE CRYS ER 20 MEQ PO TBCR
40.0000 meq | EXTENDED_RELEASE_TABLET | ORAL | Status: AC
  Administered 2024-09-26 (×2): 40 meq via ORAL
  Filled 2024-09-26 (×2): qty 2

## 2024-09-26 MED ORDER — METOPROLOL SUCCINATE ER 50 MG PO TB24
50.0000 mg | ORAL_TABLET | Freq: Two times a day (BID) | ORAL | Status: DC
  Administered 2024-09-26 – 2024-09-27 (×3): 50 mg via ORAL
  Filled 2024-09-26 (×3): qty 1

## 2024-09-26 MED ORDER — METOPROLOL SUCCINATE ER 25 MG PO TB24: 25.0000 mg | ORAL_TABLET | Freq: Two times a day (BID) | ORAL | Status: DC

## 2024-09-26 NOTE — Care Plan (Signed)
 This 43 yrs old female with PMH significant for ventricular tachycardia, Chronic diastolic heart failure, Hypothyroidism, type 2 diabetes presented in the ED with an episode of ventricular tachycardia status post cardioversion to sinus rhythm.  She reports having chest pressure associated with palpitations.  She reports having similar presentation in September 2020 where she was found to be in V. tach in the setting of large pericardial effusion felt to be due to the hypothyroidism.  EDP discussed the case with cardiologist recommended amiodarone infusion if patient has subsequent runs of V. tach.  Cardiology thinks the presentation is less suggestive of ACS so heparin  was discontinued.  She reports feeling better,  denies any palpitations.  Patient started on metoprolol  XL 25 mg twice daily.  Amiodarone infusion was also kept on hold unless it recurs.  Echocardiogram completed shows LVEF 65 to 70%, small pericardial effusion.  Cardiology will continue to follow. Patient was seen and examined at bedside.  Denies any chest pain,  palpitations,  dizziness.

## 2024-09-26 NOTE — Plan of Care (Signed)

## 2024-09-26 NOTE — Inpatient Diabetes Management (Signed)
 Inpatient Diabetes Program Recommendations  AACE/ADA: New Consensus Statement on Inpatient Glycemic Control (2015)  Target Ranges:  Prepandial:   less than 140 mg/dL      Peak postprandial:   less than 180 mg/dL (1-2 hours)      Critically ill patients:  140 - 180 mg/dL   Lab Results  Component Value Date   GLUCAP 209 (H) 09/26/2024   HGBA1C 7.8 (H) 07/27/2024    Review of Glycemic Control  Latest Reference Range & Units 09/26/24 05:12 09/26/24 11:35  Glucose-Capillary 70 - 99 mg/dL 818 (H) 790 (H)   Diabetes history: DM 2 Outpatient Diabetes medications:  Moujaro 5 mg weekly Current orders for Inpatient glycemic control:  Novolog  0-9 units q 6 hours  Inpatient Diabetes Program Recommendations:    May consider changing Novolog  correction to q 4 hours (instead of q 6 hours).  Also consider adding CHO modified to diet.    Thanks,  Randall Bullocks, RN, BC-ADM Inpatient Diabetes Coordinator Pager [phone]  (8a-5p)

## 2024-09-26 NOTE — Plan of Care (Signed)

## 2024-09-26 NOTE — Consult Note (Signed)
 Cardiology Consultation   Patient ID: Joanne Harris MRN: [id number]; DOB: Oct 15, 1981  Admit date: 09/25/2024 Date of Consult: 09/25/2024  PCP:  Mahlon Comer BRAVO, MD   [facility] Providers Cardiologist:  Darryle ONEIDA Decent, MD  Electrophysiologist:  Fonda Kitty, MD       Patient Profile: Joanne Harris is a 43 y.o. 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  History of Present Illness: Joanne Harris is a 43 y.o. 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  Patient presented to ER with c/o chest pressure, tightness and palpitations that began today, no syncope. In the ER  EKG shows regular WCT at HR 172bpm, RBBB pattern, there is a capture beat and could be also a fusion beat- points towards VT. This EKG is similar to 06/2023 when she had idiopathic VT.  Pt underwent synchronized DCCV in the ER and Cardiology was called for further recs.  Pt is being transferred to [facility] and being admitted to Medicine service.  Trop is 18-> 21 Repeat EKG after DCCV shows Sinus tachy, PVC at HR 106bpm    Prior Cardiac detailed work up as below: ECHO: 11/2023  1. Left ventricular ejection fraction, by estimation, is 60 to 65%. The  left ventricle has normal function. The left ventricle has no regional  wall motion abnormalities. Left ventricular diastolic parameters are  consistent with Grade I diastolic  dysfunction (impaired relaxation). The average left ventricular global  longitudinal strain is -20.6 %. The global longitudinal strain is normal.   2. Right ventricular systolic function  is normal. The right ventricular  size is normal. Tricuspid regurgitation signal is inadequate for assessing  PA pressure.   3. The mitral valve is normal in structure. Trivial mitral valve  regurgitation. No evidence of mitral stenosis.   4. The aortic valve is tricuspid. Aortic valve regurgitation is not  visualized. No aortic stenosis is present.   5. The IVC was not visualized.   6. Moderate pericardial effusion present primarily towards the base of  the heart (adjacent to the atria). There is no evidence of cardiac  tamponade.   Live Cardiac Monitor 06/2023:  Total Patch Wear Time:  5 days and 1 hours (2024-10-02T19:49:51-0400 to 2024-09-26T22:07:00-0400)   Monitor 1 HR 72 - 113, average 79 bpm. No atrial fibrillation detected. Rare supraventricular ectopy. Rare ventricular ectopy. No sustained arrhythmias. Symptom trigger episodes correspond to normal sinus rhythm.   Monitor 2 HR 66 - 143, average 75 bpm. 13 nonsustained VT (longest 5 beats) No atrial fibrillation detected. Rare supraventricular ectopy. Frequent ventricular ectopy, 5%. No sustained arrhythmias. Symptom trigger episodes correspond to PVCs.   RHC/LHC 07/19/23:  Conclusions: No angiographically significant coronary artery disease. Mildly to moderately elevated left heart and pulmonary artery pressures. Severely elevated right heart filling pressures. Normal Fick cardiac output/index. Hemodynamics with equivocal findings of cardiac tamponade (absent y-descent noted on RA pressure tracing, elevated right heart pressures not completely equalized with left heart pressures, and concordant LV/RV pressures).   Recommendations: Proceed with cardiac MRI. Favor deferring drainage of pericardial effusion at this time, given lack of symptoms and equivocal  hemodynamic findings of tamponade physiology. Primary prevention of coronary artery disease. Cardiac MRI 07/20/23:  IMPRESSION: 1. Initial study on 07/20/23 was  very poor quality. She was brought back for repeat study on 07/21/23. Issue on initial study was that EKG gating was done but scanner was having difficulty detecting QRS complex due to low voltage, leading to inaccurate gating. For second study, switched to pulse triggering using pulse oximeter with significant improvement in image quality   2. Large pericardial effusion measuring up to 4cm adjacent to LV lateral wall. No evidence of ventricular interdependence on real time imaging   3. Myocardial T2 values are diffusely elevated, suggesting significant myocardial edema. In addition, there is marked elevation of ECV (40%) and patchy midwall LGE   4. Small biventricular size with normal systolic function. Asymmetric LV hypertrophy measuring 14mm in basal septum (10mm in posterior wall), not meeting criteria for HCM (<50mm)   5. Suspect findings (elevated T1/T2/ECV, LGE, pericardial effusion) are due to severe hypothyroidism. ECV however is higher than typically expected, and is on the border of the expected range for amyloid. Early amyloid is on the differential given the LVH, ECV, and LGE findings, though suspect more likely hypothyroidism as cause. Recommend checking SPEP/UPEP/light chains and PYP scan to evaluate for cardiac amyloid, but if unremarkable suspect hypothyroidism as etiology of findings and would suggest repeating cardiac MRI once thyroid  disease has been treated   Amyloid workup was negative. She wore a cardiac monitor for 5 days after hospital discharge and did not have any sustained arrhythmias.      Past Medical History:  Diagnosis Date   Depression    Diabetes mellitus    Hypothyroid    Leukocytosis 01/25/2020   Obesity    Thrombocytosis 01/25/2020    Past Surgical History:  Procedure Laterality Date   CHOLECYSTECTOMY     RIGHT/LEFT HEART CATH AND CORONARY ANGIOGRAPHY N/A 07/19/2023   Procedure: RIGHT/LEFT HEART CATH AND CORONARY ANGIOGRAPHY;  Surgeon:  Mady Bruckner, MD;  Location: [facility] INVASIVE CV LAB;  Service: Cardiovascular;  Laterality: N/A;     Home Medications:  Prior to Admission medications   Medication Sig Start Date End Date Taking? Authorizing Provider  acetaminophen  (TYLENOL ) 325 MG tablet Take 2 tablets (650 mg total) by mouth every 6 (six) hours as needed for mild pain, fever or headache. 07/21/23   Danton Reyes DASEN, MD  glucose blood (ONETOUCH VERIO) test strip Use to test blood sugar 2 times daily as instructed. 11/05/14   Trixie File, MD  levothyroxine  (SYNTHROID ) 200 MCG tablet Take 1 tablet (200 mcg total) by mouth daily. 04/17/24   Tabori, Katherine E, MD  Melatonin 10 MG CHEW Chew 10 mg by mouth at bedtime.    [provider]  metoprolol  succinate (TOPROL -XL) 25 MG 24 hr tablet Take 1 tablet (25 mg total) by mouth at bedtime. 08/11/23   O'Neal, Darryle Ned, MD  ondansetron  (ZOFRAN ) 4 MG tablet Take 1 tablet (4 mg total) by mouth every 8 (eight) hours as needed for nausea or vomiting. 07/28/23   Tabori, Katherine E, MD  [facility] DELICA LANCETS FINE MISC Use to test blood sugar 2 times daily as instructed. 11/05/14   Trixie File, MD  Sertraline  HCl 150 MG CAPS Take 1 capsule (150 mg total) by mouth daily. 07/27/24   Tabori, Katherine E, MD  spironolactone  (ALDACTONE ) 25 MG tablet Take 1 tablet (25 mg total) by mouth daily. 08/11/23   O'NealDarryle Ned, MD  tirzepatide  (MOUNJARO ) 5 MG/0.5ML Pen  Inject 5 mg into the skin once a week. 01/14/24   Tabori, Katherine E, MD  Vitamin D , Cholecalciferol, 25 MCG (1000 UT) TABS Take 3 tablets by mouth once. 3000mg  07/29/23   [provider]    Scheduled Meds:  Continuous Infusions:  heparin  1,300 Units/hr (09/25/24 1903)   PRN Meds:   Allergies:   No Known Allergies  Social History:   Social History   Socioeconomic History   Marital status: Single    Spouse name: Not on file   Number of children: Not on file   Years of education: Not on  file   Highest education level: Master's degree (e.g., MA, MS, MEng, MEd, MSW, MBA)  Occupational History   Not on file  Tobacco Use   Smoking status: Never   Smokeless tobacco: Never  Vaping Use   Vaping status: Never Used  Substance and Sexual Activity   Alcohol use: Yes    Comment: occ.   Drug use: No   Sexual activity: Not on file  Other Topics Concern   Not on file  Social History Narrative   Not on file   Social Drivers of Health   Financial Resource Strain: Low Risk  (04/13/2024)   Overall Financial Resource Strain (CARDIA)    Difficulty of Paying Living Expenses: Not hard at all  Food Insecurity: No Food Insecurity (04/13/2024)   Hunger Vital Sign    Worried About Running Out of Food in the Last Year: Never true    Ran Out of Food in the Last Year: Never true  Transportation Needs: No Transportation Needs (04/13/2024)   PRAPARE - Administrator, Civil Service (Medical): No    Lack of Transportation (Non-Medical): No  Physical Activity: Insufficiently Active (04/13/2024)   Exercise Vital Sign    Days of Exercise per Week: 2 days    Minutes of Exercise per Session: 30 min  Stress: Stress Concern Present (04/13/2024)   [employer] of Occupational Health - Occupational Stress Questionnaire    Feeling of Stress: Rather much  Social Connections: Moderately Integrated (04/13/2024)   Social Connection and Isolation Panel    Frequency of Communication with Friends and Family: More than three times a week    Frequency of Social Gatherings with Friends and Family: Once a week    Attends Religious Services: More than 4 times per year    Active Member of [employer] or Organizations: Yes    Attends Engineer, Structural: More than 4 times per year    Marital Status: Never married  Intimate Partner Violence: Not At Risk (07/29/2023)   Humiliation, Afraid, Rape, and Kick questionnaire    Fear of Current or Ex-Partner: No    Emotionally Abused: No    Physically  Abused: No    Sexually Abused: No    Family History:    Family History  Problem Relation Age of Onset   Anemia Mother    Breast cancer Mother    Arthritis Mother    Hypertension Other        grandmother   Diabetes Other        grandmother   Stroke Other        grandfather   Breast cancer Other        grandmother, late 61s   Diabetes Maternal Grandmother    Breast cancer Maternal Grandmother      ROS:  Please see the history of present illness.   All other ROS reviewed and negative.  Physical Exam/Data: Vitals:   09/25/24 2015 09/25/24 2030 09/25/24 2045 09/25/24 2100  BP: 121/82 (!) 101/90    Pulse: 89 90 91 94  Resp: 15 (!) 22 (!) 23 12  Temp:      TempSrc:      SpO2: 99% 99% 98% 100%  Weight:      Height:       No intake or output data in the 24 hours ending 09/25/24 2117    09/25/2024    5:10 PM 07/27/2024    8:43 AM 04/14/2024    8:14 AM  Last 3 Weights  Weight (lbs) 280 lb 289 lb 280 lb 6.4 oz  Weight (kg) 127.007 kg 131.09 kg 127.189 kg     Body mass index is 47.32 kg/m.  General:  Well nourished, well developed, in no acute distress HEENT: normal Neck: no JVD Vascular: No carotid bruits; Distal pulses 2+ bilaterally Cardiac:  normal S1, S2; RRR; no murmur  Lungs:  clear to auscultation bilaterally, no wheezing, rhonchi or rales  Abd: soft, nontender, no hepatomegaly  Ext: no edema Musculoskeletal:  No deformities, BUE and BLE strength normal and equal Skin: warm and dry  Neuro:  CNs 2-12 intact, no focal abnormalities noted Psych:  Normal affect     Laboratory Data: High Sensitivity Troponin:  No results for input(s): TROPONINIHS in the last 720 hours.   Chemistry Recent Labs  Lab 09/25/24 1725  NA 135  K 3.8  CL 98  CO2 23  GLUCOSE 288*  BUN 17  CREATININE 0.86  CALCIUM 9.7  MG 1.6*  GFRNONAA >60  ANIONGAP 14    No results for input(s): PROT, ALBUMIN, AST, ALT, ALKPHOS, BILITOT in the last 168 hours. Lipids No  results for input(s): CHOL, TRIG, HDL, LABVLDL, LDLCALC, CHOLHDL in the last 168 hours.  Hematology Recent Labs  Lab 09/25/24 1725  WBC 14.9*  RBC 4.35  HGB 13.4  HCT 39.5  MCV 90.8  MCH 30.8  MCHC 33.9  RDW 12.4  PLT 438*   Thyroid  No results for input(s): TSH, FREET4 in the last 168 hours.  BNPNo results for input(s): BNP, PROBNP in the last 168 hours.  DDimer No results for input(s): DDIMER in the last 168 hours.  Radiology/Studies:  DG Chest Port 1 View Result Date: 09/25/2024 CLINICAL DATA:  Chest pain EXAM: PORTABLE CHEST 1 VIEW COMPARISON:  Chest x-ray 07/16/2023 FINDINGS: The heart size and mediastinal contours are within normal limits. Both lungs are clear. The visualized skeletal structures are unremarkable. IMPRESSION: No active disease. Electronically Signed   By: Greig Pique M.D.   On: 09/25/2024 18:40     Assessment and Plan: Idiopathic VT s/p external shock (H/o Idiopathic VT back in 2024 at the time of profound hypothyroidism) H/o hypothyroidism Moderate pericardial effusion- unclear etiology (previously attributed to hypothyrodism but persisted) Obesity   Plan: -- EP Consult in am. Do not suspect CAD or ACS as a cause of VT. 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 Does not appear to be Fasicular either.  We can stop the heparin  drip -> Start metoprolol  xl 25mg  BID to suppress HR.  Holding off Amiodarone unless it recurs. Keep her NPO after MN for EP eval and consider VT ablation in am -> Repeat ECHO: May need to consider diagnostic tap to see why she has persistent effusion since 2024. No concerns for tamponade, hemodynamics are stable.  -> Keep K>4, Mg >2.0. Will follow along  Risk Assessment/Risk Scores:             For questions or updates, please contact [facility] Please consult [email or web address] for contact info under     Signed, Grayce Bold, MD  09/25/2024 9:17 PM

## 2024-09-26 NOTE — Progress Notes (Signed)
  Echocardiogram 2D Echocardiogram has been performed.  LAMON MAXWELL 09/26/2024, 9:24 AM

## 2024-09-26 NOTE — Consult Note (Addendum)
 Cardiology Consultation   Patient ID: Joanne Harris MRN: [id number]; DOB: 05/23/1981  Admit date: 09/25/2024 Date of Consult: 09/26/2024  PCP:  Mahlon Comer BRAVO, MD   [facility] Providers Cardiologist:  Darryle ONEIDA Decent, MD  Electrophysiologist:  Fonda Kitty, MD    Patient Profile: Joanne Harris is a 43 y.o. 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 Leotis.  History of Present Illness: [name] has been feeling well, taking her medicines with no recent illness, new medications or changes in day to day life. Yesterday morning while preparing for work, felt unusually fatigued, but it passed and she went to work.  Her day went as usual, feeling well though suddenly in later afternoon started to feel a bit winded, heavy in her chest and noted fast HR, seemed to at firs come/go, though then persisted.   Given her hx, she came in  Found in VT and did get cardioverted successfully to SR No glaring lab derangements Transferred to [facility] for admission and further w/u  LABS K+ 3.8 > 3.3 (replacement being addressed by attending) Mag 1.6 > 1.9 BUN/Creat 17/0.86 > 0.82 HS Trop 18, 21, Topr I 20 BNP 90.1 WBC 14.9 (no fever) H/H 13/39 Plts 438  She feels well currently, no ongoing or recurrent palpitations, no CP, SOB   Past Medical History:  Diagnosis Date   Depression    Diabetes mellitus    Hypothyroid    Leukocytosis 01/25/2020   Obesity    Thrombocytosis 01/25/2020    Past Surgical History:  Procedure Laterality Date   CHOLECYSTECTOMY     RIGHT/LEFT HEART CATH AND CORONARY ANGIOGRAPHY N/A 07/19/2023   Procedure: RIGHT/LEFT HEART CATH AND CORONARY ANGIOGRAPHY;  Surgeon: Mady Bruckner, MD;  Location: [facility] INVASIVE CV LAB;  Service: Cardiovascular;  Laterality: N/A;     Home Medications:  Prior to Admission  medications   Medication Sig Start Date End Date Taking? Authorizing Provider  acetaminophen  (TYLENOL ) 325 MG tablet Take 2 tablets (650 mg total) by mouth every 6 (six) hours as needed for mild pain, fever or headache. 07/21/23   Danton Reyes ONEIDA, MD  glucose blood (ONETOUCH VERIO) test strip Use to test blood sugar 2 times daily as instructed. 11/05/14   Trixie File, MD  levothyroxine  (SYNTHROID ) 200 MCG tablet Take 1 tablet (200 mcg total) by mouth daily. 04/17/24   Tabori, Katherine E, MD  Melatonin 10 MG CHEW Chew 10 mg by mouth at bedtime.    [provider]  metoprolol  succinate (TOPROL -XL) 25 MG 24 hr tablet Take 1 tablet (25 mg total) by mouth at bedtime. 08/11/23   O'Neal, Darryle Ned, MD  ondansetron  (ZOFRAN ) 4 MG tablet Take 1 tablet (4 mg total) by mouth every 8 (eight) hours as needed for nausea or vomiting. 07/28/23   Tabori, Katherine E, MD  [facility] DELICA LANCETS FINE MISC Use to test blood sugar 2 times daily as instructed. 11/05/14   Trixie File, MD  Sertraline  HCl 150 MG CAPS Take 1 capsule (150 mg total) by mouth daily. 07/27/24   Tabori, Katherine E, MD  spironolactone  (ALDACTONE ) 25 MG tablet Take 1 tablet (25 mg total) by mouth daily. 08/11/23   O'NealDarryle Ned, MD  tirzepatide  (MOUNJARO ) 5 MG/0.5ML Pen Inject 5 mg into the skin once a week. 01/14/24   Tabori, Katherine E, MD  Vitamin  D, Cholecalciferol, 25 MCG (1000 UT) TABS Take 3 tablets by mouth once. 3000mg  07/29/23   [provider]    Scheduled Meds:  feeding supplement  237 mL Oral BID BM   insulin  aspart  0-9 Units Subcutaneous Q6H   metoprolol  succinate  50 mg Oral BID   potassium chloride   40 mEq Oral Q4H   Continuous Infusions:  magnesium  sulfate bolus IVPB     PRN Meds: acetaminophen  **OR** acetaminophen , melatonin, perflutren  lipid microspheres (DEFINITY ) IV suspension  Allergies:   No Known Allergies  Social History:   Social History   Socioeconomic History    Marital status: Single    Spouse name: Not on file   Number of children: Not on file   Years of education: Not on file   Highest education level: Master's degree (e.g., MA, MS, MEng, MEd, MSW, MBA)  Occupational History   Not on file  Tobacco Use   Smoking status: Never   Smokeless tobacco: Never  Vaping Use   Vaping status: Never Used  Substance and Sexual Activity   Alcohol use: Yes    Comment: occ.   Drug use: No   Sexual activity: Not on file  Other Topics Concern   Not on file  Social History Narrative   Not on file   Social Drivers of Health   Financial Resource Strain: Low Risk  (04/13/2024)   Overall Financial Resource Strain (CARDIA)    Difficulty of Paying Living Expenses: Not hard at all  Food Insecurity: No Food Insecurity (09/26/2024)   Hunger Vital Sign    Worried About Running Out of Food in the Last Year: Never true    Ran Out of Food in the Last Year: Never true  Transportation Needs: No Transportation Needs (09/26/2024)   PRAPARE - Administrator, Civil Service (Medical): No    Lack of Transportation (Non-Medical): No  Physical Activity: Insufficiently Active (04/13/2024)   Exercise Vital Sign    Days of Exercise per Week: 2 days    Minutes of Exercise per Session: 30 min  Stress: Stress Concern Present (04/13/2024)   [employer] of Occupational Health - Occupational Stress Questionnaire    Feeling of Stress: Rather much  Social Connections: Moderately Integrated (04/13/2024)   Social Connection and Isolation Panel    Frequency of Communication with Friends and Family: More than three times a week    Frequency of Social Gatherings with Friends and Family: Once a week    Attends Religious Services: More than 4 times per year    Active Member of [employer] or Organizations: Yes    Attends Banker Meetings: More than 4 times per year    Marital Status: Never married  Intimate Partner Violence: Not At Risk (09/26/2024)   Humiliation,  Afraid, Rape, and Kick questionnaire    Fear of Current or Ex-Partner: No    Emotionally Abused: No    Physically Abused: No    Sexually Abused: No    Family History:   Family History  Problem Relation Age of Onset   Anemia Mother    Breast cancer Mother    Arthritis Mother    Hypertension Other        grandmother   Diabetes Other        grandmother   Stroke Other        grandfather   Breast cancer Other        grandmother, late 2s   Diabetes Maternal Grandmother  Breast cancer Maternal Grandmother      ROS:  Please see the history of present illness.  All other ROS reviewed and negative.     Physical Exam/Data: Vitals:   09/26/24 0513 09/26/24 0536 09/26/24 0800 09/26/24 0951  BP: 115/61   115/61  Pulse: 86  87 87  Resp: 18     Temp: 98.5 F (36.9 C)     TempSrc: Oral     SpO2: 98%  100%   Weight:  129.9 kg    Height:        Intake/Output Summary (Last 24 hours) at 09/26/2024 0958 Last data filed at 09/26/2024 0537 Gross per 24 hour  Intake 208.97 ml  Output --  Net 208.97 ml      09/26/2024    5:36 AM 09/26/2024   12:10 AM 09/25/2024    5:10 PM  Last 3 Weights  Weight (lbs) 286 lb 6 oz 286 lb 6 oz 280 lb  Weight (kg) 129.9 kg 129.9 kg 127.007 kg     Body mass index is 48.4 kg/m.  General:  Well nourished, well developed, in no acute distress HEENT: normal Neck: no JVD Vascular: No carotid bruits; Distal pulses 2+ bilaterally Cardiac:  RRR; no murmurs, gallops or rubs Lungs:  CAT b/l, no wheezing, rhonchi or rales  Abd: soft, nontender Ext: no edema Musculoskeletal:  No deformities Skin: warm and dry  Neuro:  no gross focal abnormalities noted Psych:  Normal affect   EKG:  The EKG was personally reviewed and demonstrates:  VT, 172bpm, RBBB ST 106bpm, one PVC  Telemetry:  Telemetry was personally reviewed and demonstrates:   SR 80's - 90's  Relevant CV Studies:  09/26/24: TTE 1. Left ventricular ejection fraction, by estimation, is 60 to  65%. The  left ventricle has normal function. The left ventricle has no regional  wall motion abnormalities. There is mild concentric left ventricular  hypertrophy. Left ventricular diastolic  parameters were normal.   2. Right ventricular systolic function is normal. The right ventricular  size is normal.   3. A small pericardial effusion is present. The pericardial effusion is  circumferential. There is no evidence of cardiac tamponade.   4. The mitral valve is normal in structure. No evidence of mitral valve  regurgitation. No evidence of mitral stenosis.   5. The aortic valve is normal in structure. Aortic valve regurgitation is  not visualized. No aortic stenosis is present.   6. The inferior vena cava is normal in size with greater than 50%  respiratory variability, suggesting right atrial pressure of 3 mmHg.    Monitor 1 HR 72 - 113, average 79 bpm. No atrial fibrillation detected. Rare supraventricular ectopy. Rare ventricular ectopy. No sustained arrhythmias. Symptom trigger episodes correspond to normal sinus rhythm.   Monitor 2 HR 66 - 143, average 75 bpm. 13 nonsustained VT (longest 5 beats) No atrial fibrillation detected. Rare supraventricular ectopy. Frequent ventricular ectopy, 5%. No sustained arrhythmias. Symptom trigger episodes correspond to PVCs.   RHC/LHC 07/19/23:  Conclusions: No angiographically significant coronary artery disease. Mildly to moderately elevated left heart and pulmonary artery pressures. Severely elevated right heart filling pressures. Normal Fick cardiac output/index. Hemodynamics with equivocal findings of cardiac tamponade (absent y-descent noted on RA pressure tracing, elevated right heart pressures not completely equalized with left heart pressures, and concordant LV/RV pressures). Recommendations: Proceed with cardiac MRI. Favor deferring drainage of pericardial effusion at this time, given lack of symptoms and equivocal  hemodynamic findings of tamponade  physiology. Primary prevention of coronary artery disease.   Echo 07/17/23:  1. Limited echo windows- Definity contrast was not administered. Left  ventricular ejection fraction, by estimation, is 60 to 65%. The left  ventricle has normal function. The left ventricle has no regional wall  motion abnormalities. There is moderate  left ventricular hypertrophy. Left ventricular diastolic parameters are  consistent with Grade I diastolic dysfunction (impaired relaxation).   2. Poorly visualized, appears underfilled. RA and RV collapse noted. RVEF  is reduced. Right ventricular systolic function is moderately reduced. The  right ventricular size is small. There is normal pulmonary artery systolic  pressure. The estimated right   ventricular systolic pressure is 11.7 mmHg.   3. Cannot rule-out tamponade physiology. Large pericardial effusion. The  pericardial effusion is circumferential.   4. The mitral valve is grossly normal. Trivial mitral valve  regurgitation.   5. The aortic valve was not well visualized. Aortic valve regurgitation  is not visualized. No aortic stenosis is present.   6. IVC is poorly visualized, but does not appear dilated.    Cardiac MRI 07/20/23:  IMPRESSION: 1. Initial study on 07/20/23 was very poor quality. She was brought back for repeat study on 07/21/23. Issue on initial study was that EKG gating was done but scanner was having difficulty detecting QRS complex due to low voltage, leading to inaccurate gating. For second study, switched to pulse triggering using pulse oximeter with significant improvement in image quality   2. Large pericardial effusion measuring up to 4cm adjacent to LV lateral wall. No evidence of ventricular interdependence on real time imaging   3. Myocardial T2 values are diffusely elevated, suggesting significant myocardial edema. In addition, there is marked elevation of ECV (40%) and patchy midwall  LGE   4. Small biventricular size with normal systolic function. Asymmetric LV hypertrophy measuring 14mm in basal septum (10mm in posterior wall), not meeting criteria for HCM (<72mm)   5. Suspect findings (elevated T1/T2/ECV, LGE, pericardial effusion) are due to severe hypothyroidism. ECV however is higher than typically expected, and is on the border of the expected range for amyloid. Early amyloid is on the differential given the LVH, ECV, and LGE findings, though suspect more likely hypothyroidism as cause. Recommend checking SPEP/UPEP/light chains and PYP scan to evaluate for cardiac amyloid, but if unremarkable suspect hypothyroidism as etiology of findings and would suggest repeating cardiac MRI once thyroid  disease has been treated   Laboratory Data: High Sensitivity Troponin:   Recent Labs  Lab 09/26/24 0439  TROPONINIHS 20*     Chemistry Recent Labs  Lab 09/25/24 1725 09/26/24 0431  NA 135 135  K 3.8 3.3*  CL 98 105  CO2 23 21*  GLUCOSE 288* 184*  BUN 17 13  CREATININE 0.86 0.82  CALCIUM 9.7 8.4*  MG 1.6* 1.9  GFRNONAA >60 >60  ANIONGAP 14 9    Recent Labs  Lab 09/26/24 0431  PROT 6.4*  ALBUMIN 3.1*  AST 14*  ALT 16  ALKPHOS 55  BILITOT 0.5   Lipids No results for input(s): CHOL, TRIG, HDL, LABVLDL, LDLCALC, CHOLHDL in the last 168 hours.  Hematology Recent Labs  Lab 09/25/24 1725  WBC 14.9*  RBC 4.35  HGB 13.4  HCT 39.5  MCV 90.8  MCH 30.8  MCHC 33.9  RDW 12.4  PLT 438*   Thyroid   Recent Labs  Lab 09/25/24 1723  TSH 14.900*    BNP Recent Labs  Lab 09/26/24 0431  BNP 90.1  DDimer No results for input(s): DDIMER in the last 168 hours.  Radiology/Studies:   DG Chest Port 1 View Result Date: 09/25/2024 CLINICAL DATA:  Chest pain EXAM: PORTABLE CHEST 1 VIEW COMPARISON:  Chest x-ray 07/16/2023 FINDINGS: The heart size and mediastinal contours are within normal limits. Both lungs are clear. The visualized  skeletal structures are unremarkable. IMPRESSION: No active disease. Electronically Signed   By: Greig Pique M.D.   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  Of late her numbers have not been perfect they continue to work on it  TSH has come back 14.9 today Deferred to IM management  3. Pericardial effusion Described as small on today's echo   For questions or updates, please contact [facility] Please consult [email or web address] for contact info under    Signed, Charlies Macario Arthur, PA-C  09/26/2024 9:58 AM  I have seen and examined this patient with Charlies Arthur.  Agree with above, note added to reflect my findings.  With a past medical history as above.  She presented to the hospital with palpitations and shortness of breath.  Earlier in the day yesterday, she had approximately 15 minutes where she felt significant fatigue.  She went to work, and at the end of work, developed her palpitations.  On presentation, she was found to be in ventricular tachycardia.  She was successfully cardioverted to sinus rhythm.  Currently she feels well and is without complaint.  She has had prior admission to the hospital with ventricular tachycardia in the setting of profound hypothyroidism.  GEN: No acute distress.   Neck: No JVD Cardiac: Regular rhythm Respiratory: Normal work of breathing GI: Soft, nontender, non-distended  MS: No edema Neuro:  Nonfocal  Skin: warm and dry Psych: Normal affect    Ventricular tachycardia: Tachycardia is monomorphic.  It is similar to her VT September 2024.  In September, this was in the setting of profound hypothyroidism.  Her TSH is elevated at 14 today.   She has been cardioverted.  Loriann Bosserman increase Toprol -XL to 50 mg twice daily. Hypothyroidism: Patient reports medication compliance.  TSH is still elevated.  Plan per primary team. Pericardial effusion: Described as small on today's echo.  Continue monitoring.  Lenford Beddow M. Laquitta Dominski MD 09/26/2024 10:42 AM

## 2024-09-26 NOTE — H&P (Signed)
 History and Physical      Joanne Harris FMW:[id number] DOB: 05-22-1981 DOA: 09/25/2024; DOS: 09/26/2024  PCP: Mahlon Comer BRAVO, MD  Patient coming from: home   I have personally briefly reviewed patient's old medical records in [facility]  Chief Complaint: Chest pressure  HPI: Joanne Harris is a 43 y.o. female with medical history significant for ventricular tachycardia in September 2020, chronic diastolic heart failure, hypothyroidism, type 2 diabetes mellitus, who is admitted to [facility] on 09/25/2024 by way of transfer from [facility] with episode of ventricular tachycardia status post electrocardioversion to sinus rhythm after presenting from home to the latter facility complaining of chest pressure.  Patient presented to [facility] this evening complaining of episode of chest pressure associated palpitations.  Denies any associated shortness of breath or subjective fever.  She notes similar symptoms leading up to hospitalization in September 2020 show she was found to be in V. tach in the setting of large pericardial effusion felt to be due to hypothyroidism.  Her outpatient medications include metoprolol  succinate 25 mg p.o. daily.  Most recent echo occurred in February 2025 was notable for LVEF 60 to 65%, no evidence of focal motion or maladies, grade 1 diastolic function, normal right ventricular stock function, trivial MR as well as moderate pericardial effusion.  Per chart review, she underwent right and left heart cath in September 2024.   [facility] ED Course:  Vital signs in the ED were notable for the following: Presented to [facility] and V. tach, awake, alert, with associated heart rates in the 170s, she was subsequent electrocardioverted to sinus rhythm with ensuing heart rates in the 80s to 90s; her systolic pressures been 1 teens 150; afebrile; respiratory rate 15-23, oxygen saturation 95 to 100%.   She notes resolution of her chest pressure following electrocardioversion to sinus rhythm.  Labs were notable for the following: BMP notable for potassium 3.8, bicarbonate 23, creatinine 0.86.  I sensitive troponin I was 18, followed by 21.  Magnesium  level 1.6 per CBC 8 over will was about 14,900.  TSH pending.  Per my interpretation, EKG in ED demonstrated the following: Postcardioversion EKG showed sinus tachycardia with single PVC, heart rate 106, QTc was 456, and there is no evidence of T wave or ST changes, Cle[place] no evidence of ST ovation.  Imaging in the ED, per corresponding formal radiology read, was notable for the following: 1 view chest x-ray showed no evidence of acute cardiopulmonary process.  EDP discussed patient's case with on-call cardiology fellow, Dr. Donnel, who recommended hospitalist admission to [facility] and conveyed that cardiology will formally consult. Cardiology recommended amiodarone infusion if the patient has subsequent run of ventricular tachycardia, has increased home metoprolol  succinate to bid, and requests that pt be kept NPO for EP evaluation in the AM.  Cardiology feels that presentation is less suggestive of ACS, and conveys that it is okay to discontinue heparin  drip at this time.   While in the ED, the following were administered: Heparin  bolus followed by heparin  drip, Lopressor  25 mg p.o. x 1 dose.  Subsequently, the patient was admitted to [facility] for further evaluation management of episode of ventricular tachycardia status post electrocardioversion to sinus rhythm, with presenting labs also notable for hypomagnesemia.     Review of Systems: As per HPI otherwise 10 point review of systems negative.   Past Medical History:  Diagnosis Date   Depression  Diabetes mellitus    Hypothyroid    Leukocytosis 01/25/2020   Obesity    Thrombocytosis 01/25/2020    Past Surgical History:  Procedure Laterality Date   CHOLECYSTECTOMY     RIGHT/LEFT HEART  CATH AND CORONARY ANGIOGRAPHY N/A 07/19/2023   Procedure: RIGHT/LEFT HEART CATH AND CORONARY ANGIOGRAPHY;  Surgeon: Mady Bruckner, MD;  Location: [facility] INVASIVE CV LAB;  Service: Cardiovascular;  Laterality: N/A;    Social History:  reports that she has never smoked. She has never used smokeless tobacco. She reports current alcohol use. She reports that she does not use drugs.   No Known Allergies  Family History  Problem Relation Age of Onset   Anemia Mother    Breast cancer Mother    Arthritis Mother    Hypertension Other        grandmother   Diabetes Other        grandmother   Stroke Other        grandfather   Breast cancer Other        grandmother, late 6s   Diabetes Maternal Grandmother    Breast cancer Maternal Grandmother     Family history reviewed and not pertinent    Prior to Admission medications   Medication Sig Start Date End Date Taking? Authorizing Provider  acetaminophen  (TYLENOL ) 325 MG tablet Take 2 tablets (650 mg total) by mouth every 6 (six) hours as needed for mild pain, fever or headache. 07/21/23   Danton Reyes DASEN, MD  glucose blood (ONETOUCH VERIO) test strip Use to test blood sugar 2 times daily as instructed. 11/05/14   Trixie File, MD  levothyroxine  (SYNTHROID ) 200 MCG tablet Take 1 tablet (200 mcg total) by mouth daily. 04/17/24   Tabori, Katherine E, MD  Melatonin 10 MG CHEW Chew 10 mg by mouth at bedtime.    [provider]  metoprolol  succinate (TOPROL -XL) 25 MG 24 hr tablet Take 1 tablet (25 mg total) by mouth at bedtime. 08/11/23   O'Neal, Darryle Ned, MD  ondansetron  (ZOFRAN ) 4 MG tablet Take 1 tablet (4 mg total) by mouth every 8 (eight) hours as needed for nausea or vomiting. 07/28/23   Tabori, Katherine E, MD  [facility] DELICA LANCETS FINE MISC Use to test blood sugar 2 times daily as instructed. 11/05/14   Trixie File, MD  Sertraline  HCl 150 MG CAPS Take 1 capsule (150 mg total) by mouth daily. 07/27/24   Tabori,  Katherine E, MD  spironolactone  (ALDACTONE ) 25 MG tablet Take 1 tablet (25 mg total) by mouth daily. 08/11/23   O'NealDarryle Ned, MD  tirzepatide  (MOUNJARO ) 5 MG/0.5ML Pen Inject 5 mg into the skin once a week. 01/14/24   Tabori, Katherine E, MD  Vitamin D , Cholecalciferol, 25 MCG (1000 UT) TABS Take 3 tablets by mouth once. 3000mg  07/29/23   [provider]     Objective    Physical Exam: Vitals:   09/25/24 2300 09/25/24 2315 09/26/24 0010 09/26/24 0011  BP: 115/71 113/84  134/85  Pulse: 87 88  93  Resp: 18 (!) 22  20  Temp:    98.9 F (37.2 C)  TempSrc:    Oral  SpO2: 98% 100%  100%  Weight:   129.9 kg   Height:   5' 4.5 (1.638 m)     General: appears to be stated age; alert, oriented Skin: warm, dry, no rash Head:  AT/ Mouth:  Oral mucosa membranes appear moist, normal dentition Neck: supple; trachea midline Heart:  RRR; did  not appreciate any M/R/G Lungs: CTAB, did not appreciate any wheezes, rales, or rhonchi Abdomen: + BS; soft, ND, NT Vascular: 2+ pedal pulses b/l; 2+ radial pulses b/l Extremities: no peripheral edema, no muscle wasting         Labs on Admission: I have personally reviewed following labs and imaging studies  CBC: Recent Labs  Lab 09/25/24 1725  WBC 14.9*  HGB 13.4  HCT 39.5  MCV 90.8  PLT 438*   Basic Metabolic Panel: Recent Labs  Lab 09/25/24 1725  NA 135  K 3.8  CL 98  CO2 23  GLUCOSE 288*  BUN 17  CREATININE 0.86  CALCIUM 9.7  MG 1.6*   GFR: Estimated Creatinine Clearance: 115 mL/min (by C-G formula based on SCr of 0.86 mg/dL). Liver Function Tests: No results for input(s): AST, ALT, ALKPHOS, BILITOT, PROT, ALBUMIN in the last 168 hours. No results for input(s): LIPASE, AMYLASE in the last 168 hours. No results for input(s): AMMONIA in the last 168 hours. Coagulation Profile: No results for input(s): INR, PROTIME in the last 168 hours. Cardiac Enzymes: No results for input(s):  CKTOTAL, CKMB, CKMBINDEX, TROPONINI in the last 168 hours. BNP (last 3 results) No results for input(s): PROBNP in the last 8760 hours. HbA1C: No results for input(s): HGBA1C in the last 72 hours. CBG: No results for input(s): GLUCAP in the last 168 hours. Lipid Profile: No results for input(s): CHOL, HDL, LDLCALC, TRIG, CHOLHDL, LDLDIRECT in the last 72 hours. Thyroid  Function Tests: No results for input(s): TSH, T4TOTAL, FREET4, T3FREE, THYROIDAB in the last 72 hours. Anemia Panel: No results for input(s): VITAMINB12, FOLATE, FERRITIN, TIBC, IRON, RETICCTPCT in the last 72 hours. Urine analysis:    Component Value Date/Time   BILIRUBINUR negative 12/03/2017 0912   PROTEINUR negative 12/03/2017 0912   UROBILINOGEN 0.2 12/03/2017 0912   NITRITE negative 12/03/2017 0912   LEUKOCYTESUR Negative 12/03/2017 0912    Radiological Exams on Admission: DG Chest Port 1 View Result Date: 09/25/2024 CLINICAL DATA:  Chest pain EXAM: PORTABLE CHEST 1 VIEW COMPARISON:  Chest x-ray 07/16/2023 FINDINGS: The heart size and mediastinal contours are within normal limits. Both lungs are clear. The visualized skeletal structures are unremarkable. IMPRESSION: No active disease. Electronically Signed   By: Greig Pique M.D.   On: 09/25/2024 18:40      Assessment/Plan   Principal Problem:   Ventricular tachycardia (HCC) Active Problems:   Acquired hypothyroidism   DM2 (diabetes mellitus, type 2) (HCC)   Leukocytosis   Hypomagnesemia     #) ventricular tachycardia: Presented to [facility] with ventricular tachycardia, awake, alert at time, normotensive, with ensuing electrocardioversion to sinus rhythm, with ensuing resolution of her presenting chest pressure.  She has history of similar in September 2020, felt to be due to large pericardial effusion in the setting of hypothyroidism at that time.  EDP discussed patient's case with on-call  cardiology fellow, Dr. Donnel, who recommended hospitalist admission to [facility] and conveyed that cardiology will formally consult. Cardiology recommended amiodarone infusion if the patient has subsequent run of ventricular tachycardia, has increased home metoprolol  succinate to bid, and requests that pt be kept NPO. I have notified Dr. Donnel of the patient's arrival at [facility].   Plan: Formal cardiology consult, as above.  NPO.  Echocardiogram the morning.  Follow-up result of TSH.  Further evaluation management of presenting hypomagnesemia.  Repeat mag level in the morning, CMP in the morning.  Discontinue heparin  drip per recommendation of cardiology.  Check BNP, urinary drug screen.  Monitor on telemetry.                 #) Hypomagnesemia: presenting serum mag level noted to be 1.6, notable in the context of presenting episode of ventricular tachycardia.    Plan: magnesium sulfate 2 g IV over 2 hours. Monitor on tele. Repeat serum mag level in the AM.                       # ) leukocytosis: Mildly elevated white blood cell count of 14,900, felt to be reactive/inflammatory in the context of presenting episode of V. Tach with potential of pericardial effusion given the above hx, also noting mild thrombocytosis consistent with reactive process.  Afebrile.  No evidence of underlying infectious process at this time, including chest x-ray which showed no evidence of infiltrate to suggest pna.  In the absence of overt underlying infectious process, criteria for sepsis are not currently met.  She appears medically stable.  Consequently we will refrain from initiation of IV antibiotics at this time.  Plan: Check urinalysis.  Monitor strict I's and O's and daily weights.  Repeat CBC in the morning.  Follow-up for echocardiogram ordered for the morning.                       #) Type 2 Diabetes Mellitus: documented history of such. Home insulin  regimen: None.  Home oral hypoglycemic agents: None.  On Mounjaro .  Presenting blood sugar: 288, without any evidence of anion gap metabolic acidosis. Most recent A1c noted to be 7.8% when checked on 07/27/2024.  Plan: In the setting of current n.p.o. status, will pursue Accu-Cheks every 6 hours with low-dose sliding scale insulin .  Hold home Mounjaro  during this hospitalization.                       #) acquired hypothyroidism: documented h/o such, on Synthroid  as outpatient.  TSH currently pending  Plan: Holding next dose of Synthroid  in the setting of current n.p.o. status.  Will follow for result of TSH, as above.  Echocardiogram ordered for the morning to evaluate for any underlying pericardial effusion, as further detailed above.      DVT prophylaxis: SCD's   Code Status: Full code Family Communication: none Disposition Plan: Per Rounding Team Consults called: EDP discussed patient's case with on-call cardiology fellow, Dr. Donnel, who recommended hospitalist admission to [facility] and conveyed that cardiology will formally consult. Cardiology recommended amiodarone infusion if the patient has subsequent run of ventricular tachycardia, has increased home metoprolol  succinate to bid, and requests that pt be kept NPO. I have notified Dr. Donnel of the patient's arrival at [facility]  Admission status: inpatient     I SPENT GREATER THAN 75  MINUTES IN CLINICAL CARE TIME/MEDICAL DECISION-MAKING IN COMPLETING THIS ADMISSION.      Eva NOVAK Jowana Thumma DO [facility]  From 7PM - 7AM   09/26/2024, 12:42 AM

## 2024-09-27 ENCOUNTER — Telehealth (HOSPITAL_COMMUNITY): Payer: Self-pay

## 2024-09-27 ENCOUNTER — Other Ambulatory Visit (HOSPITAL_COMMUNITY): Payer: Self-pay

## 2024-09-27 DIAGNOSIS — I472 Ventricular tachycardia, unspecified: Secondary | ICD-10-CM | POA: Diagnosis not present

## 2024-09-27 LAB — CBC
HCT: 35.2 % — ABNORMAL LOW (ref 36.0–46.0)
Hemoglobin: 11.9 g/dL — ABNORMAL LOW (ref 12.0–15.0)
MCH: 31.3 pg (ref 26.0–34.0)
MCHC: 33.8 g/dL (ref 30.0–36.0)
MCV: 92.6 fL (ref 80.0–100.0)
Platelets: 348 K/uL (ref 150–400)
RBC: 3.8 MIL/uL — ABNORMAL LOW (ref 3.87–5.11)
RDW: 12.6 % (ref 11.5–15.5)
WBC: 11.9 K/uL — ABNORMAL HIGH (ref 4.0–10.5)
nRBC: 0 % (ref 0.0–0.2)

## 2024-09-27 LAB — BASIC METABOLIC PANEL WITH GFR
Anion gap: 10 (ref 5–15)
BUN: 12 mg/dL (ref 6–20)
CO2: 25 mmol/L (ref 22–32)
Calcium: 8.4 mg/dL — ABNORMAL LOW (ref 8.9–10.3)
Chloride: 102 mmol/L (ref 98–111)
Creatinine, Ser: 0.73 mg/dL (ref 0.44–1.00)
GFR, Estimated: >60 mL/min (ref >60)
Glucose, Bld: 184 mg/dL — ABNORMAL HIGH (ref 70–99)
Potassium: 3.9 mmol/L (ref 3.5–5.1)
Sodium: 137 mmol/L (ref 135–145)

## 2024-09-27 LAB — TSH: TSH: 18.968 u[IU]/mL — ABNORMAL HIGH (ref 0.350–4.500)

## 2024-09-27 LAB — GLUCOSE, CAPILLARY
Glucose-Capillary: 118 mg/dL — ABNORMAL HIGH (ref 70–99)
Glucose-Capillary: 120 mg/dL — ABNORMAL HIGH (ref 70–99)
Glucose-Capillary: 180 mg/dL — ABNORMAL HIGH (ref 70–99)
Glucose-Capillary: 193 mg/dL — ABNORMAL HIGH (ref 70–99)

## 2024-09-27 LAB — PHOSPHORUS: Phosphorus: 2.9 mg/dL (ref 2.5–4.6)

## 2024-09-27 LAB — T4, FREE: Free T4: 0.66 ng/dL (ref 0.61–1.12)

## 2024-09-27 LAB — MAGNESIUM: Magnesium: 2 mg/dL (ref 1.7–2.4)

## 2024-09-27 MED ORDER — LEVOTHYROXINE SODIUM 100 MCG PO TABS
200.0000 ug | ORAL_TABLET | Freq: Every day | ORAL | Status: DC
  Administered 2024-09-28 – 2024-09-29 (×2): 200 ug via ORAL
  Filled 2024-09-27 (×2): qty 2

## 2024-09-27 MED ORDER — MAGNESIUM SULFATE 2 GM/50ML IV SOLN
2.0000 g | Freq: Once | INTRAVENOUS | Status: AC
  Administered 2024-09-27: 2 g via INTRAVENOUS
  Filled 2024-09-27: qty 50

## 2024-09-27 MED ORDER — SOTALOL HCL 80 MG PO TABS
80.0000 mg | ORAL_TABLET | Freq: Two times a day (BID) | ORAL | Status: DC
  Administered 2024-09-27 – 2024-09-29 (×5): 80 mg via ORAL
  Filled 2024-09-27 (×6): qty 1

## 2024-09-27 MED ORDER — METOPROLOL SUCCINATE ER 25 MG PO TB24: 25.0000 mg | ORAL_TABLET | Freq: Two times a day (BID) | ORAL | Status: DC

## 2024-09-27 MED ORDER — SODIUM CHLORIDE 0.9% FLUSH
3.0000 mL | Freq: Two times a day (BID) | INTRAVENOUS | Status: DC
  Administered 2024-09-27 – 2024-09-29 (×5): 3 mL via INTRAVENOUS

## 2024-09-27 MED ORDER — METOPROLOL SUCCINATE ER 25 MG PO TB24
25.0000 mg | ORAL_TABLET | Freq: Every day | ORAL | Status: DC
  Administered 2024-09-28 – 2024-09-29 (×2): 25 mg via ORAL
  Filled 2024-09-27 (×2): qty 1

## 2024-09-27 MED ORDER — POTASSIUM CHLORIDE CRYS ER 20 MEQ PO TBCR
40.0000 meq | EXTENDED_RELEASE_TABLET | Freq: Once | ORAL | Status: AC
  Administered 2024-09-27: 40 meq via ORAL
  Filled 2024-09-27: qty 2

## 2024-09-27 MED ORDER — SODIUM CHLORIDE 0.9% FLUSH: 3.0000 mL | INTRAVENOUS | Status: DC | PRN

## 2024-09-27 MED ORDER — SODIUM CHLORIDE 0.9 % IV SOLN: 250.0000 mL | INTRAVENOUS | Status: AC | PRN

## 2024-09-27 NOTE — Progress Notes (Signed)
 PROGRESS NOTE    Joanne Harris  FMW:[id number] DOB: [birth year] DOA: 09/25/2024 PCP: Mahlon Comer BRAVO, MD   Brief Narrative:  This 43 yrs old female with PMH significant for ventricular tachycardia, Chronic diastolic heart failure, Hypothyroidism, type 2 diabetes presented in the ED with an episode of ventricular tachycardia status post cardioversion to sinus rhythm.  She reports having chest pressure associated with palpitations.  She reports having similar presentation in September 2020 where she was found to be in V. tach in the setting of large pericardial effusion felt to be due to the hypothyroidism.  EDP discussed the case with cardiologist recommended amiodarone infusion if patient has subsequent runs of V. tach.  Cardiology thinks the presentation is less suggestive of ACS so heparin  was discontinued.  She reports feeling better,  denies any palpitations.  Patient started on metoprolol  XL 25 mg twice daily.  Amiodarone infusion was also kept on hold unless it recurs.  Echocardiogram completed shows LVEF 65 to 70%, small pericardial effusion.  Cardiology following   Assessment & Plan:   Principal Problem:   Ventricular tachycardia (HCC) Active Problems:   Acquired hypothyroidism   DM2 (diabetes mellitus, type 2) (HCC)   Leukocytosis   Hypomagnesemia  Monomorphic ventricular tachycardia :  Patient did have similar presentation in September 2024 this was in the setting of profound hypothyroidism.   She has been cardioverted.  Denies syncope. Continued on Toprol  50 mg twice daily. Cardiology started sotalol 80 mg twice daily today. Cardiology recommended c MRI but patient declines. Toprol  reduced to 25 mg daily.  Hypothyroidism: Patient reports medication compliance.  TSH is still elevated. Continue levothyroxine  200 mcg daily. Obtain full thyroid  profile.  Pericardial effusion: As described small on echo. Continue monitoring.  Diabetes mellitus ii : Start regular  insulin  sliding scale,  Hold p.o. diabetic medications  Obesity: Diet and exercise discussed in detail.  Hypomagnesemia: replaced.  Continue to monitor  Leukocytosis likely reactive.    DVT prophylaxis: SCDs Code Status: Full code Family Communication: No family at bed side Disposition Plan:   Status is: Inpatient Remains inpatient appropriate because: Patient admitted for monomorphic VT.  Patient not medically ready yet.    Consultants:  Cardiology  Procedures: Echocardiogram  Antimicrobials:  Anti-infectives (From admission, onward)    None      Subjective: Patient was seen and examined at bedside.  Overnight events noted. Patient reports feeling much improved,  denies any chest pain,  palpitations or shortness of breath.  Objective: Vitals:   09/27/24 0010 09/27/24 0450 09/27/24 0809 09/27/24 1138  BP: 134/87 (!) 149/90 (!) 134/95 (!) 138/106  Pulse: 84 80  80  Resp: 18 16    Temp: 99 F (37.2 C) 98.6 F (37 C) 98.3 F (36.8 C) 98.1 F (36.7 C)  TempSrc: Oral Oral Oral Oral  SpO2: 98% 99%  100%  Weight:  129.1 kg    Height:        Intake/Output Summary (Last 24 hours) at 09/27/2024 1601 Last data filed at 09/26/2024 1828 Gross per 24 hour  Intake 50 ml  Output --  Net 50 ml   Filed Weights   09/26/24 0010 09/26/24 0536 09/27/24 0450  Weight: 129.9 kg 129.9 kg 129.1 kg    Examination:  General exam: Appears calm and comfortable, morbidly obese, not in any acute distress. Respiratory system: Clear to auscultation. Respiratory effort normal. RR 14 Cardiovascular system: S1 & S2 heard, RRR. No JVD, murmurs, rubs, gallops or clicks. Gastrointestinal system: Abdomen  is non distended, soft and non tender.  Normal bowel sounds heard. Central nervous system: Alert and oriented X 3. No focal neurological deficits. Extremities: Symmetric 5 x 5 power. Skin: No rashes, lesions or ulcers Psychiatry: Judgement and insight appear normal. Mood & affect  appropriate.     Data Reviewed: I have personally reviewed following labs and imaging studies  CBC: Recent Labs  Lab 09/25/24 1725 09/27/24 0428  WBC 14.9* 11.9*  HGB 13.4 11.9*  HCT 39.5 35.2*  MCV 90.8 92.6  PLT 438* 348   Basic Metabolic Panel: Recent Labs  Lab 09/25/24 1725 09/26/24 0431 09/27/24 0428  NA 135 135 137  K 3.8 3.3* 3.9  CL 98 105 102  CO2 23 21* 25  GLUCOSE 288* 184* 184*  BUN 17 13 12   CREATININE 0.86 0.82 0.73  CALCIUM 9.7 8.4* 8.4*  MG 1.6* 1.9 2.0  PHOS  --   --  2.9   GFR: Estimated Creatinine Clearance: 123.2 mL/min (by C-G formula based on SCr of 0.73 mg/dL). Liver Function Tests: Recent Labs  Lab 09/26/24 0431  AST 14*  ALT 16  ALKPHOS 55  BILITOT 0.5  PROT 6.4*  ALBUMIN 3.1*   No results for input(s): LIPASE, AMYLASE in the last 168 hours. No results for input(s): AMMONIA in the last 168 hours. Coagulation Profile: No results for input(s): INR, PROTIME in the last 168 hours. Cardiac Enzymes: No results for input(s): CKTOTAL, CKMB, CKMBINDEX, TROPONINI in the last 168 hours. BNP (last 3 results) No results for input(s): PROBNP in the last 8760 hours. HbA1C: No results for input(s): HGBA1C in the last 72 hours. CBG: Recent Labs  Lab 09/26/24 0512 09/26/24 1135 09/26/24 1803 09/27/24 0547 09/27/24 1220  GLUCAP 181* 209* 226* 180* 193*   Lipid Profile: No results for input(s): CHOL, HDL, LDLCALC, TRIG, CHOLHDL, LDLDIRECT in the last 72 hours. Thyroid  Function Tests: Recent Labs    09/26/24 0439  TSH 13.250*   Anemia Panel: No results for input(s): VITAMINB12, FOLATE, FERRITIN, TIBC, IRON, RETICCTPCT in the last 72 hours. Sepsis Labs: No results for input(s): PROCALCITON, LATICACIDVEN in the last 168 hours.  No results found for this or any previous visit (from the past 240 hours).       Radiology Studies: ECHOCARDIOGRAM COMPLETE Result Date: 09/26/2024     ECHOCARDIOGRAM REPORT   Patient Name:   Joanne Harris Date of Exam: 09/26/2024 Medical Rec #:  [id number]           Height:       64.5 in Accession #:    [id number]          Weight:       286.4 lb Date of Birth:  01-29-1981          BSA:          2.291 m Patient Age:    43 years            BP:           115/61 mmHg Patient Gender: F                   HR:           86 bpm. Exam Location:  Inpatient Procedure: 2D Echo and Intracardiac Opacification Agent (Both Spectral and Color            Flow Doppler were utilized during procedure). Indications:    chest pain  History:  Patient has prior history of Echocardiogram examinations, most                 recent 12/01/2023. Arrythmias:V-tach; Risk Factors:Diabetes,                 Hypertension and Dyslipidemia.  Sonographer:    Tinnie Barefoot RDCS Referring Phys: [id number] EVA KATHEE PORE  Sonographer Comments: Patient is obese. Image acquisition challenging due to patient body habitus. IMPRESSIONS  1. Left ventricular ejection fraction, by estimation, is 60 to 65%. The left ventricle has normal function. The left ventricle has no regional wall motion abnormalities. There is mild concentric left ventricular hypertrophy. Left ventricular diastolic parameters were normal.  2. Right ventricular systolic function is normal. The right ventricular size is normal.  3. A small pericardial effusion is present. The pericardial effusion is circumferential. There is no evidence of cardiac tamponade.  4. The mitral valve is normal in structure. No evidence of mitral valve regurgitation. No evidence of mitral stenosis.  5. The aortic valve is normal in structure. Aortic valve regurgitation is not visualized. No aortic stenosis is present.  6. The inferior vena cava is normal in size with greater than 50% respiratory variability, suggesting right atrial pressure of 3 mmHg. FINDINGS  Left Ventricle: Left ventricular ejection fraction, by estimation, is 60 to 65%. The left  ventricle has normal function. The left ventricle has no regional wall motion abnormalities. Definity contrast agent was given IV to delineate the left ventricular  endocardial borders. The left ventricular internal cavity size was normal in size. There is mild concentric left ventricular hypertrophy. Left ventricular diastolic parameters were normal. Right Ventricle: The right ventricular size is normal. No increase in right ventricular wall thickness. Right ventricular systolic function is normal. Left Atrium: Left atrial size was normal in size. Right Atrium: Right atrial size was normal in size. Pericardium: A small pericardial effusion is present. The pericardial effusion is circumferential. There is no evidence of cardiac tamponade. Presence of epicardial fat layer. Mitral Valve: The mitral valve is normal in structure. No evidence of mitral valve regurgitation. No evidence of mitral valve stenosis. Tricuspid Valve: The tricuspid valve is normal in structure. Tricuspid valve regurgitation is not demonstrated. No evidence of tricuspid stenosis. Aortic Valve: The aortic valve is normal in structure. Aortic valve regurgitation is not visualized. No aortic stenosis is present. Pulmonic Valve: The pulmonic valve was normal in structure. Pulmonic valve regurgitation is not visualized. No evidence of pulmonic stenosis. Aorta: The aortic root is normal in size and structure. Venous: The inferior vena cava is normal in size with greater than 50% respiratory variability, suggesting right atrial pressure of 3 mmHg. IAS/Shunts: No atrial level shunt detected by color flow Doppler.  LEFT VENTRICLE PLAX 2D LVIDd:         4.30 cm   Diastology LVIDs:         2.60 cm   LV e' medial:    7.40 cm/s LV PW:         1.10 cm   LV E/e' medial:  10.7 LV IVS:        1.20 cm   LV e' lateral:   6.64 cm/s LVOT diam:     2.00 cm   LV E/e' lateral: 11.9 LV SV:         64 LV SV Index:   28 LVOT Area:     3.14 cm  RIGHT VENTRICLE  IVC RV Basal diam:  2.20 cm     IVC diam: 1.90 cm RV S prime:     15.20 cm/s TAPSE (M-mode): 1.8 cm LEFT ATRIUM             Index        RIGHT ATRIUM          Index LA diam:        4.50 cm 1.96 cm/m   RA Area:     8.86 cm LA Vol (A2C):   50.7 ml 22.13 ml/m  RA Volume:   15.90 ml 6.94 ml/m LA Vol (A4C):   41.7 ml 18.20 ml/m LA Biplane Vol: 47.3 ml 20.65 ml/m  AORTIC VALVE LVOT Vmax:   108.00 cm/s LVOT Vmean:  72.800 cm/s LVOT VTI:    0.204 m  AORTA Ao Root diam: 2.50 cm Ao Asc diam:  2.90 cm MITRAL VALVE MV Area (PHT): 3.99 cm    SHUNTS MV Decel Time: 190 msec    Systemic VTI:  0.20 m MV E velocity: 79.30 cm/s  Systemic Diam: 2.00 cm MV A velocity: 93.00 cm/s MV E/A ratio:  0.85 Kardie Tobb DO Electronically signed by Dub Huntsman DO Signature Date/Time: 09/26/2024/9:41:18 AM    Final    DG Chest Port 1 View Result Date: 09/25/2024 CLINICAL DATA:  Chest pain EXAM: PORTABLE CHEST 1 VIEW COMPARISON:  Chest x-ray 07/16/2023 FINDINGS: The heart size and mediastinal contours are within normal limits. Both lungs are clear. The visualized skeletal structures are unremarkable. IMPRESSION: No active disease. Electronically Signed   By: Greig Pique M.D.   On: 09/25/2024 18:40   Scheduled Meds:  buPROPion   150 mg Oral Daily   feeding supplement  237 mL Oral BID BM   insulin  aspart  0-9 Units Subcutaneous Q6H   [START ON 09/28/2024] levothyroxine   200 mcg Oral QAC breakfast   [START ON 09/28/2024] metoprolol  succinate  25 mg Oral Daily   sertraline   100 mg Oral Daily   sodium chloride  flush  3 mL Intravenous Q12H   sotalol  80 mg Oral Q12H   Continuous Infusions:  sodium chloride        LOS: 1 day    Time spent: 50 mins    Darcel Dawley, MD [facility]   If 7PM-7AM, please contact night-coverage

## 2024-09-27 NOTE — Progress Notes (Signed)
 Pharmacy Consult for Sotalol Electrolyte Replacement  Pharmacy consulted to assist in monitoring and replacing electrolytes in this 43 y.o. female admitted on 09/25/2024 undergoing sotalol initiation . First sotalol dose: 09/27/24 80 mg 12/3 AM  Labs:    Component Value Date/Time   K 3.9 09/27/2024 0428   MG 2.0 09/27/2024 0428     Plan: Potassium: K 3.8-3.9:  Hold Sotalol initiation and give KCl 40 mEq po x1 then begin Sotalol at least 2hr after KCl dose - do not need to recheck K   Magnesium: Mg 1.8-2: Give Mg 2 gm IV x1 to prevent Mg from dropping below 1.8 - do not need to recheck Mg. Appropriate to initiate Sotalol   Thank you for allowing pharmacy to participate in this patient's care   Maurilio Fila, PharmD Clinical Pharmacist 09/27/2024  8:51 AM

## 2024-09-27 NOTE — TOC CM/SW Note (Signed)
 Transition of Care [facility]) - Inpatient Brief Assessment   Patient Details  Name: Joanne Harris MRN: [id number] Date of Birth: 12-Feb-1981  [facility] of [facility]) CM/SW Contact:    Sudie Erminio Deems, RN Phone Number: 09/27/2024, 2:43 PM   Clinical Narrative: Patient presented for Sotalol Load. Inpatient Case Manager spoke with the patient regarding co pay cost. Benefits check submitted and the patient is agreeable to cost and would like to have the initial Rx filled via [facility]. PTA patient was from home alone. Patient states she has a PCP and has no issues with transportation. No further needs identified at this time.   Transition of Care Asessment: Insurance and Status: Insurance coverage has been reviewed Patient has primary care physician: Yes Home environment has been reviewed: reviewed Prior level of function:: independent Prior/Current Home Services: No current home services Social Drivers of Health Review: SDOH reviewed no interventions necessary Readmission risk has been reviewed: Yes Transition of care needs: no transition of care needs at this time

## 2024-09-27 NOTE — Progress Notes (Cosign Needed)
 Rounding Note   Patient Name: Joanne Harris Date of Encounter: 09/27/2024  [facility] Cardiologist: Darryle ONEIDA Decent, MD   Subjective  Feels well, would like to go home, agreeable to stay for sotalol  Scheduled Meds:  buPROPion   150 mg Oral Daily   feeding supplement  237 mL Oral BID BM   insulin  aspart  0-9 Units Subcutaneous Q6H   metoprolol  succinate  25 mg Oral BID   sertraline   100 mg Oral Daily   sodium chloride  flush  3 mL Intravenous Q12H   sotalol  80 mg Oral Q12H   Continuous Infusions:  sodium chloride      PRN Meds: sodium chloride , acetaminophen  **OR** acetaminophen , melatonin, sodium chloride  flush   Vital Signs  Vitals:   09/26/24 2033 09/27/24 0010 09/27/24 0450 09/27/24 0809  BP: 116/64 134/87 (!) 149/90 (!) 134/95  Pulse: 82 84 80   Resp: 18 18 16    Temp: 99 F (37.2 C) 99 F (37.2 C) 98.6 F (37 C) 98.3 F (36.8 C)  TempSrc: Oral Oral Oral Oral  SpO2: 98% 98% 99%   Weight:   129.1 kg   Height:        Intake/Output Summary (Last 24 hours) at 09/27/2024 0835 Last data filed at 09/26/2024 1828 Gross per 24 hour  Intake 50 ml  Output --  Net 50 ml      09/27/2024    4:50 AM 09/26/2024    5:36 AM 09/26/2024   12:10 AM  Last 3 Weights  Weight (lbs) 284 lb 9.6 oz 286 lb 6 oz 286 lb 6 oz  Weight (kg) 129.094 kg 129.9 kg 129.9 kg      Telemetry   SR, occ PVCs with increase frequency at times - Personally Reviewed  ECG   09/26/24: ST 106bpm, QTc 436  - Personally Reviewed  Physical Exam  Unchanged exam from yesterday GEN: No acute distress.   Neck: No JVD Cardiac: RRR, no murmurs, rubs, or gallops.  Respiratory: CTA b/l. GI: Soft, nontender, non-distended  MS: No edema; No deformity. Neuro:  Nonfocal  Psych: Normal affect   Labs High Sensitivity Troponin:   Recent Labs  Lab 09/26/24 0439  TROPONINIHS 20*     Chemistry Recent Labs  Lab 09/25/24 1725 09/26/24 0431 09/27/24 0428  NA 135 135 137  K 3.8  3.3* 3.9  CL 98 105 102  CO2 23 21* 25  GLUCOSE 288* 184* 184*  BUN 17 13 12   CREATININE 0.86 0.82 0.73  CALCIUM 9.7 8.4* 8.4*  MG 1.6* 1.9 2.0  PROT  --  6.4*  --   ALBUMIN  --  3.1*  --   AST  --  14*  --   ALT  --  16  --   ALKPHOS  --  55  --   BILITOT  --  0.5  --   GFRNONAA >60 >60 >60  ANIONGAP 14 9 10     Lipids No results for input(s): CHOL, TRIG, HDL, LABVLDL, LDLCALC, CHOLHDL in the last 168 hours.  Hematology Recent Labs  Lab 09/25/24 1725 09/27/24 0428  WBC 14.9* 11.9*  RBC 4.35 3.80*  HGB 13.4 11.9*  HCT 39.5 35.2*  MCV 90.8 92.6  MCH 30.8 31.3  MCHC 33.9 33.8  RDW 12.4 12.6  PLT 438* 348   Thyroid   Recent Labs  Lab 09/26/24 0439  TSH 13.250*    BNP Recent Labs  Lab 09/26/24 0431  BNP 90.1    DDimer No  results for input(s): DDIMER in the last 168 hours.   Radiology    Cardiac Studies  09/26/24: TTE 1. Left ventricular ejection fraction, by estimation, is 60 to 65%. The  left ventricle has normal function. The left ventricle has no regional  wall motion abnormalities. There is mild concentric left ventricular  hypertrophy. Left ventricular diastolic  parameters were normal.   2. Right ventricular systolic function is normal. The right ventricular  size is normal.   3. A small pericardial effusion is present. The pericardial effusion is  circumferential. There is no evidence of cardiac tamponade.   4. The mitral valve is normal in structure. No evidence of mitral valve  regurgitation. No evidence of mitral stenosis.   5. The aortic valve is normal in structure. Aortic valve regurgitation is  not visualized. No aortic stenosis is present.   6. The inferior vena cava is normal in size with greater than 50%  respiratory variability, suggesting right atrial pressure of 3 mmHg.      Monitor 1 HR 72 - 113, average 79 bpm. No atrial fibrillation detected. Rare supraventricular ectopy. Rare ventricular ectopy. No sustained  arrhythmias. Symptom trigger episodes correspond to normal sinus rhythm.   Monitor 2 HR 66 - 143, average 75 bpm. 13 nonsustained VT (longest 5 beats) No atrial fibrillation detected. Rare supraventricular ectopy. Frequent ventricular ectopy, 5%. No sustained arrhythmias. Symptom trigger episodes correspond to PVCs.   RHC/LHC 07/19/23:  Conclusions: No angiographically significant coronary artery disease. Mildly to moderately elevated left heart and pulmonary artery pressures. Severely elevated right heart filling pressures. Normal Fick cardiac output/index. Hemodynamics with equivocal findings of cardiac tamponade (absent y-descent noted on RA pressure tracing, elevated right heart pressures not completely equalized with left heart pressures, and concordant LV/RV pressures). Recommendations: Proceed with cardiac MRI. Favor deferring drainage of pericardial effusion at this time, given lack of symptoms and equivocal hemodynamic findings of tamponade physiology. Primary prevention of coronary artery disease.   Echo 07/17/23:  1. Limited echo windows- Definity contrast was not administered. Left  ventricular ejection fraction, by estimation, is 60 to 65%. The left  ventricle has normal function. The left ventricle has no regional wall  motion abnormalities. There is moderate  left ventricular hypertrophy. Left ventricular diastolic parameters are  consistent with Grade I diastolic dysfunction (impaired relaxation).   2. Poorly visualized, appears underfilled. RA and RV collapse noted. RVEF  is reduced. Right ventricular systolic function is moderately reduced. The  right ventricular size is small. There is normal pulmonary artery systolic  pressure. The estimated right   ventricular systolic pressure is 11.7 mmHg.   3. Cannot rule-out tamponade physiology. Large pericardial effusion. The  pericardial effusion is circumferential.   4. The mitral valve is grossly normal. Trivial  mitral valve  regurgitation.   5. The aortic valve was not well visualized. Aortic valve regurgitation  is not visualized. No aortic stenosis is present.   6. IVC is poorly visualized, but does not appear dilated.    Cardiac MRI 07/20/23:  IMPRESSION: 1. Initial study on 07/20/23 was very poor quality. She was brought back for repeat study on 07/21/23. Issue on initial study was that EKG gating was done but scanner was having difficulty detecting QRS complex due to low voltage, leading to inaccurate gating. For second study, switched to pulse triggering using pulse oximeter with significant improvement in image quality   2. Large pericardial effusion measuring up to 4cm adjacent to LV lateral wall. No evidence of ventricular interdependence  on real time imaging   3. Myocardial T2 values are diffusely elevated, suggesting significant myocardial edema. In addition, there is marked elevation of ECV (40%) and patchy midwall LGE   4. Small biventricular size with normal systolic function. Asymmetric LV hypertrophy measuring 14mm in basal septum (10mm in posterior wall), not meeting criteria for HCM (<51mm)   5. Suspect findings (elevated T1/T2/ECV, LGE, pericardial effusion) are due to severe hypothyroidism. ECV however is higher than typically expected, and is on the border of the expected range for amyloid. Early amyloid is on the differential given the LVH, ECV, and LGE findings, though suspect more likely hypothyroidism as cause. Recommend checking SPEP/UPEP/light chains and PYP scan to evaluate for cardiac amyloid, but if unremarkable suspect hypothyroidism as etiology of findings and would suggest repeating cardiac MRI once thyroid  disease has been treated    Patient Profile   43 y.o. 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 Kennyth, OK to start Reviewed meds, d/w RPH K+ 3.9 Mag 2.0 Creat 0.73  Would like like to repeat c.MRI this admission >> though she declines Reduce Toprol  back to 25mg  daily    hypothyroid she reports excellent medication compliance, following with her PMD for thyroid  management.  Of late her numbers have not been perfect they continue to work on it   TSH has come back 14.9 today Deferred to IM management   3. Pericardial effusion Described as small on today's echo  For questions or updates, please contact [facility] Please consult [email or web address] for contact info under    Signed, Charlies Macario Arthur, PA-C  09/27/2024, 8:35 AM    I have seen, examined the patient, and reviewed the above assessment and plan.    Interval: No acute overnight events. Patient reports feeling relatively well. No new or acute complaints.   General: Well developed, in no acute distress.  Neck: No JVD.  Cardiac: Normal rate, regular rhythm.  Resp: Normal work of breathing.  Ext: No edema.  Neuro: No gross focal deficits.  Psych: Normal affect.   Cr: 0.73 K: 4.0  EKG: Qtc stable  Assessment:  Sustained MMVT  Plan:  - Start sotalol 80 mcg with ECG 2 hours after each dose.  - Continue routine monitoring of kidney function and electrolytes, replete as necessary.  - Decrease metoprolol  XL to 25mg  daily.  Fonda Kennyth, MD

## 2024-09-27 NOTE — Progress Notes (Signed)
 Post dose EKG reviewed SR 79bpm, QTc stable Continue sotalol  Charlies Arthur, PA-C

## 2024-09-27 NOTE — Telephone Encounter (Signed)
 Pharmacy Patient Advocate Encounter  Insurance verification completed.    The patient is insured through [employer]. Patient has [employer], may use a copay card, and/or apply for patient assistance if available.    Ran test claim for Sotalol 80mg  tablet and the current 30 day co-pay is $5.87.   This test claim was processed through [facility]- copay amounts may vary at other pharmacies due to pharmacy/plan contracts, or as the patient moves through the different stages of their insurance plan.

## 2024-09-28 ENCOUNTER — Other Ambulatory Visit: Payer: Self-pay

## 2024-09-28 DIAGNOSIS — I472 Ventricular tachycardia, unspecified: Secondary | ICD-10-CM | POA: Diagnosis not present

## 2024-09-28 LAB — BASIC METABOLIC PANEL WITH GFR
Anion gap: 8 (ref 5–15)
BUN: 11 mg/dL (ref 6–20)
CO2: 22 mmol/L (ref 22–32)
Calcium: 8.6 mg/dL — ABNORMAL LOW (ref 8.9–10.3)
Chloride: 105 mmol/L (ref 98–111)
Creatinine, Ser: 0.73 mg/dL (ref 0.44–1.00)
GFR, Estimated: >60 mL/min (ref >60)
Glucose, Bld: 141 mg/dL — ABNORMAL HIGH (ref 70–99)
Potassium: 4 mmol/L (ref 3.5–5.1)
Sodium: 135 mmol/L (ref 135–145)

## 2024-09-28 LAB — GLUCOSE, CAPILLARY
Glucose-Capillary: 167 mg/dL — ABNORMAL HIGH (ref 70–99)
Glucose-Capillary: 165 mg/dL — ABNORMAL HIGH (ref 70–99)
Glucose-Capillary: 153 mg/dL — ABNORMAL HIGH (ref 70–99)

## 2024-09-28 LAB — CBC
HCT: 36.9 % (ref 36.0–46.0)
Hemoglobin: 12.1 g/dL (ref 12.0–15.0)
MCH: 30.9 pg (ref 26.0–34.0)
MCHC: 32.8 g/dL (ref 30.0–36.0)
MCV: 94.4 fL (ref 80.0–100.0)
Platelets: 391 K/uL (ref 150–400)
RBC: 3.91 MIL/uL (ref 3.87–5.11)
RDW: 12.6 % (ref 11.5–15.5)
WBC: 12.5 K/uL — ABNORMAL HIGH (ref 4.0–10.5)
nRBC: 0 % (ref 0.0–0.2)

## 2024-09-28 LAB — MAGNESIUM: Magnesium: 2.1 mg/dL (ref 1.7–2.4)

## 2024-09-28 LAB — T3, FREE: T3, Free: 1.3 pg/mL — ABNORMAL LOW (ref 2.0–4.4)

## 2024-09-28 NOTE — Progress Notes (Signed)
 Post dose EKG reviewed SR, QTc remains stable Continue Sotalol Anticipate discharge tomorrow afternoon  Charlies Arthur, PA-C

## 2024-09-28 NOTE — Progress Notes (Addendum)
 Rounding Note   Patient Name: Joanne Harris Date of Encounter: 09/28/2024  [facility] Cardiologist: Darryle ONEIDA Decent, MD   Subjective  No new concerns  Scheduled Meds:  buPROPion   150 mg Oral Daily   feeding supplement  237 mL Oral BID BM   insulin  aspart  0-9 Units Subcutaneous Q6H   levothyroxine   200 mcg Oral QAC breakfast   metoprolol  succinate  25 mg Oral Daily   sertraline   100 mg Oral Daily   sodium chloride  flush  3 mL Intravenous Q12H   sotalol   80 mg Oral Q12H   Continuous Infusions:  sodium chloride      PRN Meds: sodium chloride , acetaminophen  **OR** acetaminophen , melatonin, sodium chloride  flush   Vital Signs  Vitals:   09/27/24 1620 09/27/24 2017 09/27/24 2352 09/28/24 0416  BP: 121/89 (!) 144/96 116/77 110/72  Pulse: 76 79 77 74  Resp:  18 18 18   Temp: 99.2 F (37.3 C) 98.9 F (37.2 C) 98.6 F (37 C) 97.9 F (36.6 C)  TempSrc: Oral Oral Oral Oral  SpO2: 100% 96% 97% 98%  Weight:    127.9 kg  Height:        Intake/Output Summary (Last 24 hours) at 09/28/2024 0809 Last data filed at 09/27/2024 1755 Gross per 24 hour  Intake 50 ml  Output --  Net 50 ml      09/28/2024    4:16 AM 09/27/2024    4:50 AM 09/26/2024    5:36 AM  Last 3 Weights  Weight (lbs) 281 lb 14.4 oz 284 lb 9.6 oz 286 lb 6 oz  Weight (kg) 127.869 kg 129.094 kg 129.9 kg      Telemetry   SR, PVCs burden is improved - Personally Reviewed  ECG   Done this morning is SR 76bpm, QTc , PVCs  - Personally Reviewed  Physical Exam  Unchanged exam from yesterday GEN: No acute distress.   Neck: No JVD Cardiac: RRR, no murmurs, rubs, or gallops.  Respiratory: CTA b/l. GI: Soft, nontender, non-distended  MS: No edema; No deformity. Neuro:  Nonfocal  Psych: Normal affect   Labs High Sensitivity Troponin:   Recent Labs  Lab 09/26/24 0439  TROPONINIHS 20*     Chemistry Recent Labs  Lab 09/26/24 0431 09/27/24 0428 09/28/24 0446  NA 135 137 135   K 3.3* 3.9 4.0  CL 105 102 105  CO2 21* 25 22  GLUCOSE 184* 184* 141*  BUN 13 12 11   CREATININE 0.82 0.73 0.73  CALCIUM 8.4* 8.4* 8.6*  MG 1.9 2.0 2.1  PROT 6.4*  --   --   ALBUMIN 3.1*  --   --   AST 14*  --   --   ALT 16  --   --   ALKPHOS 55  --   --   BILITOT 0.5  --   --   GFRNONAA >60 >60 >60  ANIONGAP 9 10 8     Lipids No results for input(s): CHOL, TRIG, HDL, LABVLDL, LDLCALC, CHOLHDL in the last 168 hours.  Hematology Recent Labs  Lab 09/25/24 1725 09/27/24 0428 09/28/24 0446  WBC 14.9* 11.9* 12.5*  RBC 4.35 3.80* 3.91  HGB 13.4 11.9* 12.1  HCT 39.5 35.2* 36.9  MCV 90.8 92.6 94.4  MCH 30.8 31.3 30.9  MCHC 33.9 33.8 32.8  RDW 12.4 12.6 12.6  PLT 438* 348 391   Thyroid   Recent Labs  Lab 09/27/24 1852  TSH 18.968*  FREET4 0.66  BNP Recent Labs  Lab 09/26/24 0431  BNP 90.1    DDimer No results for input(s): DDIMER in the last 168 hours.   Radiology    Cardiac Studies  09/26/24: TTE 1. Left ventricular ejection fraction, by estimation, is 60 to 65%. The  left ventricle has normal function. The left ventricle has no regional  wall motion abnormalities. There is mild concentric left ventricular  hypertrophy. Left ventricular diastolic  parameters were normal.   2. Right ventricular systolic function is normal. The right ventricular  size is normal.   3. A small pericardial effusion is present. The pericardial effusion is  circumferential. There is no evidence of cardiac tamponade.   4. The mitral valve is normal in structure. No evidence of mitral valve  regurgitation. No evidence of mitral stenosis.   5. The aortic valve is normal in structure. Aortic valve regurgitation is  not visualized. No aortic stenosis is present.   6. The inferior vena cava is normal in size with greater than 50%  respiratory variability, suggesting right atrial pressure of 3 mmHg.      Monitor 1 HR 72 - 113, average 79 bpm. No atrial fibrillation  detected. Rare supraventricular ectopy. Rare ventricular ectopy. No sustained arrhythmias. Symptom trigger episodes correspond to normal sinus rhythm.   Monitor 2 HR 66 - 143, average 75 bpm. 13 nonsustained VT (longest 5 beats) No atrial fibrillation detected. Rare supraventricular ectopy. Frequent ventricular ectopy, 5%. No sustained arrhythmias. Symptom trigger episodes correspond to PVCs.   RHC/LHC 07/19/23:  Conclusions: No angiographically significant coronary artery disease. Mildly to moderately elevated left heart and pulmonary artery pressures. Severely elevated right heart filling pressures. Normal Fick cardiac output/index. Hemodynamics with equivocal findings of cardiac tamponade (absent y-descent noted on RA pressure tracing, elevated right heart pressures not completely equalized with left heart pressures, and concordant LV/RV pressures). Recommendations: Proceed with cardiac MRI. Favor deferring drainage of pericardial effusion at this time, given lack of symptoms and equivocal hemodynamic findings of tamponade physiology. Primary prevention of coronary artery disease.   Echo 07/17/23:  1. Limited echo windows- Definity contrast was not administered. Left  ventricular ejection fraction, by estimation, is 60 to 65%. The left  ventricle has normal function. The left ventricle has no regional wall  motion abnormalities. There is moderate  left ventricular hypertrophy. Left ventricular diastolic parameters are  consistent with Grade I diastolic dysfunction (impaired relaxation).   2. Poorly visualized, appears underfilled. RA and RV collapse noted. RVEF  is reduced. Right ventricular systolic function is moderately reduced. The  right ventricular size is small. There is normal pulmonary artery systolic  pressure. The estimated right   ventricular systolic pressure is 11.7 mmHg.   3. Cannot rule-out tamponade physiology. Large pericardial effusion. The  pericardial  effusion is circumferential.   4. The mitral valve is grossly normal. Trivial mitral valve  regurgitation.   5. The aortic valve was not well visualized. Aortic valve regurgitation  is not visualized. No aortic stenosis is present.   6. IVC is poorly visualized, but does not appear dilated.    Cardiac MRI 07/20/23:  IMPRESSION: 1. Initial study on 07/20/23 was very poor quality. She was brought back for repeat study on 07/21/23. Issue on initial study was that EKG gating was done but scanner was having difficulty detecting QRS complex due to low voltage, leading to inaccurate gating. For second study, switched to pulse triggering using pulse oximeter with significant improvement in image quality   2. Large pericardial  effusion measuring up to 4cm adjacent to LV lateral wall. No evidence of ventricular interdependence on real time imaging   3. Myocardial T2 values are diffusely elevated, suggesting significant myocardial edema. In addition, there is marked elevation of ECV (40%) and patchy midwall LGE   4. Small biventricular size with normal systolic function. Asymmetric LV hypertrophy measuring 14mm in basal septum (10mm in posterior wall), not meeting criteria for HCM (<26mm)   5. Suspect findings (elevated T1/T2/ECV, LGE, pericardial effusion) are due to severe hypothyroidism. ECV however is higher than typically expected, and is on the border of the expected range for amyloid. Early amyloid is on the differential given the LVH, ECV, and LGE findings, though suspect more likely hypothyroidism as cause. Recommend checking SPEP/UPEP/light chains and PYP scan to evaluate for cardiac amyloid, but if unremarkable suspect hypothyroidism as etiology of findings and would suggest repeating cardiac MRI once thyroid  disease has been treated    Patient Profile   43 y.o. 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 Kennyth, OK to start Reviewed meds, d/w RPH K+ 4.0 Mag 2.1 Creat 0.73 (stable QTC in reviwq of telemetry ~ 2 hours post dose QT was OK (no EKG done) EKG this AM with stable QTc  Plan to complete 5 doses in patient, anticipate discharge tomorrow afternoon  Would like like to repeat c.MRI this admission >> though she declines Reduce Toprol  back to 25mg  daily    hypothyroid she reports excellent medication compliance, following with her PMD for thyroid  management.  Of late her numbers have not been perfect they continue to work on it   TSH has come back 14.9 09/27/24 here Deferred to IM management   3. Pericardial effusion Described as small on this admission's echo  For questions or updates, please contact [facility] Please consult [email or web address] for contact info under    Signed, Charlies Macario Arthur, PA-C  09/28/2024, 8:09 AM    I have seen, examined the patient, and reviewed the above assessment and plan.    Interval: No acute overnight events. Patient reports feeling relatively well. No new or acute complaints.   General: Well developed, in no acute distress.  Neck: No JVD.  Cardiac: Normal rate, regular rhythm.  Resp: Normal work of breathing.  Ext: No edema.  Neuro: No gross focal deficits.  Psych: Normal affect.   Cr: 0.73 K: 4.0  EKG: QTC  Assessment:  #Monomorphic VT, idiopathic #Hypothyroidism  Plan:  - Continue sotalol 80mg  BID with ECG 2 hours after each dose. Not an ablation candidate due to body habitus. - Continue routine monitoring of kidney function and electrolytes, replete as necessary.  - Repeat cardiac MRI as outpatient.  - Appreciate medicine  assistance in management of thyroid  disease.   Fonda Kennyth, MD 09/28/2024 9:14 PM

## 2024-09-28 NOTE — Progress Notes (Signed)
 PROGRESS NOTE    Joanne Harris  FMW:[id number] DOB: 01-29-1981 DOA: 09/25/2024 PCP: Mahlon Comer BRAVO, MD   Brief Narrative:  This 43 yrs old female with PMH significant for ventricular tachycardia, Chronic diastolic heart failure, Hypothyroidism, type 2 diabetes presented in the ED with an episode of ventricular tachycardia status post cardioversion to sinus rhythm.  She reports having chest pressure associated with palpitations.  She reports having similar presentation in September 2020 where she was found to be in V. tach in the setting of large pericardial effusion felt to be due to the hypothyroidism.  EDP discussed the case with cardiologist recommended amiodarone infusion if patient has subsequent runs of V. tach.  Cardiology thinks the presentation is less suggestive of ACS so heparin  was discontinued.  She reports feeling better,  denies any palpitations.  Patient started on metoprolol  XL 25 mg twice daily.  Amiodarone infusion was also kept on hold unless it recurs.  Echocardiogram completed shows LVEF 65 to 70%, small pericardial effusion.  Cardiology following   Assessment & Plan:   Principal Problem:   Ventricular tachycardia (HCC) Active Problems:   Acquired hypothyroidism   DM2 (diabetes mellitus, type 2) (HCC)   Leukocytosis   Hypomagnesemia  Monomorphic ventricular tachycardia :  Patient did have similar presentation in September 2024 this was in the setting of profound hypothyroidism.   She has been cardioverted.  Denies syncope. Continued on Toprol  50 mg twice daily. Cardiology started sotalol 80 mg twice daily yesterday. Cardiology recommended c MRI but patient declines. Toprol  reduced to 25 mg daily. Per cardiology patient has to complete 5 doses of sotalol inpatient before discharge.  Hypothyroidism: Patient reports medication compliance.  TSH is still elevated. Continue levothyroxine  200 mcg daily. Follow-up with thyroid  profile.  Pericardial  effusion: As described small on echo. Continue monitoring.  Diabetes mellitus ii : Start regular insulin  sliding scale,  Hold p.o. diabetic medications  Obesity: Diet and exercise discussed in detail.  Hypomagnesemia: replaced.  Continue to monitor  Leukocytosis likely reactive.  DVT prophylaxis: SCDs Code Status: Full code Family Communication: No family at bed side Disposition Plan:   Status is: Inpatient Remains inpatient appropriate because: Patient admitted for monomorphic VT.  Patient not medically ready yet. Per cardiology patient has to complete 5 doses of sotalol inpatient before discharge.    Consultants:  Cardiology  Procedures: Echocardiogram  Antimicrobials:  Anti-infectives (From admission, onward)    None      Subjective: Patient was seen and examined at bedside.  Overnight events noted. Patient reports feeling much improved,  denies any chest pain,  palpitations or shortness of breath.  Objective: Vitals:   09/28/24 0416 09/28/24 0833 09/28/24 1046 09/28/24 1233  BP: 110/72 (!) 142/93  99/73  Pulse: 74 77 81 75  Resp: 18 16  16   Temp: 97.9 F (36.6 C) 98.7 F (37.1 C)  98.6 F (37 C)  TempSrc: Oral Oral  Oral  SpO2: 98% 100%  99%  Weight: 127.9 kg     Height:        Intake/Output Summary (Last 24 hours) at 09/28/2024 1442 Last data filed at 09/28/2024 0920 Gross per 24 hour  Intake 290 ml  Output --  Net 290 ml   Filed Weights   09/26/24 0536 09/27/24 0450 09/28/24 0416  Weight: 129.9 kg 129.1 kg 127.9 kg    Examination:  General exam: Appears calm and comfortable, morbidly obese, not in any acute distress. Respiratory system: Clear to auscultation. Respiratory effort normal.  RR 15 Cardiovascular system: S1 & S2 heard, RRR. No JVD, murmurs, rubs, gallops or clicks. Gastrointestinal system: Abdomen is non distended, soft and non tender.  Normal bowel sounds heard. Central nervous system: Alert and oriented X 3. No focal  neurological deficits. Extremities: No edema, no cyanosis, no clubbing. Skin: No rashes, lesions or ulcers Psychiatry: Judgement and insight appear normal. Mood & affect appropriate.     Data Reviewed: I have personally reviewed following labs and imaging studies  CBC: Recent Labs  Lab 09/25/24 1725 09/27/24 0428 09/28/24 0446  WBC 14.9* 11.9* 12.5*  HGB 13.4 11.9* 12.1  HCT 39.5 35.2* 36.9  MCV 90.8 92.6 94.4  PLT 438* 348 391   Basic Metabolic Panel: Recent Labs  Lab 09/25/24 1725 09/26/24 0431 09/27/24 0428 09/28/24 0446  NA 135 135 137 135  K 3.8 3.3* 3.9 4.0  CL 98 105 102 105  CO2 23 21* 25 22  GLUCOSE 288* 184* 184* 141*  BUN 17 13 12 11   CREATININE 0.86 0.82 0.73 0.73  CALCIUM 9.7 8.4* 8.4* 8.6*  MG 1.6* 1.9 2.0 2.1  PHOS  --   --  2.9  --    GFR: Estimated Creatinine Clearance: 122.5 mL/min (by C-G formula based on SCr of 0.73 mg/dL). Liver Function Tests: Recent Labs  Lab 09/26/24 0431  AST 14*  ALT 16  ALKPHOS 55  BILITOT 0.5  PROT 6.4*  ALBUMIN 3.1*   No results for input(s): LIPASE, AMYLASE in the last 168 hours. No results for input(s): AMMONIA in the last 168 hours. Coagulation Profile: No results for input(s): INR, PROTIME in the last 168 hours. Cardiac Enzymes: No results for input(s): CKTOTAL, CKMB, CKMBINDEX, TROPONINI in the last 168 hours. BNP (last 3 results) No results for input(s): PROBNP in the last 8760 hours. HbA1C: No results for input(s): HGBA1C in the last 72 hours. CBG: Recent Labs  Lab 09/27/24 1220 09/27/24 1706 09/27/24 2351 09/28/24 0549 09/28/24 1232  GLUCAP 193* 120* 118* 167* 165*   Lipid Profile: No results for input(s): CHOL, HDL, LDLCALC, TRIG, CHOLHDL, LDLDIRECT in the last 72 hours. Thyroid  Function Tests: Recent Labs    09/27/24 1852  TSH 18.968*  FREET4 0.66  T3FREE 1.3*   Anemia Panel: No results for input(s): VITAMINB12, FOLATE, FERRITIN, TIBC,  IRON, RETICCTPCT in the last 72 hours. Sepsis Labs: No results for input(s): PROCALCITON, LATICACIDVEN in the last 168 hours.  No results found for this or any previous visit (from the past 240 hours).   Radiology Studies: No results found.  Scheduled Meds:  buPROPion   150 mg Oral Daily   feeding supplement  237 mL Oral BID BM   insulin  aspart  0-9 Units Subcutaneous Q6H   levothyroxine   200 mcg Oral QAC breakfast   metoprolol  succinate  25 mg Oral Daily   sertraline   100 mg Oral Daily   sodium chloride  flush  3 mL Intravenous Q12H   sotalol   80 mg Oral Q12H   Continuous Infusions:     LOS: 2 days    Time spent: 35 mins    Darcel Dawley, MD [facility]   If 7PM-7AM, please contact night-coverage

## 2024-09-28 NOTE — Plan of Care (Signed)
  Problem: Education: Goal: Knowledge of General Education information will improve Description: Including pain rating scale, medication(s)/side effects and non-pharmacologic comfort measures Outcome: Progressing   Problem: Health Behavior/Discharge Planning: Goal: Ability to manage health-related needs will improve Outcome: Progressing   Problem: Clinical Measurements: Goal: Ability to maintain clinical measurements within normal limits will improve Outcome: Progressing Goal: Will remain free from infection Outcome: Progressing Goal: Diagnostic test results will improve Outcome: Progressing Goal: Respiratory complications will improve Outcome: Progressing Goal: Cardiovascular complication will be avoided Outcome: Progressing   Problem: Activity: Goal: Risk for activity intolerance will decrease Outcome: Progressing   Problem: Nutrition: Goal: Adequate nutrition will be maintained Outcome: Progressing   Problem: Coping: Goal: Level of anxiety will decrease Outcome: Progressing   Problem: Elimination: Goal: Will not experience complications related to bowel motility Outcome: Progressing Goal: Will not experience complications related to urinary retention Outcome: Progressing   Problem: Pain Managment: Goal: General experience of comfort will improve and/or be controlled Outcome: Progressing   Problem: Safety: Goal: Ability to remain free from injury will improve Outcome: Progressing   Problem: Skin Integrity: Goal: Risk for impaired skin integrity will decrease Outcome: Progressing   Problem: Education: Goal: Ability to describe self-care measures that may prevent or decrease complications (Diabetes Survival Skills Education) will improve Outcome: Progressing Goal: Individualized Educational Video(s) Outcome: Progressing   Problem: Coping: Goal: Ability to adjust to condition or change in health will improve Outcome: Progressing   Problem: Fluid  Volume: Goal: Ability to maintain a balanced intake and output will improve Outcome: Progressing   Problem: Health Behavior/Discharge Planning: Goal: Ability to identify and utilize available resources and services will improve Outcome: Progressing Goal: Ability to manage health-related needs will improve Outcome: Progressing   Problem: Metabolic: Goal: Ability to maintain appropriate glucose levels will improve Outcome: Progressing   Problem: Nutritional: Goal: Maintenance of adequate nutrition will improve Outcome: Progressing Goal: Progress toward achieving an optimal weight will improve Outcome: Progressing   Problem: Skin Integrity: Goal: Risk for impaired skin integrity will decrease Outcome: Progressing   Problem: Tissue Perfusion: Goal: Adequacy of tissue perfusion will improve Outcome: Progressing   Problem: Education: Goal: Knowledge of disease or condition will improve Outcome: Progressing Goal: Understanding of medication regimen will improve Outcome: Progressing Goal: Individualized Educational Video(s) Outcome: Progressing   Problem: Activity: Goal: Ability to tolerate increased activity will improve Outcome: Progressing   Problem: Cardiac: Goal: Ability to achieve and maintain adequate cardiopulmonary perfusion will improve Outcome: Progressing   Problem: Health Behavior/Discharge Planning: Goal: Ability to safely manage health-related needs after discharge will improve Outcome: Progressing

## 2024-09-29 ENCOUNTER — Other Ambulatory Visit: Payer: Self-pay

## 2024-09-29 ENCOUNTER — Other Ambulatory Visit (HOSPITAL_COMMUNITY): Payer: Self-pay

## 2024-09-29 LAB — BASIC METABOLIC PANEL WITH GFR
Anion gap: 9 (ref 5–15)
BUN: 13 mg/dL (ref 6–20)
CO2: 26 mmol/L (ref 22–32)
Calcium: 8.7 mg/dL — ABNORMAL LOW (ref 8.9–10.3)
Chloride: 102 mmol/L (ref 98–111)
Creatinine, Ser: 0.94 mg/dL (ref 0.44–1.00)
GFR, Estimated: >60 mL/min (ref >60)
Glucose, Bld: 144 mg/dL — ABNORMAL HIGH (ref 70–99)
Potassium: 4.1 mmol/L (ref 3.5–5.1)
Sodium: 137 mmol/L (ref 135–145)

## 2024-09-29 LAB — CBC
HCT: 33.2 % — ABNORMAL LOW (ref 36.0–46.0)
Hemoglobin: 11.1 g/dL — ABNORMAL LOW (ref 12.0–15.0)
MCH: 31.2 pg (ref 26.0–34.0)
MCHC: 33.4 g/dL (ref 30.0–36.0)
MCV: 93.3 fL (ref 80.0–100.0)
Platelets: 359 K/uL (ref 150–400)
RBC: 3.56 MIL/uL — ABNORMAL LOW (ref 3.87–5.11)
RDW: 12.6 % (ref 11.5–15.5)
WBC: 12.4 K/uL — ABNORMAL HIGH (ref 4.0–10.5)
nRBC: 0 % (ref 0.0–0.2)

## 2024-09-29 LAB — GLUCOSE, CAPILLARY
Glucose-Capillary: 145 mg/dL — ABNORMAL HIGH (ref 70–99)
Glucose-Capillary: 153 mg/dL — ABNORMAL HIGH (ref 70–99)
Glucose-Capillary: 168 mg/dL — ABNORMAL HIGH (ref 70–99)

## 2024-09-29 LAB — MAGNESIUM: Magnesium: 1.9 mg/dL (ref 1.7–2.4)

## 2024-09-29 MED ORDER — METOPROLOL SUCCINATE ER 25 MG PO TB24: 25.0000 mg | ORAL_TABLET | Freq: Every day | ORAL | 0 refills | Status: DC

## 2024-09-29 MED ORDER — MAGNESIUM SULFATE 2 GM/50ML IV SOLN
2.0000 g | Freq: Once | INTRAVENOUS | Status: AC
  Administered 2024-09-29: 2 g via INTRAVENOUS
  Filled 2024-09-29: qty 50

## 2024-09-29 MED ORDER — SOTALOL HCL 80 MG PO TABS
80.0000 mg | ORAL_TABLET | Freq: Two times a day (BID) | ORAL | 5 refills | Status: DC
  Filled 2024-09-29: qty 60, 30d supply, fill #0

## 2024-09-29 MED ORDER — METOPROLOL SUCCINATE ER 25 MG PO TB24
25.0000 mg | ORAL_TABLET | Freq: Every day | ORAL | 0 refills | Status: AC
  Filled 2024-09-29: qty 30, 30d supply, fill #0

## 2024-09-29 MED ORDER — SOTALOL HCL 80 MG PO TABS: 80.0000 mg | ORAL_TABLET | Freq: Two times a day (BID) | ORAL | 0 refills | Status: DC

## 2024-09-29 NOTE — Discharge Summary (Signed)
 Physician Discharge Summary  Joanne Harris FMW:[id number] DOB: Mar 17, 1981 DOA: 09/25/2024  PCP: Mahlon Comer BRAVO, MD  Admit date: 09/25/2024  Discharge date: 09/29/2024  Admitted From: Home  Disposition:  Home  Recommendations for Outpatient Follow-up:  Follow up with PCP in 1-2 weeks. Please obtain BMP/CBC in one week. Advised to take metoprolol  25 mg daily. Advised to take sotalol  80 mg every 12 hours for monomorphic VT. Advised to follow-up with Cardiology as scheduled. Advised to follow-up with endocrinologist as scheduled.  Home Health: None Equipment/Devices:None  Discharge Condition: Stable CODE STATUS:Full code Diet recommendation: Heart Healthy   Brief Summary / Hospital Course: This 43 yrs old female with PMH significant for ventricular tachycardia, Chronic diastolic heart failure, Hypothyroidism, type 2 diabetes presented in the ED with an episode of ventricular tachycardia,  status post cardioversion to sinus rhythm. She reports having chest pressure associated with palpitations. She reports having similar presentation in September 2020 where she was found to be in V. tach in the setting of large pericardial effusion felt to be due to the hypothyroidism. EDP discussed the case with cardiologist recommended amiodarone infusion if patient has subsequent runs of V. tach. Cardiology thinks the presentation is less suggestive of ACS so heparin  was discontinued. She reports feeling better, denies any palpitations. Patient started on metoprolol  XL 25 mg twice daily. Amiodarone infusion was also kept on hold unless it recurs. Echocardiogram completed shows LVEF 65 to 70%, small pericardial effusion. Cardiology started patient on sotalol  80 mg BID, so far patient has received 5 doses of sotalol , post dose EKG with normal QT interval.  Patient feels better and wants to be discharged home.  Cardiology signed off,  recommended outpatient follow-up.  Discharge Diagnoses:   Principal Problem:   Ventricular tachycardia (HCC) Active Problems:   Acquired hypothyroidism   DM2 (diabetes mellitus, type 2) (HCC)   Leukocytosis   Hypomagnesemia  Monomorphic ventricular tachycardia :  Patient did have similar presentation in September 2024 this was in the setting of profound hypothyroidism.   She has been cardioverted to NSR.  Denies syncope. Continued on Toprol  50 mg twice daily. Cardiology started sotalol  80 mg twice daily, she has received 5 doses. Cardiology recommended c MRI as an outpatient. Toprol  reduced to 25 mg daily. Patient received 5 doses of sotalol  so far.  Post dose EKG normal QT interval.   Hypothyroidism: Patient reports medication compliance.  TSH is still elevated. Continue levothyroxine  200 mcg daily. Follow up Outpatient endocrinologist.   Pericardial effusion: As described small on echo. Continue monitoring.   Diabetes mellitus ii : Carb modified diet. Resume home medications.   Obesity: Diet and exercise discussed in detail.   Hypomagnesemia: replaced.  Continue to monitor   Leukocytosis likely reactive.  Discharge Instructions  Discharge Instructions     Call MD for:  difficulty breathing, headache or visual disturbances   Complete by: As directed    Call MD for:  persistant dizziness or light-headedness   Complete by: As directed    Call MD for:  persistant nausea and vomiting   Complete by: As directed    Diet - low sodium heart healthy   Complete by: As directed    Diet Carb Modified   Complete by: As directed    Discharge instructions   Complete by: As directed    Follow-up with primary care physician in 1 week. Advised to take metoprolol  25 mg daily. Patient started on sotalol  80 mg every 12 hours for monomorphic VT. Advised to  follow-up with cardiology as scheduled.   Increase activity slowly   Complete by: As directed       Allergies as of 09/29/2024   No Known Allergies      Medication List      TAKE these medications    ADVIL PM PO Take 3 tablets by mouth at bedtime as needed (pain, sleep).   buPROPion  150 MG 24 hr tablet Commonly known as: WELLBUTRIN  XL Take 150 mg by mouth daily.   levothyroxine  200 MCG tablet Commonly known as: SYNTHROID  Take 1 tablet (200 mcg total) by mouth daily. What changed: when to take this   metoprolol  succinate 25 MG 24 hr tablet Commonly known as: TOPROL -XL Take 1 tablet (25 mg total) by mouth daily. Start taking on: September 30, 2024   sertraline  100 MG tablet Commonly known as: ZOLOFT  Take 100 mg by mouth daily.   sotalol  80 MG tablet Commonly known as: BETAPACE  Take 1 tablet (80 mg total) by mouth every 12 (twelve) hours.   spironolactone  25 MG tablet Commonly known as: ALDACTONE  Take 1 tablet (25 mg total) by mouth daily. What changed:  when to take this reasons to take this   tirzepatide  5 MG/0.5ML Pen Commonly known as: MOUNJARO  Inject 5 mg into the skin once a week. What changed: when to take this        Follow-up Information     Tabori, Katherine E, MD Follow up in 1 week(s).   Specialty: Family Medicine Contact information: [address] [place] [address] [phone]         Barbaraann Darryle Ned, MD Follow up in 1 week(s).   Specialties: Cardiology, Internal Medicine, Radiology Contact information: [address] [place] [address] [phone]                No Known Allergies  Consultations: Cardiology   Procedures/Studies: ECHOCARDIOGRAM COMPLETE Result Date: 09/26/2024    ECHOCARDIOGRAM REPORT   Patient Name:   Joanne Harris Date of Exam: 09/26/2024 Medical Rec #:  [id number]           Height:       64.5 in Accession #:    [id number]          Weight:       286.4 lb Date of Birth:  23-Feb-1981          BSA:          2.291 m Patient Age:    43 years            BP:           115/61 mmHg Patient Gender: F                   HR:           86 bpm. Exam Location:   Inpatient Procedure: 2D Echo and Intracardiac Opacification Agent (Both Spectral and Color            Flow Doppler were utilized during procedure). Indications:    chest pain  History:        Patient has prior history of Echocardiogram examinations, most                 recent 12/01/2023. Arrythmias:V-tach; Risk Factors:Diabetes,                 Hypertension and Dyslipidemia.  Sonographer:    Tinnie Barefoot RDCS Referring Phys: [id number] EVA KATHEE PORE  Sonographer Comments: Patient  is obese. Image acquisition challenging due to patient body habitus. IMPRESSIONS  1. Left ventricular ejection fraction, by estimation, is 60 to 65%. The left ventricle has normal function. The left ventricle has no regional wall motion abnormalities. There is mild concentric left ventricular hypertrophy. Left ventricular diastolic parameters were normal.  2. Right ventricular systolic function is normal. The right ventricular size is normal.  3. A small pericardial effusion is present. The pericardial effusion is circumferential. There is no evidence of cardiac tamponade.  4. The mitral valve is normal in structure. No evidence of mitral valve regurgitation. No evidence of mitral stenosis.  5. The aortic valve is normal in structure. Aortic valve regurgitation is not visualized. No aortic stenosis is present.  6. The inferior vena cava is normal in size with greater than 50% respiratory variability, suggesting right atrial pressure of 3 mmHg. FINDINGS  Left Ventricle: Left ventricular ejection fraction, by estimation, is 60 to 65%. The left ventricle has normal function. The left ventricle has no regional wall motion abnormalities. Definity  contrast agent was given IV to delineate the left ventricular  endocardial borders. The left ventricular internal cavity size was normal in size. There is mild concentric left ventricular hypertrophy. Left ventricular diastolic parameters were normal. Right Ventricle: The right ventricular size is  normal. No increase in right ventricular wall thickness. Right ventricular systolic function is normal. Left Atrium: Left atrial size was normal in size. Right Atrium: Right atrial size was normal in size. Pericardium: A small pericardial effusion is present. The pericardial effusion is circumferential. There is no evidence of cardiac tamponade. Presence of epicardial fat layer. Mitral Valve: The mitral valve is normal in structure. No evidence of mitral valve regurgitation. No evidence of mitral valve stenosis. Tricuspid Valve: The tricuspid valve is normal in structure. Tricuspid valve regurgitation is not demonstrated. No evidence of tricuspid stenosis. Aortic Valve: The aortic valve is normal in structure. Aortic valve regurgitation is not visualized. No aortic stenosis is present. Pulmonic Valve: The pulmonic valve was normal in structure. Pulmonic valve regurgitation is not visualized. No evidence of pulmonic stenosis. Aorta: The aortic root is normal in size and structure. Venous: The inferior vena cava is normal in size with greater than 50% respiratory variability, suggesting right atrial pressure of 3 mmHg. IAS/Shunts: No atrial level shunt detected by color flow Doppler.  LEFT VENTRICLE PLAX 2D LVIDd:         4.30 cm   Diastology LVIDs:         2.60 cm   LV e' medial:    7.40 cm/s LV PW:         1.10 cm   LV E/e' medial:  10.7 LV IVS:        1.20 cm   LV e' lateral:   6.64 cm/s LVOT diam:     2.00 cm   LV E/e' lateral: 11.9 LV SV:         64 LV SV Index:   28 LVOT Area:     3.14 cm  RIGHT VENTRICLE             IVC RV Basal diam:  2.20 cm     IVC diam: 1.90 cm RV S prime:     15.20 cm/s TAPSE (M-mode): 1.8 cm LEFT ATRIUM             Index        RIGHT ATRIUM          Index LA diam:  4.50 cm 1.96 cm/m   RA Area:     8.86 cm LA Vol (A2C):   50.7 ml 22.13 ml/m  RA Volume:   15.90 ml 6.94 ml/m LA Vol (A4C):   41.7 ml 18.20 ml/m LA Biplane Vol: 47.3 ml 20.65 ml/m  AORTIC VALVE LVOT Vmax:   108.00  cm/s LVOT Vmean:  72.800 cm/s LVOT VTI:    0.204 m  AORTA Ao Root diam: 2.50 cm Ao Asc diam:  2.90 cm MITRAL VALVE MV Area (PHT): 3.99 cm    SHUNTS MV Decel Time: 190 msec    Systemic VTI:  0.20 m MV E velocity: 79.30 cm/s  Systemic Diam: 2.00 cm MV A velocity: 93.00 cm/s MV E/A ratio:  0.85 Kardie Tobb DO Electronically signed by Dub Huntsman DO Signature Date/Time: 09/26/2024/9:41:18 AM    Final    DG Chest Port 1 View Result Date: 09/25/2024 CLINICAL DATA:  Chest pain EXAM: PORTABLE CHEST 1 VIEW COMPARISON:  Chest x-ray 07/16/2023 FINDINGS: The heart size and mediastinal contours are within normal limits. Both lungs are clear. The visualized skeletal structures are unremarkable. IMPRESSION: No active disease. Electronically Signed   By: Greig Pique M.D.   On: 09/25/2024 18:40    Subjective: Patient was seen and examined at bedside. Overnight events noted. Patient reports feeling much improved.  Denies any chest pain,  shortness of breath or palpitations or dizziness.   She wants to be discharged home.  Discharge Exam: Vitals:   09/29/24 0811 09/29/24 1221  BP: (!) 120/96 131/85  Pulse: 78 71  Resp: 19 19  Temp: 98.7 F (37.1 C) 99.2 F (37.3 C)  SpO2: 100% 100%   Vitals:   09/28/24 2001 09/29/24 0512 09/29/24 0811 09/29/24 1221  BP: 127/79 130/83 (!) 120/96 131/85  Pulse: 80 72 78 71  Resp: 18 16 19 19   Temp: 99 F (37.2 C) 98.5 F (36.9 C) 98.7 F (37.1 C) 99.2 F (37.3 C)  TempSrc: Oral Oral Oral Oral  SpO2: 100% 99% 100% 100%  Weight:  127.7 kg    Height:        General: Pt is alert, awake, not in acute distress Cardiovascular: RRR, S1/S2 +, no rubs, no gallops Respiratory: CTA bilaterally, no wheezing, no rhonchi Abdominal: Soft, NT, ND, bowel sounds + Extremities: no edema, no cyanosis    The results of significant diagnostics from this hospitalization (including imaging, microbiology, ancillary and laboratory) are listed below for reference.      Microbiology: No results found for this or any previous visit (from the past 240 hours).   Labs: BNP (last 3 results) Recent Labs    09/26/24 0431  BNP 90.1   Basic Metabolic Panel: Recent Labs  Lab 09/25/24 1725 09/26/24 0431 09/27/24 0428 09/28/24 0446 09/29/24 0412  NA 135 135 137 135 137  K 3.8 3.3* 3.9 4.0 4.1  CL 98 105 102 105 102  CO2 23 21* 25 22 26   GLUCOSE 288* 184* 184* 141* 144*  BUN 17 13 12 11 13   CREATININE 0.86 0.82 0.73 0.73 0.94  CALCIUM 9.7 8.4* 8.4* 8.6* 8.7*  MG 1.6* 1.9 2.0 2.1 1.9  PHOS  --   --  2.9  --   --    Liver Function Tests: Recent Labs  Lab 09/26/24 0431  AST 14*  ALT 16  ALKPHOS 55  BILITOT 0.5  PROT 6.4*  ALBUMIN 3.1*   No results for input(s): LIPASE, AMYLASE in the last 168 hours. No results  for input(s): AMMONIA in the last 168 hours. CBC: Recent Labs  Lab 09/25/24 1725 09/27/24 0428 09/28/24 0446 09/29/24 0412  WBC 14.9* 11.9* 12.5* 12.4*  HGB 13.4 11.9* 12.1 11.1*  HCT 39.5 35.2* 36.9 33.2*  MCV 90.8 92.6 94.4 93.3  PLT 438* 348 391 359   Cardiac Enzymes: No results for input(s): CKTOTAL, CKMB, CKMBINDEX, TROPONINI in the last 168 hours. BNP: Invalid input(s): POCBNP CBG: Recent Labs  Lab 09/28/24 1232 09/28/24 1843 09/29/24 0024 09/29/24 0509 09/29/24 1230  GLUCAP 165* 153* 153* 168* 145*   D-Dimer No results for input(s): DDIMER in the last 72 hours. Hgb A1c No results for input(s): HGBA1C in the last 72 hours. Lipid Profile No results for input(s): CHOL, HDL, LDLCALC, TRIG, CHOLHDL, LDLDIRECT in the last 72 hours. Thyroid  function studies Recent Labs    09/27/24 1852  TSH 18.968*  T3FREE 1.3*   Anemia work up No results for input(s): VITAMINB12, FOLATE, FERRITIN, TIBC, IRON, RETICCTPCT in the last 72 hours. Urinalysis    Component Value Date/Time   COLORURINE YELLOW 09/26/2024 0214   APPEARANCEUR HAZY (A) 09/26/2024 0214   LABSPEC 1.029  09/26/2024 0214   PHURINE 5.0 09/26/2024 0214   GLUCOSEU 50 (A) 09/26/2024 0214   HGBUR NEGATIVE 09/26/2024 0214   BILIRUBINUR NEGATIVE 09/26/2024 0214   BILIRUBINUR negative 12/03/2017 0912   KETONESUR NEGATIVE 09/26/2024 0214   PROTEINUR 30 (A) 09/26/2024 0214   UROBILINOGEN 0.2 12/03/2017 0912   NITRITE NEGATIVE 09/26/2024 0214   LEUKOCYTESUR NEGATIVE 09/26/2024 0214   Sepsis Labs Recent Labs  Lab 09/25/24 1725 09/27/24 0428 09/28/24 0446 09/29/24 0412  WBC 14.9* 11.9* 12.5* 12.4*   Microbiology No results found for this or any previous visit (from the past 240 hours).   Time coordinating discharge: Over 30 minutes  SIGNED:   Darcel Dawley, MD  [facility] 09/29/2024, 4:12 PM Pager   If 7PM-7AM, please contact night-coverage

## 2024-09-29 NOTE — Progress Notes (Signed)
 Post dose EKG is reviewed. SR, QTc stable OK to discharge from EP perspective Follow up is in place  Discharge on Sotalol  80mg  BID and her home metoprolol  25mg  at [facility]  Charlies Arthur, PA-C

## 2024-09-29 NOTE — Discharge Instructions (Signed)
 Follow-up with primary care physician in 1 week. Advised to take metoprolol  25 mg daily. Patient started on sotalol  80 mg every 12 hours for monomorphic VT. Advised to follow-up with cardiology as scheduled.

## 2024-09-29 NOTE — Progress Notes (Signed)
 Patient states all belongings brought to the hospital at time of admission are accounted for and packed to take home.  Picked up medications from [facility] pharmacy. [facility] contacted to transport patient to Discharge lounge to wait for transportation home.

## 2024-09-29 NOTE — Progress Notes (Signed)
 Post dose EKG and telemetry reviewed QTc stable, PVC burden is improved, no VT Anticipate discharge later today  Charlies Arthur, PA-C

## 2024-09-29 NOTE — Plan of Care (Signed)
 Problem: Education: Goal: Knowledge of General Education information will improve Description: Including pain rating scale, medication(s)/side effects and non-pharmacologic comfort measures 09/29/2024 1340 by Elgin Lonni BRAVO, RN Outcome: Adequate for Discharge 09/29/2024 1340 by Elgin Lonni BRAVO, RN Outcome: Adequate for Discharge   Problem: Health Behavior/Discharge Planning: Goal: Ability to manage health-related needs will improve 09/29/2024 1340 by Elgin Lonni BRAVO, RN Outcome: Adequate for Discharge 09/29/2024 1340 by Elgin Lonni BRAVO, RN Outcome: Adequate for Discharge   Problem: Clinical Measurements: Goal: Ability to maintain clinical measurements within normal limits will improve 09/29/2024 1340 by Elgin Lonni BRAVO, RN Outcome: Adequate for Discharge 09/29/2024 1340 by Elgin Lonni BRAVO, RN Outcome: Adequate for Discharge Goal: Will remain free from infection 09/29/2024 1340 by Elgin Lonni BRAVO, RN Outcome: Adequate for Discharge 09/29/2024 1340 by Elgin Lonni BRAVO, RN Outcome: Adequate for Discharge Goal: Diagnostic test results will improve 09/29/2024 1340 by Elgin Lonni BRAVO, RN Outcome: Adequate for Discharge 09/29/2024 1340 by Elgin Lonni BRAVO, RN Outcome: Adequate for Discharge Goal: Respiratory complications will improve 09/29/2024 1340 by Elgin Lonni BRAVO, RN Outcome: Adequate for Discharge 09/29/2024 1340 by Elgin Lonni BRAVO, RN Outcome: Adequate for Discharge Goal: Cardiovascular complication will be avoided 09/29/2024 1340 by Elgin Lonni BRAVO, RN Outcome: Adequate for Discharge 09/29/2024 1340 by Elgin Lonni BRAVO, RN Outcome: Adequate for Discharge   Problem: Activity: Goal: Risk for activity intolerance will decrease 09/29/2024 1340 by Elgin Lonni BRAVO, RN Outcome: Adequate for Discharge 09/29/2024 1340 by Elgin Lonni BRAVO, RN Outcome: Adequate for Discharge   Problem: Nutrition: Goal: Adequate  nutrition will be maintained 09/29/2024 1340 by Elgin Lonni BRAVO, RN Outcome: Adequate for Discharge 09/29/2024 1340 by Elgin Lonni BRAVO, RN Outcome: Adequate for Discharge   Problem: Coping: Goal: Level of anxiety will decrease 09/29/2024 1340 by Elgin Lonni BRAVO, RN Outcome: Adequate for Discharge 09/29/2024 1340 by Elgin Lonni BRAVO, RN Outcome: Adequate for Discharge   Problem: Elimination: Goal: Will not experience complications related to bowel motility 09/29/2024 1340 by Elgin Lonni BRAVO, RN Outcome: Adequate for Discharge 09/29/2024 1340 by Elgin Lonni BRAVO, RN Outcome: Adequate for Discharge Goal: Will not experience complications related to urinary retention 09/29/2024 1340 by Elgin Lonni BRAVO, RN Outcome: Adequate for Discharge 09/29/2024 1340 by Elgin Lonni BRAVO, RN Outcome: Adequate for Discharge   Problem: Pain Managment: Goal: General experience of comfort will improve and/or be controlled 09/29/2024 1340 by Elgin Lonni BRAVO, RN Outcome: Adequate for Discharge 09/29/2024 1340 by Elgin Lonni BRAVO, RN Outcome: Adequate for Discharge   Problem: Safety: Goal: Ability to remain free from injury will improve 09/29/2024 1340 by Elgin Lonni BRAVO, RN Outcome: Adequate for Discharge 09/29/2024 1340 by Elgin Lonni BRAVO, RN Outcome: Adequate for Discharge   Problem: Skin Integrity: Goal: Risk for impaired skin integrity will decrease 09/29/2024 1340 by Elgin Lonni BRAVO, RN Outcome: Adequate for Discharge 09/29/2024 1340 by Elgin Lonni BRAVO, RN Outcome: Adequate for Discharge   Problem: Education: Goal: Ability to describe self-care measures that may prevent or decrease complications (Diabetes Survival Skills Education) will improve 09/29/2024 1340 by Elgin Lonni BRAVO, RN Outcome: Adequate for Discharge 09/29/2024 1340 by Elgin Lonni BRAVO, RN Outcome: Adequate for Discharge Goal: Individualized Educational  Video(s) 09/29/2024 1340 by Elgin Lonni BRAVO, RN Outcome: Adequate for Discharge 09/29/2024 1340 by Elgin Lonni BRAVO, RN Outcome: Adequate for Discharge   Problem: Coping: Goal: Ability to adjust to condition or change in health will improve 09/29/2024 1340 by Elgin Lonni BRAVO, RN Outcome: Adequate for Discharge 09/29/2024 1340 by  Elgin Lonni BRAVO, RN Outcome: Adequate for Discharge   Problem: Fluid Volume: Goal: Ability to maintain a balanced intake and output will improve 09/29/2024 1340 by Elgin Lonni BRAVO, RN Outcome: Adequate for Discharge 09/29/2024 1340 by Elgin Lonni BRAVO, RN Outcome: Adequate for Discharge   Problem: Health Behavior/Discharge Planning: Goal: Ability to identify and utilize available resources and services will improve 09/29/2024 1340 by Elgin Lonni BRAVO, RN Outcome: Adequate for Discharge 09/29/2024 1340 by Elgin Lonni BRAVO, RN Outcome: Adequate for Discharge Goal: Ability to manage health-related needs will improve 09/29/2024 1340 by Elgin Lonni BRAVO, RN Outcome: Adequate for Discharge 09/29/2024 1340 by Elgin Lonni BRAVO, RN Outcome: Adequate for Discharge   Problem: Metabolic: Goal: Ability to maintain appropriate glucose levels will improve 09/29/2024 1340 by Elgin Lonni BRAVO, RN Outcome: Adequate for Discharge 09/29/2024 1340 by Elgin Lonni BRAVO, RN Outcome: Adequate for Discharge   Problem: Nutritional: Goal: Maintenance of adequate nutrition will improve 09/29/2024 1340 by Elgin Lonni BRAVO, RN Outcome: Adequate for Discharge 09/29/2024 1340 by Elgin Lonni BRAVO, RN Outcome: Adequate for Discharge Goal: Progress toward achieving an optimal weight will improve 09/29/2024 1340 by Elgin Lonni BRAVO, RN Outcome: Adequate for Discharge 09/29/2024 1340 by Elgin Lonni BRAVO, RN Outcome: Adequate for Discharge   Problem: Skin Integrity: Goal: Risk for impaired skin integrity will  decrease 09/29/2024 1340 by Elgin Lonni BRAVO, RN Outcome: Adequate for Discharge 09/29/2024 1340 by Elgin Lonni BRAVO, RN Outcome: Adequate for Discharge   Problem: Tissue Perfusion: Goal: Adequacy of tissue perfusion will improve 09/29/2024 1340 by Elgin Lonni BRAVO, RN Outcome: Adequate for Discharge 09/29/2024 1340 by Elgin Lonni BRAVO, RN Outcome: Adequate for Discharge   Problem: Education: Goal: Knowledge of disease or condition will improve 09/29/2024 1340 by Elgin Lonni BRAVO, RN Outcome: Adequate for Discharge 09/29/2024 1340 by Elgin Lonni BRAVO, RN Outcome: Adequate for Discharge Goal: Understanding of medication regimen will improve 09/29/2024 1340 by Elgin Lonni BRAVO, RN Outcome: Adequate for Discharge 09/29/2024 1340 by Elgin Lonni BRAVO, RN Outcome: Adequate for Discharge Goal: Individualized Educational Video(s) 09/29/2024 1340 by Elgin Lonni BRAVO, RN Outcome: Adequate for Discharge 09/29/2024 1340 by Elgin Lonni BRAVO, RN Outcome: Adequate for Discharge   Problem: Activity: Goal: Ability to tolerate increased activity will improve 09/29/2024 1340 by Elgin Lonni BRAVO, RN Outcome: Adequate for Discharge 09/29/2024 1340 by Elgin Lonni BRAVO, RN Outcome: Adequate for Discharge   Problem: Cardiac: Goal: Ability to achieve and maintain adequate cardiopulmonary perfusion will improve 09/29/2024 1340 by Elgin Lonni BRAVO, RN Outcome: Adequate for Discharge 09/29/2024 1340 by Elgin Lonni BRAVO, RN Outcome: Adequate for Discharge   Problem: Health Behavior/Discharge Planning: Goal: Ability to safely manage health-related needs after discharge will improve 09/29/2024 1340 by Elgin Lonni BRAVO, RN Outcome: Adequate for Discharge 09/29/2024 1340 by Elgin Lonni BRAVO, RN Outcome: Adequate for Discharge

## 2024-09-29 NOTE — Plan of Care (Signed)
  Problem: Education: Goal: Knowledge of General Education information will improve Description: Including pain rating scale, medication(s)/side effects and non-pharmacologic comfort measures Outcome: Progressing   Problem: Health Behavior/Discharge Planning: Goal: Ability to manage health-related needs will improve Outcome: Progressing   Problem: Clinical Measurements: Goal: Ability to maintain clinical measurements within normal limits will improve Outcome: Progressing Goal: Will remain free from infection Outcome: Progressing Goal: Diagnostic test results will improve Outcome: Progressing Goal: Respiratory complications will improve Outcome: Progressing Goal: Cardiovascular complication will be avoided Outcome: Progressing   Problem: Activity: Goal: Risk for activity intolerance will decrease Outcome: Progressing   Problem: Nutrition: Goal: Adequate nutrition will be maintained Outcome: Progressing   Problem: Coping: Goal: Level of anxiety will decrease Outcome: Progressing   Problem: Elimination: Goal: Will not experience complications related to bowel motility Outcome: Progressing Goal: Will not experience complications related to urinary retention Outcome: Progressing   Problem: Pain Managment: Goal: General experience of comfort will improve and/or be controlled Outcome: Progressing   Problem: Safety: Goal: Ability to remain free from injury will improve Outcome: Progressing   Problem: Skin Integrity: Goal: Risk for impaired skin integrity will decrease Outcome: Progressing   Problem: Education: Goal: Ability to describe self-care measures that may prevent or decrease complications (Diabetes Survival Skills Education) will improve Outcome: Progressing Goal: Individualized Educational Video(s) Outcome: Progressing   Problem: Coping: Goal: Ability to adjust to condition or change in health will improve Outcome: Progressing   Problem: Fluid  Volume: Goal: Ability to maintain a balanced intake and output will improve Outcome: Progressing   Problem: Health Behavior/Discharge Planning: Goal: Ability to identify and utilize available resources and services will improve Outcome: Progressing Goal: Ability to manage health-related needs will improve Outcome: Progressing   Problem: Metabolic: Goal: Ability to maintain appropriate glucose levels will improve Outcome: Progressing   Problem: Nutritional: Goal: Maintenance of adequate nutrition will improve Outcome: Progressing Goal: Progress toward achieving an optimal weight will improve Outcome: Progressing   Problem: Skin Integrity: Goal: Risk for impaired skin integrity will decrease Outcome: Progressing   Problem: Tissue Perfusion: Goal: Adequacy of tissue perfusion will improve Outcome: Progressing   Problem: Education: Goal: Knowledge of disease or condition will improve Outcome: Progressing Goal: Understanding of medication regimen will improve Outcome: Progressing Goal: Individualized Educational Video(s) Outcome: Progressing   Problem: Activity: Goal: Ability to tolerate increased activity will improve Outcome: Progressing   Problem: Cardiac: Goal: Ability to achieve and maintain adequate cardiopulmonary perfusion will improve Outcome: Progressing   Problem: Health Behavior/Discharge Planning: Goal: Ability to safely manage health-related needs after discharge will improve Outcome: Progressing

## 2024-09-29 NOTE — Progress Notes (Signed)
 Pharmacy Consult for Sotalol  Electrolyte Replacement- Follow Up  Pharmacy consulted to assist in monitoring and replacing electrolytes in this 43 y.o. female admitted on 09/25/2024 undergoing sotalol  initiation .   Labs:    Component Value Date/Time   K 4.1 09/29/2024 0412   MG 1.9 09/29/2024 0412     Plan: Potassium: K >/= 4: No additional supplementation needed  Magnesium : Mg 1.8-2: Give Mg 2 gm IV x1    No oral potassium supplementation recommended at discharge  Thank you for allowing pharmacy to participate in this patient's care   Prentice Poisson, PharmD Clinical Pharmacist **Pharmacist phone directory can now be found on [email or web address] ([email or web address]).  Listed under [facility].

## 2024-09-29 NOTE — Progress Notes (Addendum)
 Rounding Note   Patient Name: Joanne Harris Date of Encounter: 09/29/2024  [facility] Cardiologist: Darryle ONEIDA Decent, MD   Subjective  No new concerns, looking forward to discharge  Scheduled Meds:  buPROPion   150 mg Oral Daily   feeding supplement  237 mL Oral BID BM   insulin  aspart  0-9 Units Subcutaneous Q6H   levothyroxine   200 mcg Oral QAC breakfast   metoprolol  succinate  25 mg Oral Daily   sertraline   100 mg Oral Daily   sodium chloride  flush  3 mL Intravenous Q12H   sotalol   80 mg Oral Q12H   Continuous Infusions:  magnesium  sulfate bolus IVPB     PRN Meds: acetaminophen  **OR** acetaminophen , melatonin, sodium chloride  flush   Vital Signs  Vitals:   09/28/24 1647 09/28/24 2001 09/29/24 0512 09/29/24 0811  BP: 129/74 127/79 130/83 (!) 120/96  Pulse: 83 80 72 78  Resp: 18 18 16 19   Temp: 99 F (37.2 C) 99 F (37.2 C) 98.5 F (36.9 C) 98.7 F (37.1 C)  TempSrc: Oral Oral Oral Oral  SpO2: 95% 100% 99% 100%  Weight:   127.7 kg   Height:        Intake/Output Summary (Last 24 hours) at 09/29/2024 1008 Last data filed at 09/29/2024 0514 Gross per 24 hour  Intake 480 ml  Output --  Net 480 ml      09/29/2024    5:12 AM 09/28/2024    4:16 AM 09/27/2024    4:50 AM  Last 3 Weights  Weight (lbs) 281 lb 9.6 oz 281 lb 14.4 oz 284 lb 9.6 oz  Weight (kg) 127.733 kg 127.869 kg 129.094 kg      Telemetry   SR, PVCs burden remains improved - Personally Reviewed  ECG   SR 73bpm, QTc  467  - Personally Reviewed w/ Dr. Kennyth  Physical Exam  Remains unchanged exam from yesterday GEN: No acute distress.   Neck: No JVD Cardiac: RRR, no murmurs, rubs, or gallops.  Respiratory: CTA b/l. GI: Soft, nontender, non-distended  MS: No edema; No deformity. Neuro:  Nonfocal  Psych: Normal affect    Labs High Sensitivity Troponin:   Recent Labs  Lab 09/26/24 0439  TROPONINIHS 20*     Chemistry Recent Labs  Lab 09/26/24 0431 09/27/24 0428  09/28/24 0446 09/29/24 0412  NA 135 137 135 137  K 3.3* 3.9 4.0 4.1  CL 105 102 105 102  CO2 21* 25 22 26   GLUCOSE 184* 184* 141* 144*  BUN 13 12 11 13   CREATININE 0.82 0.73 0.73 0.94  CALCIUM 8.4* 8.4* 8.6* 8.7*  MG 1.9 2.0 2.1 1.9  PROT 6.4*  --   --   --   ALBUMIN 3.1*  --   --   --   AST 14*  --   --   --   ALT 16  --   --   --   ALKPHOS 55  --   --   --   BILITOT 0.5  --   --   --   GFRNONAA >60 >60 >60 >60  ANIONGAP 9 10 8 9     Lipids No results for input(s): CHOL, TRIG, HDL, LABVLDL, LDLCALC, CHOLHDL in the last 168 hours.  Hematology Recent Labs  Lab 09/27/24 0428 09/28/24 0446 09/29/24 0412  WBC 11.9* 12.5* 12.4*  RBC 3.80* 3.91 3.56*  HGB 11.9* 12.1 11.1*  HCT 35.2* 36.9 33.2*  MCV 92.6 94.4 93.3  MCH 31.3  30.9 31.2  MCHC 33.8 32.8 33.4  RDW 12.6 12.6 12.6  PLT 348 391 359   Thyroid   Recent Labs  Lab 09/27/24 1852  TSH 18.968*  FREET4 0.66    BNP Recent Labs  Lab 09/26/24 0431  BNP 90.1    DDimer No results for input(s): DDIMER in the last 168 hours.   Radiology    Cardiac Studies  09/26/24: TTE 1. Left ventricular ejection fraction, by estimation, is 60 to 65%. The  left ventricle has normal function. The left ventricle has no regional  wall motion abnormalities. There is mild concentric left ventricular  hypertrophy. Left ventricular diastolic  parameters were normal.   2. Right ventricular systolic function is normal. The right ventricular  size is normal.   3. A small pericardial effusion is present. The pericardial effusion is  circumferential. There is no evidence of cardiac tamponade.   4. The mitral valve is normal in structure. No evidence of mitral valve  regurgitation. No evidence of mitral stenosis.   5. The aortic valve is normal in structure. Aortic valve regurgitation is  not visualized. No aortic stenosis is present.   6. The inferior vena cava is normal in size with greater than 50%  respiratory  variability, suggesting right atrial pressure of 3 mmHg.      Monitor 1 HR 72 - 113, average 79 bpm. No atrial fibrillation detected. Rare supraventricular ectopy. Rare ventricular ectopy. No sustained arrhythmias. Symptom trigger episodes correspond to normal sinus rhythm.   Monitor 2 HR 66 - 143, average 75 bpm. 13 nonsustained VT (longest 5 beats) No atrial fibrillation detected. Rare supraventricular ectopy. Frequent ventricular ectopy, 5%. No sustained arrhythmias. Symptom trigger episodes correspond to PVCs.   RHC/LHC 07/19/23:  Conclusions: No angiographically significant coronary artery disease. Mildly to moderately elevated left heart and pulmonary artery pressures. Severely elevated right heart filling pressures. Normal Fick cardiac output/index. Hemodynamics with equivocal findings of cardiac tamponade (absent y-descent noted on RA pressure tracing, elevated right heart pressures not completely equalized with left heart pressures, and concordant LV/RV pressures). Recommendations: Proceed with cardiac MRI. Favor deferring drainage of pericardial effusion at this time, given lack of symptoms and equivocal hemodynamic findings of tamponade physiology. Primary prevention of coronary artery disease.   Echo 07/17/23:  1. Limited echo windows- Definity  contrast was not administered. Left  ventricular ejection fraction, by estimation, is 60 to 65%. The left  ventricle has normal function. The left ventricle has no regional wall  motion abnormalities. There is moderate  left ventricular hypertrophy. Left ventricular diastolic parameters are  consistent with Grade I diastolic dysfunction (impaired relaxation).   2. Poorly visualized, appears underfilled. RA and RV collapse noted. RVEF  is reduced. Right ventricular systolic function is moderately reduced. The  right ventricular size is small. There is normal pulmonary artery systolic  pressure. The estimated right    ventricular systolic pressure is 11.7 mmHg.   3. Cannot rule-out tamponade physiology. Large pericardial effusion. The  pericardial effusion is circumferential.   4. The mitral valve is grossly normal. Trivial mitral valve  regurgitation.   5. The aortic valve was not well visualized. Aortic valve regurgitation  is not visualized. No aortic stenosis is present.   6. IVC is poorly visualized, but does not appear dilated.    Cardiac MRI 07/20/23:  IMPRESSION: 1. Initial study on 07/20/23 was very poor quality. She was brought back for repeat study on 07/21/23. Issue on initial study was that EKG gating was done but  scanner was having difficulty detecting QRS complex due to low voltage, leading to inaccurate gating. For second study, switched to pulse triggering using pulse oximeter with significant improvement in image quality   2. Large pericardial effusion measuring up to 4cm adjacent to LV lateral wall. No evidence of ventricular interdependence on real time imaging   3. Myocardial T2 values are diffusely elevated, suggesting significant myocardial edema. In addition, there is marked elevation of ECV (40%) and patchy midwall LGE   4. Small biventricular size with normal systolic function. Asymmetric LV hypertrophy measuring 14mm in basal septum (10mm in posterior wall), not meeting criteria for HCM (<46mm)   5. Suspect findings (elevated T1/T2/ECV, LGE, pericardial effusion) are due to severe hypothyroidism. ECV however is higher than typically expected, and is on the border of the expected range for amyloid. Early amyloid is on the differential given the LVH, ECV, and LGE findings, though suspect more likely hypothyroidism as cause. Recommend checking SPEP/UPEP/light chains and PYP scan to evaluate for cardiac amyloid, but if unremarkable suspect hypothyroidism as etiology of findings and would suggest repeating cardiac MRI once thyroid  disease has been treated    Patient  Profile   43 y.o. 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 Kennyth, OK to start Reviewed meds, d/w RPH K+ 4.1 Mag 1.9 Creat 0.94 (stable) QTC is stable  Would like like to repeat c.MRI this admission >> though she declines Reduce Toprol  back to 25mg  daily  Dr. Kennyth has seen the patient this AM Pending her 5th dose this morning > then 2 hour EKG, will review and make attending team aware if stable/ready for discharge EP f/u is in place   hypothyroid she reports excellent medication compliance, following with her PMD for thyroid  management.  Of late her numbers have not been perfect they continue to work on it   TSH has come back 14.9 09/27/24 here Deferred to IM management   3. Pericardial effusion Described as small on this admission's echo  For questions or updates, please contact [facility] Please consult [email or web address] for contact info under    Signed, Charlies Macario Arthur, PA-C  09/29/2024, 10:08 AM    I have seen, examined the patient, and reviewed the above assessment and plan.    Interval: No acute overnight events. Patient reports feeling relatively well. No new or acute complaints.   General: Well developed, in no acute distress.  Neck: No JVD.  Cardiac: Normal rate, regular rhythm.  Resp: Normal work of breathing.  Ext: No edema.  Neuro: No gross focal deficits.  Psych: Normal affect.   Cr: 0.94 K: 4.1  EKG:  Qtc stable at ~4103ms.   Assessment:  #Monomorphic VT, idiopathic #Hypothyroidism   Plan:  - Continue sotalol  80mg  BID. She has received 5 doses under continuous cardiac  monitoring. Qtc stable at ~476ms.  - Metoprolol  XL reduced to 25mg  daily. - Continue routine monitoring of kidney function and electrolytes, replete as necessary.  - Repeat cardiac MRI as outpatient.  - Appreciate medicine assistance in management of thyroid  disease.   Fonda Kennyth, MD 09/29/2024 3:44 PM

## 2024-10-02 ENCOUNTER — Telehealth: Payer: Self-pay

## 2024-10-02 NOTE — Transitions of Care (Post Inpatient/ED Visit) (Unsigned)
   10/02/2024  Name: Joanne Harris MRN: [id number] DOB: 1981/05/05  Today's TOC FU Call Status: Today's TOC FU Call Status:: Unsuccessful Call (1st Attempt) Unsuccessful Call (1st Attempt) Date: 10/02/24  Attempted to reach the patient regarding the most recent Inpatient/ED visit.  Follow Up Plan: Additional outreach attempts will be made to reach the patient to complete the Transitions of Care (Post Inpatient/ED visit) call.   Signature  Charmaine Bloodgood, LPN [facility] Health Advisor [place] [facility] You Are. We Are. [facility] Direct Dial [phone]

## 2024-10-03 NOTE — Transitions of Care (Post Inpatient/ED Visit) (Unsigned)
   10/03/2024  Name: Joanne Harris MRN: [id number] DOB: [birth year]  Today's TOC FU Call Status: Today's TOC FU Call Status:: Unsuccessful Call (2nd Attempt) Unsuccessful Call (1st Attempt) Date: 10/02/24 Unsuccessful Call (2nd Attempt) Date: 10/03/24  Attempted to reach the patient regarding the most recent Inpatient/ED visit.  Follow Up Plan: Additional outreach attempts will be made to reach the patient to complete the Transitions of Care (Post Inpatient/ED visit) call.   Signature Julian Lemmings, LPN [facility] Nurse Health Advisor Direct Dial [phone]

## 2024-10-04 NOTE — Transitions of Care (Post Inpatient/ED Visit) (Signed)
° °  10/04/2024  Name: Joanne Harris MRN: [id number] DOB: [birth year]  Today's TOC FU Call Status: Today's TOC FU Call Status:: Unsuccessful Call (3rd Attempt) Unsuccessful Call (1st Attempt) Date: 10/02/24 Unsuccessful Call (2nd Attempt) Date: 10/03/24 Unsuccessful Call (3rd Attempt) Date: 10/04/24  Attempted to reach the patient regarding the most recent Inpatient/ED visit.  Follow Up Plan: No further outreach attempts will be made at this time. We have been unable to contact the patient.  Signature Julian Lemmings, LPN [facility] Nurse Health Advisor Direct Dial [phone]

## 2024-10-05 ENCOUNTER — Ambulatory Visit: Admitting: Physician Assistant

## 2024-10-06 ENCOUNTER — Ambulatory Visit: Attending: Physician Assistant | Admitting: Physician Assistant

## 2024-10-06 VITALS — BP 124/82 | HR 78 | Ht 64.5 in | Wt 285.0 lb

## 2024-10-06 DIAGNOSIS — I472 Ventricular tachycardia, unspecified: Secondary | ICD-10-CM | POA: Diagnosis not present

## 2024-10-06 DIAGNOSIS — Z5181 Encounter for therapeutic drug level monitoring: Secondary | ICD-10-CM | POA: Diagnosis not present

## 2024-10-06 DIAGNOSIS — Z79899 Other long term (current) drug therapy: Secondary | ICD-10-CM

## 2024-10-06 MED ORDER — SOTALOL HCL 80 MG PO TABS: 80.0000 mg | ORAL_TABLET | Freq: Two times a day (BID) | ORAL | 2 refills | Status: AC

## 2024-10-06 NOTE — Progress Notes (Signed)
 Cardiology Office Note:  .   Date:  10/06/2024  ID:  Joanne Harris, DOB [birth year], MRN [id number] PCP: Mahlon Comer BRAVO, MD  [facility] Providers Cardiologist:  Darryle ONEIDA Decent, MD Electrophysiologist:  Fonda Kitty, MD {  History of Present Illness: .   Joanne Harris is a 43 y.o. 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  Her day went as usual, feeling well though suddenly in later afternoon started to feel a bit winded, heavy in her chest and noted fast HR, seemed to at firs come/go, though then persisted. Given her hx, she came in Found in VT and did get cardioverted successfully to SR No glaring lab derangements Transferred to [facility] for admission and further w/u Started on sotalol  Did well, no recurrent VT and improved PVC burden Discharged 09/29/24  Today's visit is scheduled as her 1 week post sotalol  appt ROS:   She is doing well No symptoms of her VT Tolerating sotalol  well Infrequent flutter before, since her hospitalizations Did not really have a overt awareness of the PVCs  No near syncope or syncope No CP, SOB   Arrhythmia/AAD hx VT Sotalol  started Dec 2025  Studies Reviewed: SABRA    EKG done today and reviewed by myself:  SR 78bpm, QTc , no PVCs  09/26/24: TTE 1. Left ventricular ejection fraction, by estimation, is 60 to 65%. The  left ventricle has normal function. The left ventricle has no regional  wall motion abnormalities. There is mild concentric left ventricular  hypertrophy. Left ventricular diastolic  parameters were normal.   2. Right ventricular systolic function is normal. The right ventricular  size is normal.   3. A small pericardial effusion is present. The pericardial effusion is  circumferential. There is no evidence of cardiac tamponade.   4.  The mitral valve is normal in structure. No evidence of mitral valve  regurgitation. No evidence of mitral stenosis.   5. The aortic valve is normal in structure. Aortic valve regurgitation is  not visualized. No aortic stenosis is present.   6. The inferior vena cava is normal in size with greater than 50%  respiratory variability, suggesting right atrial pressure of 3 mmHg.      Monitor 1 HR 72 - 113, average 79 bpm. No atrial fibrillation detected. Rare supraventricular ectopy. Rare ventricular ectopy. No sustained arrhythmias. Symptom trigger episodes correspond to normal sinus rhythm.   Monitor 2 HR 66 - 143, average 75 bpm. 13 nonsustained VT (longest 5 beats) No atrial fibrillation detected. Rare supraventricular ectopy. Frequent ventricular ectopy, 5%. No sustained arrhythmias. Symptom trigger episodes correspond to PVCs.   RHC/LHC 07/19/23:  Conclusions: No angiographically significant coronary artery disease. Mildly to moderately elevated left heart and pulmonary artery pressures. Severely elevated right heart filling pressures. Normal Fick cardiac output/index. Hemodynamics with equivocal findings of cardiac tamponade (absent y-descent noted on RA pressure tracing, elevated right heart pressures not completely equalized with left heart pressures, and concordant LV/RV pressures). Recommendations: Proceed with cardiac MRI. Favor deferring drainage of pericardial effusion at this time, given lack of symptoms and equivocal hemodynamic findings of tamponade physiology. Primary prevention of coronary artery disease.   Echo 07/17/23:  1. Limited echo windows- Definity  contrast was not administered. Left  ventricular ejection fraction, by estimation, is 60 to 65%. The left  ventricle has normal function.  The left ventricle has no regional wall  motion abnormalities. There is moderate  left ventricular hypertrophy. Left ventricular diastolic parameters are  consistent with  Grade I diastolic dysfunction (impaired relaxation).   2. Poorly visualized, appears underfilled. RA and RV collapse noted. RVEF  is reduced. Right ventricular systolic function is moderately reduced. The  right ventricular size is small. There is normal pulmonary artery systolic  pressure. The estimated right   ventricular systolic pressure is 11.7 mmHg.   3. Cannot rule-out tamponade physiology. Large pericardial effusion. The  pericardial effusion is circumferential.   4. The mitral valve is grossly normal. Trivial mitral valve  regurgitation.   5. The aortic valve was not well visualized. Aortic valve regurgitation  is not visualized. No aortic stenosis is present.   6. IVC is poorly visualized, but does not appear dilated.    Cardiac MRI 07/20/23:  IMPRESSION: 1. Initial study on 07/20/23 was very poor quality. She was brought back for repeat study on 07/21/23. Issue on initial study was that EKG gating was done but scanner was having difficulty detecting QRS complex due to low voltage, leading to inaccurate gating. For second study, switched to pulse triggering using pulse oximeter with significant improvement in image quality   2. Large pericardial effusion measuring up to 4cm adjacent to LV lateral wall. No evidence of ventricular interdependence on real time imaging   3. Myocardial T2 values are diffusely elevated, suggesting significant myocardial edema. In addition, there is marked elevation of ECV (40%) and patchy midwall LGE   4. Small biventricular size with normal systolic function. Asymmetric LV hypertrophy measuring 14mm in basal septum (10mm in posterior wall), not meeting criteria for HCM (<5mm)   5. Suspect findings (elevated T1/T2/ECV, LGE, pericardial effusion) are due to severe hypothyroidism. ECV however is higher than typically expected, and is on the border of the expected range for amyloid. Early amyloid is on the differential given the LVH, ECV, and  LGE findings, though suspect more likely hypothyroidism as cause. Recommend checking SPEP/UPEP/light chains and PYP scan to evaluate for cardiac amyloid, but if unremarkable suspect hypothyroidism as etiology of findings and would suggest repeating cardiac MRI once thyroid  disease has been treated   Risk Assessment/Calculations:    Physical Exam:   VS:  There were no vitals taken for this visit.   Wt Readings from Last 3 Encounters:  09/29/24 281 lb 9.6 oz (127.7 kg)  07/27/24 289 lb (131.1 kg)  04/14/24 280 lb 6.4 oz (127.2 kg)    GEN: Well nourished, well developed in no acute distress NECK: No JVD; No carotid bruits CARDIAC: RRR, no murmurs, rubs, gallops RESPIRATORY:   CTA b/l without rales, wheezing or rhonchi  ABDOMEN: Soft, non-tender, non-distended EXTREMITIES:  No edema; No deformity   ASSESSMENT AND PLAN: .    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   Pericardial effusion Described as small on 09/26/24 echo  Dispo: back in 3-4 weeks, sooner if needed  Signed, Charlies Macario Arthur, PA-C

## 2024-10-06 NOTE — Patient Instructions (Addendum)
 Medication Instructions:   Your physician recommends that you continue on your current medications as directed. Please refer to the Current Medication list given to you today.   *If you need a refill on your cardiac medications before your next appointment, please call your pharmacy*  Lab Work:   PLEASE GO DOWN STAIRS  [facility]  FIRST FLOOR   ( GET OFF ELEVATORS WALK TOWARDS WAITING AREA LAB LOCATED BY PHARMACY):  BMET AND  MAG TODAY     If you have labs (blood work) drawn today and your tests are completely normal, you will receive your results only by: MyChart Message (if you have MyChart) OR A paper copy in the mail If you have any lab test that is abnormal or we need to change your treatment, we will call you to review the results.  Testing/Procedures: NONE ORDERED  TODAY     Follow-Up: At [facility], you and your health needs are our priority.  As part of our continuing mission to provide you with exceptional heart care, our providers are all part of one team.  This team includes your primary Cardiologist (physician) and Advanced Practice Providers or APPs (Physician Assistants and Nurse Practitioners) who all work together to provide you with the care you need, when you need it.  Your next appointment:   3 -4  week(s)Provider:    You may see Fonda Kitty, MD or one of the following Advanced Practice Providers on your designated Care Team:  Charlies Arthur, PA-C ( CONTACT  CASSIE HALL/ ANGELINE HAMMER FOR EP SCHEDULING ISSUES )    We recommend signing up for the patient portal called MyChart.  Sign up information is provided on this After Visit Summary.  MyChart is used to connect with patients for Virtual Visits (Telemedicine).  Patients are able to view lab/test results, encounter notes, upcoming appointments, etc.  Non-urgent messages can be sent to your provider as well.   To learn more about what you can do with MyChart, go to [email or web address].   Other  Instructions

## 2024-10-07 LAB — BASIC METABOLIC PANEL WITH GFR
BUN/Creatinine Ratio: 11 (ref 9–23)
BUN: 10 mg/dL (ref 6–24)
CO2: 20 mmol/L (ref 20–29)
Calcium: 9.5 mg/dL (ref 8.7–10.2)
Chloride: 102 mmol/L (ref 96–106)
Creatinine, Ser: 0.9 mg/dL (ref 0.57–1.00)
Glucose: 94 mg/dL (ref 70–99)
Potassium: 4.1 mmol/L (ref 3.5–5.2)
Sodium: 139 mmol/L (ref 134–144)
eGFR: 82 mL/min/1.73 (ref >59)

## 2024-10-07 LAB — MAGNESIUM: Magnesium: 1.9 mg/dL (ref 1.6–2.3)

## 2024-10-09 ENCOUNTER — Ambulatory Visit: Payer: Self-pay | Admitting: Physician Assistant

## 2024-10-25 DIAGNOSIS — E0590 Thyrotoxicosis, unspecified without thyrotoxic crisis or storm: Secondary | ICD-10-CM

## 2024-10-25 MED ORDER — LEVOTHYROXINE SODIUM 200 MCG PO TABS: 200.0000 ug | ORAL_TABLET | Freq: Every day | ORAL | 3 refills | Status: AC

## 2024-10-30 ENCOUNTER — Other Ambulatory Visit: Payer: Self-pay | Admitting: Family Medicine

## 2024-10-30 NOTE — Progress Notes (Deleted)
 " Cardiology Office Note:  .   Date:  10/30/2024  ID:  Joanne Harris, DOB Jun 18, 1981, MRN [id number] PCP: Mahlon Comer BRAVO, MD  [facility] Providers Cardiologist:  Darryle ONEIDA Decent, MD Electrophysiologist:  Fonda Kitty, MD {  History of Present Illness: .   Joanne Harris is a 44 y.o. 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  Her day went as usual, feeling well though suddenly in later afternoon started to feel a bit winded, heavy in her chest and noted fast HR, seemed to at firs come/go, though then persisted. Given her hx, she came in Found in VT and did get cardioverted successfully to SR No glaring lab derangements Transferred to [facility] for admission and further w/u Started on sotalol  Did well, no recurrent VT and improved PVC burden Discharged 09/29/24  I saw her 10/06/24 She is doing well No symptoms of her VT Tolerating sotalol  well Infrequent flutter before, since her hospitalizations Did not really have a overt awareness of the PVCs  No near syncope or syncope No CP, SOB  Today's visit is scheduled as her 1 mo post sotalol  appt ROS:   *** sotalol  EKG, Labs *** symptoms, PVCs, VT *** thyroid    Arrhythmia/AAD hx VT Sotalol  started Dec 2025  Studies Reviewed: SABRA    EKG done today and reviewed by myself:  ***  09/26/24: TTE 1. Left ventricular ejection fraction, by estimation, is 60 to 65%. The  left ventricle has normal function. The left ventricle has no regional  wall motion abnormalities. There is mild concentric left ventricular  hypertrophy. Left ventricular diastolic  parameters were normal.   2. Right ventricular systolic function is normal. The right ventricular  size is normal.   3. A small pericardial effusion is present. The pericardial effusion is   circumferential. There is no evidence of cardiac tamponade.   4. The mitral valve is normal in structure. No evidence of mitral valve  regurgitation. No evidence of mitral stenosis.   5. The aortic valve is normal in structure. Aortic valve regurgitation is  not visualized. No aortic stenosis is present.   6. The inferior vena cava is normal in size with greater than 50%  respiratory variability, suggesting right atrial pressure of 3 mmHg.      Monitor 1 HR 72 - 113, average 79 bpm. No atrial fibrillation detected. Rare supraventricular ectopy. Rare ventricular ectopy. No sustained arrhythmias. Symptom trigger episodes correspond to normal sinus rhythm.   Monitor 2 HR 66 - 143, average 75 bpm. 13 nonsustained VT (longest 5 beats) No atrial fibrillation detected. Rare supraventricular ectopy. Frequent ventricular ectopy, 5%. No sustained arrhythmias. Symptom trigger episodes correspond to PVCs.   RHC/LHC 07/19/23:  Conclusions: No angiographically significant coronary artery disease. Mildly to moderately elevated left heart and pulmonary artery pressures. Severely elevated right heart filling pressures. Normal Fick cardiac output/index. Hemodynamics with equivocal findings of cardiac tamponade (absent y-descent noted on RA pressure tracing, elevated right heart pressures not completely equalized with left heart pressures, and concordant LV/RV pressures). Recommendations: Proceed with cardiac MRI. Favor deferring drainage of pericardial effusion at this time, given lack of symptoms and equivocal hemodynamic findings of tamponade physiology. Primary prevention of coronary artery disease.   Echo 07/17/23:  1. Limited echo windows- Definity  contrast was not administered. Left  ventricular ejection fraction, by estimation,  is 60 to 65%. The left  ventricle has normal function. The left ventricle has no regional wall  motion abnormalities. There is moderate  left ventricular  hypertrophy. Left ventricular diastolic parameters are  consistent with Grade I diastolic dysfunction (impaired relaxation).   2. Poorly visualized, appears underfilled. RA and RV collapse noted. RVEF  is reduced. Right ventricular systolic function is moderately reduced. The  right ventricular size is small. There is normal pulmonary artery systolic  pressure. The estimated right   ventricular systolic pressure is 11.7 mmHg.   3. Cannot rule-out tamponade physiology. Large pericardial effusion. The  pericardial effusion is circumferential.   4. The mitral valve is grossly normal. Trivial mitral valve  regurgitation.   5. The aortic valve was not well visualized. Aortic valve regurgitation  is not visualized. No aortic stenosis is present.   6. IVC is poorly visualized, but does not appear dilated.    Cardiac MRI 07/20/23:  IMPRESSION: 1. Initial study on 07/20/23 was very poor quality. She was brought back for repeat study on 07/21/23. Issue on initial study was that EKG gating was done but scanner was having difficulty detecting QRS complex due to low voltage, leading to inaccurate gating. For second study, switched to pulse triggering using pulse oximeter with significant improvement in image quality   2. Large pericardial effusion measuring up to 4cm adjacent to LV lateral wall. No evidence of ventricular interdependence on real time imaging   3. Myocardial T2 values are diffusely elevated, suggesting significant myocardial edema. In addition, there is marked elevation of ECV (40%) and patchy midwall LGE   4. Small biventricular size with normal systolic function. Asymmetric LV hypertrophy measuring 14mm in basal septum (10mm in posterior wall), not meeting criteria for HCM (<83mm)   5. Suspect findings (elevated T1/T2/ECV, LGE, pericardial effusion) are due to severe hypothyroidism. ECV however is higher than typically expected, and is on the border of the expected range  for amyloid. Early amyloid is on the differential given the LVH, ECV, and LGE findings, though suspect more likely hypothyroidism as cause. Recommend checking SPEP/UPEP/light chains and PYP scan to evaluate for cardiac amyloid, but if unremarkable suspect hypothyroidism as etiology of findings and would suggest repeating cardiac MRI once thyroid  disease has been treated   Risk Assessment/Calculations:    Physical Exam:   VS:  There were no vitals taken for this visit.   Wt Readings from Last 3 Encounters:  10/06/24 285 lb (129.3 kg)  09/29/24 281 lb 9.6 oz (127.7 kg)  07/27/24 289 lb (131.1 kg)    GEN: Well nourished, well developed in no acute distress NECK: No JVD; No carotid bruits CARDIAC: *** RRR, no murmurs, rubs, gallops RESPIRATORY: ***  CTA b/l without rales, wheezing or rhonchi  ABDOMEN: Soft, non-tender, non-distended EXTREMITIES: *** No edema; No deformity   ASSESSMENT AND PLAN: .    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   Pericardial effusion Described as small on 09/26/24 echo  Dispo: back in ***, sooner if needed  Signed, Joanne Macario Arthur, PA-C   "

## 2024-11-01 ENCOUNTER — Ambulatory Visit: Admitting: Physician Assistant

## 2024-11-05 NOTE — Progress Notes (Unsigned)
 " Cardiology Office Note:  .   Date:  11/05/2024  ID:  Joanne Harris, DOB [birth year], MRN [id number] PCP: Mahlon Comer BRAVO, MD  [facility] Providers Cardiologist:  Darryle ONEIDA Decent, MD Electrophysiologist:  Fonda Kitty, MD {  History of Present Illness: .   Joanne Harris is a 44 y.o. 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  Her day went as usual, feeling well though suddenly in later afternoon started to feel a bit winded, heavy in her chest and noted fast HR, seemed to at firs come/go, though then persisted. Given her hx, she came in Found in [place] and did get cardioverted successfully to SR No glaring lab derangements Transferred to [facility] for admission and further w/u Started on sotalol  Did well, no recurrent VT and improved PVC burden Discharged 09/29/24  I saw her 10/06/24 She is doing well No symptoms of her VT Tolerating sotalol  well Infrequent flutter before, since her hospitalizations Did not really have a overt awareness of the PVCs  No near syncope or syncope No CP, SOB  Today's visit is scheduled as her 1 mo post sotalol  appt ROS:   She is doing well Tolerating sotalol , with good medication compliance No palpitations or cardiac awareness No near syncope or syncope Last week once got dizzy upon standing up No CP No SOB Sees her PMD for thyroid  f/u next week   Arrhythmia/AAD hx VT Sotalol  started Dec 2025  Studies Reviewed: SABRA    EKG done today and reviewed by myself:  SR 75bpm, QTc , no PVCs  09/26/24: TTE 1. Left ventricular ejection fraction, by estimation, is 60 to 65%. The  left ventricle has normal function. The left ventricle has no regional  wall motion abnormalities. There is mild concentric left ventricular  hypertrophy. Left ventricular diastolic   parameters were normal.   2. Right ventricular systolic function is normal. The right ventricular  size is normal.   3. A small pericardial effusion is present. The pericardial effusion is  circumferential. There is no evidence of cardiac tamponade.   4. The mitral valve is normal in structure. No evidence of mitral valve  regurgitation. No evidence of mitral stenosis.   5. The aortic valve is normal in structure. Aortic valve regurgitation is  not visualized. No aortic stenosis is present.   6. The inferior vena cava is normal in size with greater than 50%  respiratory variability, suggesting right atrial pressure of 3 mmHg.      Monitor 1 HR 72 - 113, average 79 bpm. No atrial fibrillation detected. Rare supraventricular ectopy. Rare ventricular ectopy. No sustained arrhythmias. Symptom trigger episodes correspond to normal sinus rhythm.   Monitor 2 HR 66 - 143, average 75 bpm. 13 nonsustained VT (longest 5 beats) No atrial fibrillation detected. Rare supraventricular ectopy. Frequent ventricular ectopy, 5%. No sustained arrhythmias. Symptom trigger episodes correspond to PVCs.   RHC/LHC 07/19/23:  Conclusions: No angiographically significant coronary artery disease. Mildly to moderately elevated left heart and pulmonary artery pressures. Severely elevated right heart filling pressures. Normal Fick cardiac output/index. Hemodynamics with equivocal findings of cardiac tamponade (absent y-descent noted on RA pressure tracing, elevated right heart pressures not completely equalized with left heart pressures, and concordant LV/RV pressures). Recommendations: Proceed with cardiac MRI. Favor deferring drainage of pericardial effusion at this time, given lack of  symptoms and equivocal hemodynamic findings of tamponade physiology. Primary prevention of coronary artery disease.   Echo 07/17/23:  1. Limited echo windows- Definity  contrast was not administered. Left  ventricular  ejection fraction, by estimation, is 60 to 65%. The left  ventricle has normal function. The left ventricle has no regional wall  motion abnormalities. There is moderate  left ventricular hypertrophy. Left ventricular diastolic parameters are  consistent with Grade I diastolic dysfunction (impaired relaxation).   2. Poorly visualized, appears underfilled. RA and RV collapse noted. RVEF  is reduced. Right ventricular systolic function is moderately reduced. The  right ventricular size is small. There is normal pulmonary artery systolic  pressure. The estimated right   ventricular systolic pressure is 11.7 mmHg.   3. Cannot rule-out tamponade physiology. Large pericardial effusion. The  pericardial effusion is circumferential.   4. The mitral valve is grossly normal. Trivial mitral valve  regurgitation.   5. The aortic valve was not well visualized. Aortic valve regurgitation  is not visualized. No aortic stenosis is present.   6. IVC is poorly visualized, but does not appear dilated.    Cardiac MRI 07/20/23:  IMPRESSION: 1. Initial study on 07/20/23 was very poor quality. She was brought back for repeat study on 07/21/23. Issue on initial study was that EKG gating was done but scanner was having difficulty detecting QRS complex due to low voltage, leading to inaccurate gating. For second study, switched to pulse triggering using pulse oximeter with significant improvement in image quality   2. Large pericardial effusion measuring up to 4cm adjacent to LV lateral wall. No evidence of ventricular interdependence on real time imaging   3. Myocardial T2 values are diffusely elevated, suggesting significant myocardial edema. In addition, there is marked elevation of ECV (40%) and patchy midwall LGE   4. Small biventricular size with normal systolic function. Asymmetric LV hypertrophy measuring 14mm in basal septum (10mm in posterior wall), not meeting criteria for HCM (<35mm)   5.  Suspect findings (elevated T1/T2/ECV, LGE, pericardial effusion) are due to severe hypothyroidism. ECV however is higher than typically expected, and is on the border of the expected range for amyloid. Early amyloid is on the differential given the LVH, ECV, and LGE findings, though suspect more likely hypothyroidism as cause. Recommend checking SPEP/UPEP/light chains and PYP scan to evaluate for cardiac amyloid, but if unremarkable suspect hypothyroidism as etiology of findings and would suggest repeating cardiac MRI once thyroid  disease has been treated   Risk Assessment/Calculations:    Physical Exam:   VS:  There were no vitals taken for this visit.   Wt Readings from Last 3 Encounters:  10/06/24 285 lb (129.3 kg)  09/29/24 281 lb 9.6 oz (127.7 kg)  07/27/24 289 lb (131.1 kg)    GEN: Well nourished, well developed in no acute distress NECK: No JVD; No carotid bruits CARDIAC: *** RRR, no murmurs, rubs, gallops RESPIRATORY: ***  CTA b/l without rales, wheezing or rhonchi  ABDOMEN: Soft, non-tender, non-distended EXTREMITIES: *** No edema; No deformity   ASSESSMENT AND PLAN: .    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   Pericardial effusion Described as small on 09/26/24 echo  Dispo: back in 32mo, sooner if needed  Signed, Charlies Macario Arthur, PA-C   "

## 2024-11-06 ENCOUNTER — Ambulatory Visit: Attending: Physician Assistant | Admitting: Physician Assistant

## 2024-11-06 VITALS — BP 132/94 | HR 75 | Ht 64.5 in | Wt 302.0 lb

## 2024-11-06 DIAGNOSIS — I472 Ventricular tachycardia, unspecified: Secondary | ICD-10-CM | POA: Diagnosis not present

## 2024-11-06 DIAGNOSIS — I3139 Other pericardial effusion (noninflammatory): Secondary | ICD-10-CM

## 2024-11-06 DIAGNOSIS — Z5181 Encounter for therapeutic drug level monitoring: Secondary | ICD-10-CM | POA: Diagnosis not present

## 2024-11-06 DIAGNOSIS — Z79899 Other long term (current) drug therapy: Secondary | ICD-10-CM

## 2024-11-06 NOTE — Patient Instructions (Addendum)
 Medication Instructions:   Your physician recommends that you continue on your current medications as directed. Please refer to the Current Medication list given to you today.  *If you need a refill on your cardiac medications before your next appointment, please call your pharmacy*  Lab Work:  PLEASE GO DOWN STAIRS  [facility]  FIRST FLOOR   ( GET OFF ELEVATORS WALK TOWARDS WAITING AREA LAB LOCATED BY PHARMACY):  BMET  AND  MAG TODAY     If you have labs (blood work) drawn today and your tests are completely normal, you will receive your results only by: MyChart Message (if you have MyChart) OR A paper copy in the mail If you have any lab test that is abnormal or we need to change your treatment, we will call you to review the results.   Testing/Procedures: NONE ORDERED  TODAY    Follow-Up: At [facility], you and your health needs are our priority.  As part of our continuing mission to provide you with exceptional heart care, our providers are all part of one team.  This team includes your primary Cardiologist (physician) and Advanced Practice Providers or APPs (Physician Assistants and Nurse Practitioners) who all work together to provide you with the care you need, when you need it.  Your next appointment:   4 month(s)  Provider:   Fonda Kitty, MD or Charlies Arthur, PA-C  ( CONTACT  CASSIE HALL/ ANGELINE HAMMER FOR EP SCHEDULING ISSUES )    We recommend signing up for the patient portal called MyChart.  Sign up information is provided on this After Visit Summary.  MyChart is used to connect with patients for Virtual Visits (Telemedicine).  Patients are able to view lab/test results, encounter notes, upcoming appointments, etc.  Non-urgent messages can be sent to your provider as well.   To learn more about what you can do with MyChart, go to [email or web address].   Other Instructions

## 2024-11-07 ENCOUNTER — Ambulatory Visit: Payer: Self-pay | Admitting: Physician Assistant

## 2024-11-07 LAB — BASIC METABOLIC PANEL WITH GFR
BUN/Creatinine Ratio: 20 (ref 9–23)
BUN: 14 mg/dL (ref 6–24)
CO2: 21 mmol/L (ref 20–29)
Calcium: 9.1 mg/dL (ref 8.7–10.2)
Chloride: 100 mmol/L (ref 96–106)
Creatinine, Ser: 0.69 mg/dL (ref 0.57–1.00)
Glucose: 173 mg/dL — ABNORMAL HIGH (ref 70–99)
Potassium: 4 mmol/L (ref 3.5–5.2)
Sodium: 137 mmol/L (ref 134–144)
eGFR: 111 mL/min/1.73

## 2024-11-07 LAB — MAGNESIUM: Magnesium: 1.8 mg/dL (ref 1.6–2.3)

## 2024-11-08 ENCOUNTER — Ambulatory Visit: Admitting: Family Medicine

## 2024-11-14 ENCOUNTER — Ambulatory Visit: Admitting: Family Medicine

## 2024-11-17 ENCOUNTER — Ambulatory Visit: Admitting: Family Medicine

## 2024-11-24 ENCOUNTER — Ambulatory Visit: Admitting: Family Medicine

## 2024-12-08 ENCOUNTER — Ambulatory Visit: Admitting: Family Medicine

## 2024-12-20 ENCOUNTER — Ambulatory Visit: Admitting: Psychology

## 2025-03-06 ENCOUNTER — Ambulatory Visit: Admitting: Physician Assistant
